# Patient Record
Sex: Female | Born: 1980 | Race: Black or African American | Hispanic: No | Marital: Married | Smoking: Never smoker
Health system: Southern US, Community
[De-identification: ages and names within clinical notes are randomized; demographics above are authoritative.]

## PROBLEM LIST (undated history)

## (undated) DIAGNOSIS — G43009 Migraine without aura, not intractable, without status migrainosus: Secondary | ICD-10-CM

## (undated) DIAGNOSIS — E785 Hyperlipidemia, unspecified: Secondary | ICD-10-CM

## (undated) DIAGNOSIS — K649 Unspecified hemorrhoids: Secondary | ICD-10-CM

## (undated) DIAGNOSIS — I10 Essential (primary) hypertension: Secondary | ICD-10-CM

## (undated) DIAGNOSIS — J309 Allergic rhinitis, unspecified: Secondary | ICD-10-CM

## (undated) DIAGNOSIS — R0602 Shortness of breath: Secondary | ICD-10-CM

## (undated) DIAGNOSIS — I499 Cardiac arrhythmia, unspecified: Secondary | ICD-10-CM

## (undated) DIAGNOSIS — L259 Unspecified contact dermatitis, unspecified cause: Secondary | ICD-10-CM

## (undated) DIAGNOSIS — F329 Major depressive disorder, single episode, unspecified: Secondary | ICD-10-CM

## (undated) DIAGNOSIS — T7840XA Allergy, unspecified, initial encounter: Secondary | ICD-10-CM

## (undated) DIAGNOSIS — D62 Acute posthemorrhagic anemia: Secondary | ICD-10-CM

## (undated) DIAGNOSIS — F411 Generalized anxiety disorder: Secondary | ICD-10-CM

## (undated) DIAGNOSIS — R12 Heartburn: Secondary | ICD-10-CM

## (undated) DIAGNOSIS — O26899 Other specified pregnancy related conditions, unspecified trimester: Secondary | ICD-10-CM

## (undated) DIAGNOSIS — K602 Anal fissure, unspecified: Secondary | ICD-10-CM

## (undated) HISTORY — DX: Allergy, unspecified, initial encounter: T78.40XA

## (undated) HISTORY — DX: Migraine without aura, not intractable, without status migrainosus: G43.009

## (undated) HISTORY — DX: Unspecified contact dermatitis, unspecified cause: L25.9

## (undated) HISTORY — DX: Unspecified hemorrhoids: K64.9

## (undated) HISTORY — DX: Generalized anxiety disorder: F41.1

## (undated) HISTORY — DX: Major depressive disorder, single episode, unspecified: F32.9

## (undated) HISTORY — DX: Hyperlipidemia, unspecified: E78.5

## (undated) HISTORY — DX: Anal fissure, unspecified: K60.2

## (undated) HISTORY — DX: Allergic rhinitis, unspecified: J30.9

## (undated) HISTORY — DX: Essential (primary) hypertension: I10

---

## 1998-01-15 ENCOUNTER — Emergency Department (HOSPITAL_COMMUNITY): Admission: EM | Admit: 1998-01-15 | Discharge: 1998-01-15 | Payer: Self-pay | Admitting: Emergency Medicine

## 1999-01-08 ENCOUNTER — Other Ambulatory Visit: Admission: RE | Admit: 1999-01-08 | Discharge: 1999-01-08 | Payer: Self-pay | Admitting: Internal Medicine

## 2000-01-10 ENCOUNTER — Other Ambulatory Visit: Admission: RE | Admit: 2000-01-10 | Discharge: 2000-01-10 | Payer: Self-pay | Admitting: Internal Medicine

## 2000-09-18 ENCOUNTER — Encounter: Payer: Self-pay | Admitting: Internal Medicine

## 2000-09-18 ENCOUNTER — Ambulatory Visit (HOSPITAL_COMMUNITY): Admission: RE | Admit: 2000-09-18 | Discharge: 2000-09-18 | Payer: Self-pay | Admitting: Internal Medicine

## 2000-12-22 ENCOUNTER — Other Ambulatory Visit: Admission: RE | Admit: 2000-12-22 | Discharge: 2000-12-22 | Payer: Self-pay | Admitting: Internal Medicine

## 2001-03-16 ENCOUNTER — Emergency Department (HOSPITAL_COMMUNITY): Admission: EM | Admit: 2001-03-16 | Discharge: 2001-03-16 | Payer: Self-pay | Admitting: Emergency Medicine

## 2001-05-21 ENCOUNTER — Other Ambulatory Visit: Admission: RE | Admit: 2001-05-21 | Discharge: 2001-05-21 | Payer: Self-pay | Admitting: Internal Medicine

## 2001-11-09 ENCOUNTER — Other Ambulatory Visit: Admission: RE | Admit: 2001-11-09 | Discharge: 2001-11-09 | Payer: Self-pay | Admitting: Internal Medicine

## 2003-01-15 ENCOUNTER — Emergency Department (HOSPITAL_COMMUNITY): Admission: EM | Admit: 2003-01-15 | Discharge: 2003-01-15 | Payer: Self-pay | Admitting: Emergency Medicine

## 2003-04-09 ENCOUNTER — Emergency Department (HOSPITAL_COMMUNITY): Admission: EM | Admit: 2003-04-09 | Discharge: 2003-04-10 | Payer: Self-pay | Admitting: Emergency Medicine

## 2003-10-21 ENCOUNTER — Ambulatory Visit: Payer: Self-pay | Admitting: Internal Medicine

## 2003-12-24 ENCOUNTER — Ambulatory Visit: Payer: Self-pay | Admitting: Internal Medicine

## 2004-08-02 ENCOUNTER — Ambulatory Visit: Payer: Self-pay | Admitting: Internal Medicine

## 2005-04-18 ENCOUNTER — Emergency Department (HOSPITAL_COMMUNITY): Admission: EM | Admit: 2005-04-18 | Discharge: 2005-04-18 | Payer: Self-pay | Admitting: Emergency Medicine

## 2006-08-18 ENCOUNTER — Ambulatory Visit: Payer: Self-pay | Admitting: Endocrinology

## 2007-04-03 ENCOUNTER — Ambulatory Visit: Payer: Self-pay | Admitting: Internal Medicine

## 2007-04-03 DIAGNOSIS — F329 Major depressive disorder, single episode, unspecified: Secondary | ICD-10-CM

## 2007-04-03 DIAGNOSIS — H1030 Unspecified acute conjunctivitis, unspecified eye: Secondary | ICD-10-CM | POA: Insufficient documentation

## 2007-04-03 DIAGNOSIS — J309 Allergic rhinitis, unspecified: Secondary | ICD-10-CM

## 2007-04-03 DIAGNOSIS — F3289 Other specified depressive episodes: Secondary | ICD-10-CM

## 2007-04-03 DIAGNOSIS — F321 Major depressive disorder, single episode, moderate: Secondary | ICD-10-CM | POA: Insufficient documentation

## 2007-04-03 DIAGNOSIS — I10 Essential (primary) hypertension: Secondary | ICD-10-CM | POA: Insufficient documentation

## 2007-04-03 DIAGNOSIS — F411 Generalized anxiety disorder: Secondary | ICD-10-CM

## 2007-04-03 DIAGNOSIS — J069 Acute upper respiratory infection, unspecified: Secondary | ICD-10-CM | POA: Insufficient documentation

## 2007-04-03 DIAGNOSIS — H6120 Impacted cerumen, unspecified ear: Secondary | ICD-10-CM

## 2007-04-03 HISTORY — DX: Essential (primary) hypertension: I10

## 2007-04-03 HISTORY — DX: Allergic rhinitis, unspecified: J30.9

## 2007-04-03 HISTORY — DX: Major depressive disorder, single episode, unspecified: F32.9

## 2007-04-03 HISTORY — DX: Generalized anxiety disorder: F41.1

## 2007-04-03 HISTORY — DX: Other specified depressive episodes: F32.89

## 2007-04-12 ENCOUNTER — Telehealth (INDEPENDENT_AMBULATORY_CARE_PROVIDER_SITE_OTHER): Payer: Self-pay | Admitting: *Deleted

## 2007-04-19 ENCOUNTER — Telehealth: Payer: Self-pay | Admitting: Internal Medicine

## 2007-05-02 ENCOUNTER — Telehealth (INDEPENDENT_AMBULATORY_CARE_PROVIDER_SITE_OTHER): Payer: Self-pay | Admitting: *Deleted

## 2007-05-17 ENCOUNTER — Telehealth: Payer: Self-pay | Admitting: Internal Medicine

## 2007-05-18 ENCOUNTER — Ambulatory Visit: Payer: Self-pay | Admitting: Internal Medicine

## 2007-05-18 DIAGNOSIS — J019 Acute sinusitis, unspecified: Secondary | ICD-10-CM

## 2007-05-31 ENCOUNTER — Telehealth (INDEPENDENT_AMBULATORY_CARE_PROVIDER_SITE_OTHER): Payer: Self-pay | Admitting: *Deleted

## 2007-06-29 ENCOUNTER — Telehealth (INDEPENDENT_AMBULATORY_CARE_PROVIDER_SITE_OTHER): Payer: Self-pay | Admitting: *Deleted

## 2007-09-21 ENCOUNTER — Ambulatory Visit: Payer: Self-pay | Admitting: Internal Medicine

## 2007-09-21 ENCOUNTER — Encounter (INDEPENDENT_AMBULATORY_CARE_PROVIDER_SITE_OTHER): Payer: Self-pay | Admitting: *Deleted

## 2007-09-21 ENCOUNTER — Telehealth (INDEPENDENT_AMBULATORY_CARE_PROVIDER_SITE_OTHER): Payer: Self-pay | Admitting: *Deleted

## 2007-09-25 ENCOUNTER — Telehealth (INDEPENDENT_AMBULATORY_CARE_PROVIDER_SITE_OTHER): Payer: Self-pay | Admitting: *Deleted

## 2007-09-26 ENCOUNTER — Telehealth (INDEPENDENT_AMBULATORY_CARE_PROVIDER_SITE_OTHER): Payer: Self-pay | Admitting: *Deleted

## 2007-09-26 ENCOUNTER — Ambulatory Visit: Payer: Self-pay | Admitting: Internal Medicine

## 2007-10-17 ENCOUNTER — Ambulatory Visit: Payer: Self-pay | Admitting: Internal Medicine

## 2007-10-18 LAB — CONVERTED CEMR LAB
ALT: 18 units/L (ref 0–35)
AST: 27 units/L (ref 0–37)
Albumin: 4.1 g/dL (ref 3.5–5.2)
Alkaline Phosphatase: 50 units/L (ref 39–117)
BUN: 14 mg/dL (ref 6–23)
Basophils Absolute: 0 10*3/uL (ref 0.0–0.1)
Basophils Relative: 0.4 % (ref 0.0–3.0)
Bilirubin Urine: NEGATIVE
Bilirubin, Direct: 0.2 mg/dL (ref 0.0–0.3)
CO2: 29 meq/L (ref 19–32)
Calcium: 9.4 mg/dL (ref 8.4–10.5)
Chloride: 103 meq/L (ref 96–112)
Cholesterol: 201 mg/dL (ref 0–200)
Creatinine, Ser: 0.8 mg/dL (ref 0.4–1.2)
Direct LDL: 109.3 mg/dL
Eosinophils Absolute: 0.2 10*3/uL (ref 0.0–0.7)
Eosinophils Relative: 4.3 % (ref 0.0–5.0)
GFR calc Af Amer: 109 mL/min
GFR calc non Af Amer: 90 mL/min
Glucose, Bld: 99 mg/dL (ref 70–99)
HCT: 36.8 % (ref 36.0–46.0)
HDL: 54.8 mg/dL (ref >39.0)
Hemoglobin: 12.6 g/dL (ref 12.0–15.0)
Ketones, ur: NEGATIVE mg/dL
Leukocytes, UA: NEGATIVE
Lymphocytes Relative: 45.3 % (ref 12.0–46.0)
MCHC: 34.1 g/dL (ref 30.0–36.0)
MCV: 94.9 fL (ref 78.0–100.0)
Monocytes Absolute: 0.4 10*3/uL (ref 0.1–1.0)
Monocytes Relative: 8.2 % (ref 3.0–12.0)
Neutro Abs: 2.1 10*3/uL (ref 1.4–7.7)
Neutrophils Relative %: 41.8 % — ABNORMAL LOW (ref 43.0–77.0)
Nitrite: NEGATIVE
Platelets: 218 10*3/uL (ref 150–400)
Potassium: 3.5 meq/L (ref 3.5–5.1)
RBC: 3.88 M/uL (ref 3.87–5.11)
RDW: 12.6 % (ref 11.5–14.6)
Sodium: 139 meq/L (ref 135–145)
Specific Gravity, Urine: 1.01 (ref 1.000–1.03)
TSH: 0.88 microintl units/mL (ref 0.35–5.50)
Total Bilirubin: 0.8 mg/dL (ref 0.3–1.2)
Total CHOL/HDL Ratio: 3.7
Total Protein, Urine: NEGATIVE mg/dL
Total Protein: 7.1 g/dL (ref 6.0–8.3)
Triglycerides: 94 mg/dL (ref 0–149)
Urine Glucose: NEGATIVE mg/dL
Urobilinogen, UA: 0.2 (ref 0.0–1.0)
VLDL: 19 mg/dL (ref 0–40)
WBC: 5.1 10*3/uL (ref 4.5–10.5)
pH: 7.5 (ref 5.0–8.0)

## 2007-11-02 ENCOUNTER — Encounter: Payer: Self-pay | Admitting: Internal Medicine

## 2007-11-02 ENCOUNTER — Ambulatory Visit: Payer: Self-pay

## 2007-11-16 ENCOUNTER — Ambulatory Visit: Payer: Self-pay | Admitting: Internal Medicine

## 2008-07-04 ENCOUNTER — Inpatient Hospital Stay (HOSPITAL_COMMUNITY): Admission: AD | Admit: 2008-07-04 | Discharge: 2008-07-08 | Payer: Self-pay | Admitting: Obstetrics and Gynecology

## 2008-07-05 ENCOUNTER — Encounter (INDEPENDENT_AMBULATORY_CARE_PROVIDER_SITE_OTHER): Payer: Self-pay | Admitting: Obstetrics and Gynecology

## 2008-09-23 ENCOUNTER — Ambulatory Visit: Payer: Self-pay | Admitting: Internal Medicine

## 2008-09-23 DIAGNOSIS — R209 Unspecified disturbances of skin sensation: Secondary | ICD-10-CM | POA: Insufficient documentation

## 2008-11-19 ENCOUNTER — Ambulatory Visit: Payer: Self-pay | Admitting: Internal Medicine

## 2008-11-19 DIAGNOSIS — K6289 Other specified diseases of anus and rectum: Secondary | ICD-10-CM | POA: Insufficient documentation

## 2008-12-11 ENCOUNTER — Ambulatory Visit: Payer: Self-pay | Admitting: Internal Medicine

## 2008-12-12 LAB — CONVERTED CEMR LAB
ALT: 12 units/L (ref 0–35)
AST: 18 units/L (ref 0–37)
Albumin: 4.1 g/dL (ref 3.5–5.2)
Alkaline Phosphatase: 46 units/L (ref 39–117)
BUN: 12 mg/dL (ref 6–23)
Basophils Absolute: 0 10*3/uL (ref 0.0–0.1)
Basophils Relative: 0.2 % (ref 0.0–3.0)
Bilirubin Urine: NEGATIVE
Bilirubin, Direct: 0.1 mg/dL (ref 0.0–0.3)
CO2: 31 meq/L (ref 19–32)
Calcium: 9.2 mg/dL (ref 8.4–10.5)
Chloride: 101 meq/L (ref 96–112)
Cholesterol: 196 mg/dL (ref 0–200)
Creatinine, Ser: 0.8 mg/dL (ref 0.4–1.2)
Eosinophils Absolute: 0.1 10*3/uL (ref 0.0–0.7)
Eosinophils Relative: 1.5 % (ref 0.0–5.0)
GFR calc non Af Amer: 107.78 mL/min (ref >60)
Glucose, Bld: 87 mg/dL (ref 70–99)
HCT: 36.3 % (ref 36.0–46.0)
HDL: 49.1 mg/dL (ref >39.00)
Hemoglobin, Urine: NEGATIVE
Hemoglobin: 12.4 g/dL (ref 12.0–15.0)
Ketones, ur: NEGATIVE mg/dL
LDL Cholesterol: 135 mg/dL — ABNORMAL HIGH (ref 0–99)
Leukocytes, UA: NEGATIVE
Lymphocytes Relative: 20.5 % (ref 12.0–46.0)
Lymphs Abs: 1.4 10*3/uL (ref 0.7–4.0)
MCHC: 34.2 g/dL (ref 30.0–36.0)
MCV: 94.3 fL (ref 78.0–100.0)
Monocytes Absolute: 0.5 10*3/uL (ref 0.1–1.0)
Monocytes Relative: 7.1 % (ref 3.0–12.0)
Neutro Abs: 4.9 10*3/uL (ref 1.4–7.7)
Neutrophils Relative %: 70.7 % (ref 43.0–77.0)
Nitrite: NEGATIVE
Platelets: 203 10*3/uL (ref 150.0–400.0)
Potassium: 3.2 meq/L — ABNORMAL LOW (ref 3.5–5.1)
RBC: 3.85 M/uL — ABNORMAL LOW (ref 3.87–5.11)
RDW: 12.7 % (ref 11.5–14.6)
Sodium: 139 meq/L (ref 135–145)
Specific Gravity, Urine: 1.025 (ref 1.000–1.030)
TSH: 0.69 microintl units/mL (ref 0.35–5.50)
Total Bilirubin: 1 mg/dL (ref 0.3–1.2)
Total CHOL/HDL Ratio: 4
Total Protein, Urine: NEGATIVE mg/dL
Total Protein: 7.1 g/dL (ref 6.0–8.3)
Triglycerides: 59 mg/dL (ref 0.0–149.0)
Urine Glucose: NEGATIVE mg/dL
Urobilinogen, UA: 0.2 (ref 0.0–1.0)
VLDL: 11.8 mg/dL (ref 0.0–40.0)
WBC: 6.9 10*3/uL (ref 4.5–10.5)
pH: 6 (ref 5.0–8.0)

## 2009-01-06 ENCOUNTER — Telehealth: Payer: Self-pay | Admitting: Internal Medicine

## 2009-03-19 ENCOUNTER — Ambulatory Visit: Payer: Self-pay | Admitting: Internal Medicine

## 2009-03-19 DIAGNOSIS — L259 Unspecified contact dermatitis, unspecified cause: Secondary | ICD-10-CM

## 2009-03-19 DIAGNOSIS — L309 Dermatitis, unspecified: Secondary | ICD-10-CM

## 2009-03-19 HISTORY — DX: Unspecified contact dermatitis, unspecified cause: L25.9

## 2009-03-24 ENCOUNTER — Encounter: Payer: Self-pay | Admitting: Internal Medicine

## 2009-03-24 ENCOUNTER — Telehealth: Payer: Self-pay | Admitting: Internal Medicine

## 2009-09-03 ENCOUNTER — Ambulatory Visit: Payer: Self-pay | Admitting: Internal Medicine

## 2009-09-16 ENCOUNTER — Telehealth: Payer: Self-pay | Admitting: Internal Medicine

## 2009-09-22 ENCOUNTER — Telehealth: Payer: Self-pay | Admitting: Internal Medicine

## 2009-12-09 ENCOUNTER — Ambulatory Visit: Payer: Self-pay | Admitting: Internal Medicine

## 2009-12-09 DIAGNOSIS — E785 Hyperlipidemia, unspecified: Secondary | ICD-10-CM | POA: Insufficient documentation

## 2009-12-09 DIAGNOSIS — M6740 Ganglion, unspecified site: Secondary | ICD-10-CM | POA: Insufficient documentation

## 2009-12-09 DIAGNOSIS — G43009 Migraine without aura, not intractable, without status migrainosus: Secondary | ICD-10-CM | POA: Insufficient documentation

## 2009-12-09 HISTORY — DX: Hyperlipidemia, unspecified: E78.5

## 2009-12-09 HISTORY — DX: Migraine without aura, not intractable, without status migrainosus: G43.009

## 2009-12-17 ENCOUNTER — Ambulatory Visit: Payer: Self-pay | Admitting: Internal Medicine

## 2010-01-01 LAB — CONVERTED CEMR LAB
AST: 19 units/L (ref 0–37)
Albumin: 4.2 g/dL (ref 3.5–5.2)
BUN: 14 mg/dL (ref 6–23)
Basophils Relative: 0.6 % (ref 0.0–3.0)
Bilirubin Urine: NEGATIVE
Cholesterol: 196 mg/dL (ref 0–200)
Creatinine, Ser: 0.6 mg/dL (ref 0.4–1.2)
Eosinophils Absolute: 0.1 10*3/uL (ref 0.0–0.7)
Eosinophils Relative: 2.4 % (ref 0.0–5.0)
GFR calc non Af Amer: 152.1 mL/min (ref >60.00)
Glucose, Bld: 77 mg/dL (ref 70–99)
HCT: 37.2 % (ref 36.0–46.0)
HDL: 49.1 mg/dL (ref >39.00)
Hemoglobin: 12.9 g/dL (ref 12.0–15.0)
Lymphs Abs: 1.4 10*3/uL (ref 0.7–4.0)
MCHC: 34.6 g/dL (ref 30.0–36.0)
MCV: 96 fL (ref 78.0–100.0)
Monocytes Absolute: 0.3 10*3/uL (ref 0.1–1.0)
Neutro Abs: 3.4 10*3/uL (ref 1.4–7.7)
Neutrophils Relative %: 65.1 % (ref 43.0–77.0)
Nitrite: NEGATIVE
Potassium: 3.6 meq/L (ref 3.5–5.1)
RBC: 3.88 M/uL (ref 3.87–5.11)
TSH: 0.96 microintl units/mL (ref 0.35–5.50)
Total Bilirubin: 0.8 mg/dL (ref 0.3–1.2)
Total Protein, Urine: NEGATIVE mg/dL
Urine Glucose: NEGATIVE mg/dL
WBC: 5.2 10*3/uL (ref 4.5–10.5)
pH: 6.5 (ref 5.0–8.0)

## 2010-02-02 NOTE — Progress Notes (Signed)
Summary: Still sick  Phone Note Call from Patient   Caller: Patient [phone] Summary of Call: Pt called stating that she is still feeling sick, pt has throat pain and is begining to lose her voice. Pt is requesting MD's advisement, does she need another ABX? Please advise Initial call taken by: Margaret Pyle, CMA,  September 22, 2009 12:00 PM  Follow-up for Phone Call        if no fever, can hold on further antibx at this time, consider throat lozenges, cont the allergy meds, and consider re-eval if not improved in another wk or so, or if fever, sinus pain, cough or chest congestion or wheezing Follow-up by: Corwin Levins MD,  September 22, 2009 12:36 PM  Additional Follow-up for Phone Call Additional follow up Details #1::        Pt informed and will re-start allergy meds and monitor for fever, cough, congestion, wheezing or sinus pain. Additional Follow-up by: Margaret Pyle, CMA,  September 22, 2009 2:35 PM

## 2010-02-02 NOTE — Medication Information (Signed)
Summary: Approved/[facility]  Approved/[facility]   Imported By: Lester [place] 04/07/2009 09:54:01  _____________________________________________________________________  External Attachment:    Type:   Image     Comment:   External Document

## 2010-02-02 NOTE — Assessment & Plan Note (Signed)
Summary: LARYINGITIS/NWS   Vital Signs:  Patient profile:   30 year old female Height:      62 inches Weight:      125.38 pounds BMI:     23.02 O2 Sat:      98 % on Room air Temp:     99.2 degrees F oral Pulse rate:   83 / minute BP sitting:   118 / 84  (left arm) Cuff size:   regular  Vitals Entered By: Zella Ball Ewing CMA (AAMA) (September 03, 2009 2:12 PM)  O2 Flow:  Room air CC: tonsils swollen and painful, lost voice, cough for 4 days/RE   CC:  tonsils swollen and painful, lost voice, and cough for 4 days/RE.  History of Present Illness: here with acute onset mild to mod ST for 4 days with fever, general weakness, slight cough after exposure to several sick contacts;  has ongong nasal allergy symptoms but well controlled recently and good complaince wit meds;  Pt denies CP, worsening sob, doe, wheezing, orthopnea, pnd, worsening LE edema, palps, dizziness or syncope  Pt denies new neuro symptoms such as headache, facial or extremity weakness  No  wt loss, night sweats, loss of appetite or other constitutional symptoms   Problems Prior to Update: 1)  Pharyngitis-acute  (ICD-462) 2)  Eczema  (ICD-692.9) 3)  Preventive Health Care  (ICD-V70.0) 4)  Sinusitis- Acute-nos  (ICD-461.9) 5)  Rectal Pain  (ICD-569.42) 6)  Paresthesia  (ICD-782.0) 7)  Sinusitis- Acute-nos  (ICD-461.9) 8)  Preventive Health Care  (ICD-V70.0) 9)  Uri  (ICD-465.9) 10)  Sinusitis- Acute-nos  (ICD-461.9) 11)  Allergic Rhinitis  (ICD-477.9) 12)  Depression  (ICD-311) 13)  Anxiety  (ICD-300.00) 14)  Cerumen Impaction, Left  (ICD-380.4) 15)  Uri  (ICD-465.9) 16)  Conjunctivitis, Acute  (ICD-372.00) 17)  Hypertension  (ICD-401.9)  Medications Prior to Update: 1)  Labetalol Hcl 300 Mg Tabs (Labetalol Hcl) .Marland Kitchen.. 1 By Mouth Three Times A Day 2)  Errin 0.35 Mg Tabs (Norethindrone (Contraceptive)) .Marland Kitchen.. 1 By Mouth Once Daily 3)  Fexofenadine Hcl 180 Mg Tabs (Fexofenadine Hcl) .Marland Kitchen.. 1po Once Daily As Needed  Allergies 4)  Daily Vitamins  Tabs (Multiple Vitamin) .Marland Kitchen.. 1 By Mouth Once Daily 5)  Hydrochlorothiazide 25 Mg Tabs (Hydrochlorothiazide) .Marland Kitchen.. 1 By Mouth Once Daily 6)  Azithromycin 250 Mg Tabs (Azithromycin) .... 2po Qd For 1 Day, Then 1po Qd For 4days, Then Stop 7)  Klor-Con 10 10 Meq Cr-Tabs (Potassium Chloride) .Marland Kitchen.. 1 By Mouth Once Daily 8)  Triamcinolone Acetonide 0.1 % Crea (Triamcinolone Acetonide) .... Use Asd Once Daily As Needed  Current Medications (verified): 1)  Labetalol Hcl 300 Mg Tabs (Labetalol Hcl) .Marland Kitchen.. 1 By Mouth Three Times A Day 2)  Errin 0.35 Mg Tabs (Norethindrone (Contraceptive)) .Marland Kitchen.. 1 By Mouth Once Daily 3)  Fexofenadine Hcl 180 Mg Tabs (Fexofenadine Hcl) .Marland Kitchen.. 1po Once Daily As Needed Allergies 4)  Daily Vitamins  Tabs (Multiple Vitamin) .Marland Kitchen.. 1 By Mouth Once Daily 5)  Hydrochlorothiazide 25 Mg Tabs (Hydrochlorothiazide) .Marland Kitchen.. 1 By Mouth Once Daily 6)  Doxycycline Hyclate 100 Mg Caps (Doxycycline Hyclate) .Marland Kitchen.. 1po Two Times A Day 7)  Klor-Con 10 10 Meq Cr-Tabs (Potassium Chloride) .Marland Kitchen.. 1 By Mouth Once Daily 8)  Triamcinolone Acetonide 0.1 % Crea (Triamcinolone Acetonide) .... Use Asd Once Daily As Needed  Allergies (verified): 1)  ! * Peanut Butter 2)  ! * Crab Legs 3)  ! * Blue Cheeze 4)  ! 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  , mild ill  Head:  normocephalic and atraumatic.   Eyes:  vision grossly intact, pupils equal, and pupils round.   Ears:  bilat tm's erythema, sinus nontender Nose:  nasal dischargemucosal  pallor and mucosal edema.   Mouth:  pharyngeal erythema and pharyngeal exudate.   Neck:  supple and cervical lymphadenopathy.   Lungs:  normal respiratory effort and normal breath sounds.   Heart:  normal rate and regular rhythm.   Extremities:  no edema, no erythema    Impression & Recommendations:  Problem # 1:  PHARYNGITIS-ACUTE (ICD-462)  Her updated medication list for this problem includes:    Doxycycline Hyclate 100 Mg Caps (Doxycycline hyclate) .Marland Kitchen... 1po two times a day severe exudative, treat as above, f/u any worsening signs or symptoms , ok for work note for tomorrow  Problem # 2:  HYPERTENSION (ICD-401.9)  Her updated medication list for this problem includes:    Labetalol Hcl 300 Mg Tabs (Labetalol hcl) .Marland Kitchen... 1 by mouth three times a day    Hydrochlorothiazide 25 Mg Tabs (Hydrochlorothiazide) .Marland Kitchen... 1 by mouth once daily  BP today: 118/84 Prior BP: 112/80 (03/19/2009)  Labs Reviewed: K+: 3.2 (12/11/2008) Creat: : 0.8 (12/11/2008)   Chol: 196 (12/11/2008)   HDL: 49.10 (12/11/2008)   LDL: 135 (12/11/2008)   TG: 59.0 (12/11/2008) stable overall by hx and exam, ok to continue meds/tx as is   Problem # 3:  ALLERGIC RHINITIS (ICD-477.9)  Her updated medication list for this problem includes:    Fexofenadine Hcl 180 Mg Tabs (Fexofenadine hcl) .Marland Kitchen... 1po once daily as needed allergies stable overall by hx and exam, ok to continue meds/tx as is   Complete Medication List: 1)  Labetalol Hcl 300 Mg Tabs (Labetalol hcl) .Marland Kitchen.. 1 by mouth three times a day 2)  Errin 0.35 Mg Tabs (Norethindrone (contraceptive)) .Marland Kitchen.. 1 by mouth once daily 3)  Fexofenadine Hcl 180 Mg Tabs (Fexofenadine hcl) .Marland Kitchen.. 1po once daily as needed allergies 4)  Daily Vitamins Tabs (Multiple vitamin) .Marland Kitchen.. 1 by mouth once daily 5)  Hydrochlorothiazide 25 Mg Tabs (Hydrochlorothiazide) .Marland Kitchen.. 1 by mouth once daily 6)  Doxycycline Hyclate 100 Mg Caps (Doxycycline hyclate) .Marland Kitchen.. 1po two times a day 7)  Klor-con 10 10  Meq Cr-tabs (Potassium chloride) .Marland Kitchen.. 1 by mouth once daily 8)  Triamcinolone Acetonide 0.1 % Crea (Triamcinolone acetonide) .... Use asd once daily as needed  Patient Instructions: 1)  Please take all new medications as prescribed 2)  Continue all previous medications as before this visit  3)  You are given the work note today 4)  Please schedule a follow-up appointment as needed. Prescriptions: DOXYCYCLINE HYCLATE 100 MG CAPS (DOXYCYCLINE HYCLATE) 1po two times a day  #20 x 0   Entered and Authorized by:   Corwin Levins MD   Signed by:   Corwin Levins MD on 09/03/2009   Method used:   Print then Give to Patient   RxID:   [id number]

## 2010-02-02 NOTE — Assessment & Plan Note (Signed)
Summary: SWOLLEN TONSILS/SORE THROAT/COUGH/CONGESTION/LB   Vital Signs:  Patient profile:   30 year old female Height:      62 inches Weight:      128.25 pounds BMI:     23.54 O2 Sat:      98 % on Room air Temp:     98.3 degrees F oral Pulse rate:   91 / minute BP sitting:   112 / 80  (left arm) Cuff size:   regular  Vitals Entered ByZella Ball Ewing (March 19, 2009 11:33 AM)  O2 Flow:  Room air CC: congestion, cough, tonsils swollen/RE   CC:  congestion, cough, and tonsils swollen/RE.  History of Present Illness: here with 2 to 3 days acute onset sever ST with slight cough and fever, but also 2 to 3 wks onset mild to mod nasal allergy congestion and drainage;  Pt denies CP, sob, doe, wheezing, orthopnea, pnd, worsening LE edema, palps, dizziness or syncope   Pt denies new neuro symptoms such as headache, facial or extremity weakness  But also with new exczema type rash to hands with itching. for 3 wks as well.  Still teaching, several students ill recently on antibx.  Problems Prior to Update: 1)  Eczema  (ICD-692.9) 2)  Pharyngitis-acute  (ICD-462) 3)  Preventive Health Care  (ICD-V70.0) 4)  Sinusitis- Acute-nos  (ICD-461.9) 5)  Rectal Pain  (ICD-569.42) 6)  Paresthesia  (ICD-782.0) 7)  Sinusitis- Acute-nos  (ICD-461.9) 8)  Preventive Health Care  (ICD-V70.0) 9)  Uri  (ICD-465.9) 10)  Sinusitis- Acute-nos  (ICD-461.9) 11)  Allergic Rhinitis  (ICD-477.9) 12)  Depression  (ICD-311) 13)  Anxiety  (ICD-300.00) 14)  Cerumen Impaction, Left  (ICD-380.4) 15)  Uri  (ICD-465.9) 16)  Conjunctivitis, Acute  (ICD-372.00) 17)  Hypertension  (ICD-401.9)  Medications Prior to Update: 1)  Labetalol Hcl 300 Mg Tabs (Labetalol Hcl) .Marland Kitchen.. 1 By Mouth Three Times A Day 2)  Errin 0.35 Mg Tabs (Norethindrone (Contraceptive)) .Marland Kitchen.. 1 By Mouth Once Daily 3)  Zyrtec Allergy 10 Mg Tabs (Cetirizine Hcl) .Marland Kitchen.. 1 By Mouth As Needed 4)  Daily Vitamins  Tabs (Multiple Vitamin) .Marland Kitchen.. 1 By Mouth Once  Daily 5)  Hydrochlorothiazide 25 Mg Tabs (Hydrochlorothiazide) .Marland Kitchen.. 1 By Mouth Once Daily 6)  Cephalexin 500 Mg Caps (Cephalexin) .Marland Kitchen.. 1 By Mouth Three Times A Day 7)  Klor-Con 10 10 Meq Cr-Tabs (Potassium Chloride) .Marland Kitchen.. 1 By Mouth Once Daily 8)  Anusol-Hc 25 Mg Supp (Hydrocortisone Acetate) .... Use As Directed  Current Medications (verified): 1)  Labetalol Hcl 300 Mg Tabs (Labetalol Hcl) .Marland Kitchen.. 1 By Mouth Three Times A Day 2)  Errin 0.35 Mg Tabs (Norethindrone (Contraceptive)) .Marland Kitchen.. 1 By Mouth Once Daily 3)  Fexofenadine Hcl 180 Mg Tabs (Fexofenadine Hcl) .Marland Kitchen.. 1po Once Daily As Needed Allergies 4)  Daily Vitamins  Tabs (Multiple Vitamin) .Marland Kitchen.. 1 By Mouth Once Daily 5)  Hydrochlorothiazide 25 Mg Tabs (Hydrochlorothiazide) .Marland Kitchen.. 1 By Mouth Once Daily 6)  Azithromycin 250 Mg Tabs (Azithromycin) .... 2po Qd For 1 Day, Then 1po Qd For 4days, Then Stop 7)  Klor-Con 10 10 Meq Cr-Tabs (Potassium Chloride) .Marland Kitchen.. 1 By Mouth Once Daily 8)  Triamcinolone Acetonide 0.1 % Crea (Triamcinolone Acetonide) .... Use Asd Once Daily As Needed  Allergies (verified): 1)  ! * Peanut Butter 2)  ! * Crab Legs 3)  ! * Blue Cheeze 4)  ! 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  , mild ill  Head:  normocephalic and atraumatic.   Eyes:  vision grossly intact, pupils equal, and pupils round.   Ears:  bilat tm's red, sinus nontender Nose:  nasal dischargemucosal pallor and mucosal edema.   Mouth:  pharyngeal erythema, fair dentition, and pharyngeal exudate.   Neck:  supple and  cervical lymphadenopathy.   Lungs:  normal respiratory effort and normal breath sounds.   Heart:  normal rate and regular rhythm.   Extremities:  no edema, no erythema  Skin:  color normal.  but eczema noted to hands Psych:  not depressed appearing and slightly anxious.     Impression & Recommendations:  Problem # 1:  PHARYNGITIS-ACUTE (ICD-462)  Her updated medication list for this problem includes:    Azithromycin 250 Mg Tabs (Azithromycin) .Marland Kitchen... 2po qd for 1 day, then 1po qd for 4days, then stop treat as above, f/u any worsening signs or symptoms   Problem # 2:  ALLERGIC RHINITIS (ICD-477.9)  Her updated medication list for this problem includes:    Fexofenadine Hcl 180 Mg Tabs (Fexofenadine hcl) .Marland Kitchen... 1po once daily as needed allergies treat as above, f/u any worsening signs or symptoms   Problem # 3:  HYPERTENSION (ICD-401.9)  Her updated medication list for this problem includes:    Labetalol Hcl 300 Mg Tabs (Labetalol hcl) .Marland Kitchen... 1 by mouth three times a day    Hydrochlorothiazide 25 Mg Tabs (Hydrochlorothiazide) .Marland Kitchen... 1 by mouth once daily  BP today: 112/80 Prior BP: 142/90 (12/11/2008)  Labs Reviewed: K+: 3.2 (12/11/2008) Creat: : 0.8 (12/11/2008)   Chol: 196 (12/11/2008)   HDL: 49.10 (12/11/2008)   LDL: 135 (12/11/2008)   TG: 59.0 (12/11/2008) stable overall by hx and exam, ok to continue meds/tx as is   Problem # 4:  ECZEMA (ICD-692.9)  Her updated medication list for this problem includes:    Fexofenadine Hcl 180 Mg Tabs (Fexofenadine hcl) .Marland Kitchen... 1po once daily as needed allergies    Triamcinolone Acetonide 0.1 % Crea (Triamcinolone acetonide) ..... Use asd once daily as needed treat as above, f/u any worsening signs or symptoms   Complete Medication List: 1)  Labetalol Hcl 300 Mg Tabs (Labetalol hcl) .Marland Kitchen.. 1 by mouth three times a day 2)  Errin 0.35 Mg Tabs (Norethindrone (contraceptive)) .Marland Kitchen.. 1 by mouth once daily 3)  Fexofenadine Hcl 180 Mg Tabs (Fexofenadine  hcl) .Marland Kitchen.. 1po once daily as needed allergies 4)  Daily Vitamins Tabs (Multiple vitamin) .Marland Kitchen.. 1 by mouth once daily 5)  Hydrochlorothiazide 25 Mg Tabs (Hydrochlorothiazide) .Marland Kitchen.. 1 by mouth once daily 6)  Azithromycin 250 Mg Tabs (Azithromycin) .... 2po qd for 1 day, then 1po qd for 4days, then stop 7)  Klor-con 10 10 Meq Cr-tabs (Potassium chloride) .Marland Kitchen.. 1 by mouth once daily 8)  Triamcinolone Acetonide 0.1 % Crea (Triamcinolone acetonide) .... Use asd once daily as needed  Patient Instructions: 1)  Please take all new medications as prescribed 2)  Continue all previous medications as before this visit  3)  ,You can also use Mucinex OTC or it's generic for congestion  4)  Please schedule a follow-up appointment in Dec 2011 with CPX labs Prescriptions: TRIAMCINOLONE ACETONIDE  0.1 % CREA (TRIAMCINOLONE ACETONIDE) use asd once daily as needed  #1 x 1   Entered and Authorized by:   Corwin Levins MD   Signed by:   Corwin Levins MD on 03/19/2009   Method used:   Electronically to        [facility] (retail)       [address]       [place], [place]  [address]       Ph: [phone]       Fax: [phone]   RxID:   [id number] FEXOFENADINE HCL 180 MG TABS (FEXOFENADINE HCL) 1po once daily as needed allergies  #30 x 11   Entered and Authorized by:   Corwin Levins MD   Signed by:   Corwin Levins MD on 03/19/2009   Method used:   Print then Give to Patient   RxID:   [id number] AZITHROMYCIN 250 MG TABS (AZITHROMYCIN) 2po qd for 1 day, then 1po qd for 4days, then stop  #6 x 1   Entered and Authorized by:   Corwin Levins MD   Signed by:   Corwin Levins MD on 03/19/2009   Method used:   Print then Give to Patient   RxID:   [id number]

## 2010-02-02 NOTE — Progress Notes (Signed)
Summary: hemorrhoids  Phone Note Call from Patient Call back at [phone]   Caller: Patient Reason for Call: Talk to Doctor Summary of Call: Md rx anusol cream for hemorrhoids. Pt states drections was not on prescription. How often can she use, and also is there any recommendations she can do to help relieve sxs. Initial call taken by: Orlan Leavens RMA,  September 16, 2009 4:11 PM  Follow-up for Phone Call        ok to take colace 100 two times a day as needed stool softner, and the cream at two times a day as needed ;  I dont have anything else to offer except for tylenol/advil for discomfort Follow-up by: Corwin Levins MD,  September 16, 2009 4:19 PM  Additional Follow-up for Phone Call Additional follow up Details #1::        Notified pt with md response Additional Follow-up by: Orlan Leavens RMA,  September 16, 2009 4:26 PM

## 2010-02-02 NOTE — Letter (Signed)
Summary: Out of Work  [facility]  [address] [place], [place] [address]   Phone: [phone]  Fax: [phone]    September 03, 2009   Employee:  Kristie Nelson    To Whom It May Concern:   For Medical reasons, please excuse the above named employee from work for the following dates:  Start:   Sept 2, 2011  End:   Sept 2 2011  If you need additional information, please feel free to contact our office.         Sincerely,    Corwin Levins MD

## 2010-02-02 NOTE — Progress Notes (Signed)
Summary: Fexofenadine PA  Phone Note From Pharmacy   Caller: [facility] Summary of Call: PA request--Fexofenadine. prior authorization was available online, completed, and approved until 2012. Initial call taken by: Lucious Groves,  March 24, 2009 3:11 PM  Follow-up for Phone Call        noted  please inform pt Follow-up by: Corwin Levins MD,  March 24, 2009 4:56 PM

## 2010-02-02 NOTE — Assessment & Plan Note (Signed)
Summary: HEADACHES/ NWS #   Vital Signs:  Patient profile:   30 year old female Height:      62 inches Weight:      127 pounds BMI:     23.31 O2 Sat:      97 % on Room air Temp:     98.6 degrees F oral Pulse rate:   96 / minute BP sitting:   118 / 80  (left arm) Cuff size:   regular  Vitals Entered By: Zella Ball Ewing CMA (AAMA) (December 09, 2009 8:16 AM)  O2 Flow:  Room air  Preventive Care Screening  Pap Smear:    Date:  04/03/2009    Results:  normal   Last Tetanus Booster:    Date:  01/04/2004    Results:  Tdap   CC: headaches/RE   CC:  headaches/RE.  History of Present Illness: here for f/u  - c/o headache intermittent daily over the past 10 days, mild, makes her notice and a bit irritated; dull pain, bifrontal , throbbing on occasion , worse just to turn the head, no blurred vision, occasional nausea that usually gets better to eat;  no related to caffeine,  no vomiting, and no fever, earache, ST, cough and Pt denies  worsening sob, doe, wheezing, orthopnea, pnd, worsening LE edema, palps, dizziness or syncope,  but does have occasionaal SSCP, intemitterment, ?pressure like, no radition, no diaphoresis, adn no n/v, lasts few seconds only, tends to recur 2 -3 times per day, some days at a time.  headaches tend to last more like an hour.  Pt denies other new neuro symptoms such as  facial or extremity weakness  .  tyelno helps headache but sometimes has to use the excedrin migraine.  No prior cns imaging.  Switched positions at work - worse move from her perspective, but she needs the job. Denies worsening depressive symptoms, suicidal ideation, or panic.  No ortho symptoms except for left wrist with tender knot that comes adn goes.  Pt states good ability with ADL's, low fall risk, home safety reviewed and adequate, no significant change in hearing or vision, trying to follow lower chol diet, and occasionally active only with regular excercise.   Preventive Screening-Counseling  & Management      Drug Use:  no.    Problems Prior to Update: 1)  Hyperlipidemia  (ICD-272.4) 2)  Ganglion Cyst, Wrist, Left  (ICD-727.41) 3)  Migraine, Common  (ICD-346.10) 4)  Eczema  (ICD-692.9) 5)  Preventive Health Care  (ICD-V70.0) 6)  Sinusitis- Acute-nos  (ICD-461.9) 7)  Rectal Pain  (ICD-569.42) 8)  Paresthesia  (ICD-782.0) 9)  Sinusitis- Acute-nos  (ICD-461.9) 10)  Preventive Health Care  (ICD-V70.0) 11)  Uri  (ICD-465.9) 12)  Sinusitis- Acute-nos  (ICD-461.9) 13)  Allergic Rhinitis  (ICD-477.9) 14)  Depression  (ICD-311) 15)  Anxiety  (ICD-300.00) 16)  Cerumen Impaction, Left  (ICD-380.4) 17)  Uri  (ICD-465.9) 18)  Conjunctivitis, Acute  (ICD-372.00) 19)  Hypertension  (ICD-401.9)  Medications Prior to Update: 1)  Labetalol Hcl 300 Mg Tabs (Labetalol Hcl) .Marland Kitchen.. 1 By Mouth Three Times A Day 2)  Errin 0.35 Mg Tabs (Norethindrone (Contraceptive)) .Marland Kitchen.. 1 By Mouth Once Daily 3)  Fexofenadine Hcl 180 Mg Tabs (Fexofenadine Hcl) .Marland Kitchen.. 1po Once Daily As Needed Allergies 4)  Daily Vitamins  Tabs (Multiple Vitamin) .Marland Kitchen.. 1 By Mouth Once Daily 5)  Hydrochlorothiazide 25 Mg Tabs (Hydrochlorothiazide) .Marland Kitchen.. 1 By Mouth Once Daily 6)  Doxycycline Hyclate 100 Mg Caps (Doxycycline  Hyclate) .Marland Kitchen.. 1po Two Times A Day 7)  Klor-Con 10 10 Meq Cr-Tabs (Potassium Chloride) .Marland Kitchen.. 1 By Mouth Once Daily 8)  Triamcinolone Acetonide 0.1 % Crea (Triamcinolone Acetonide) .... Use Asd Once Daily As Needed  Current Medications (verified): 1)  Labetalol Hcl 300 Mg Tabs (Labetalol Hcl) .Marland Kitchen.. 1 By Mouth Two Times A Day 2)  Errin 0.35 Mg Tabs (Norethindrone (Contraceptive)) .Marland Kitchen.. 1 By Mouth Once Daily 3)  Fexofenadine Hcl 180 Mg Tabs (Fexofenadine Hcl) .Marland Kitchen.. 1po Once Daily As Needed Allergies 4)  Daily Vitamins  Tabs (Multiple Vitamin) .Marland Kitchen.. 1 By Mouth Once Daily 5)  Hydrochlorothiazide 25 Mg Tabs (Hydrochlorothiazide) .Marland Kitchen.. 1 By Mouth Once Daily 6)  Klor-Con 10 10 Meq Cr-Tabs (Potassium Chloride) .Marland Kitchen.. 1 By Mouth  Once Daily 7)  Triamcinolone Acetonide 0.1 % Crea (Triamcinolone Acetonide) .... Use Asd Once Daily As Needed 8)  Sumatriptan Succinate 100 Mg Tabs (Sumatriptan Succinate) .Marland Kitchen.. 1po Every Other Day As Needed 9)  Alprazolam 0.25 Mg Tabs (Alprazolam) .Marland Kitchen.. 1po Once Daily As Needed  Allergies (verified): 1)  ! * Peanut Butter 2)  ! * Crab Legs 3)  ! * Blue Cheeze 4)  ! 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         all otherwise negative per pt -    Physical Exam  General:  alert and well-developed.   Head:  normocephalic and atraumatic.   Eyes:  vision grossly intact, pupils equal, and pupils round.   Ears:  R ear normal and L ear normal.   Nose:  no external deformity and no nasal discharge.   Mouth:  no gingival abnormalities and pharynx pink and moist.   Neck:  supple and no masses.   Lungs:  normal respiratory effort and normal breath sounds.   Heart:  normal rate and regular rhythm.     Abdomen:  soft, non-tender, and normal bowel sounds.   Msk:  no joint tenderness and no joint swelling.  , left wrist with small tender ganglion cyst Extremities:  no edema, no erythema  Neurologic:  cranial nerves II-XII intact and strength normal in all extremities.  gait normal.   Skin:  color normal and no rashes.   Psych:  not depressed appearing and moderately anxious.     Impression & Recommendations:  Problem # 1:  Preventive Health Care (ICD-V70.0) Overall doing well, age appropriate education and counseling updated, referral for preventive services and immunizations addressed, dietary counseling and smoking status adressed , most recent labs reviewed I have personally reviewed and have noted 1.The patient's medical and social history 2.Their use of alcohol, tobacco or illicit drugs 3.Their current medications and supplements 4. Functional ability including ADL's, fall risk, home safety risk, hearing & visual impairment  5.Diet and physical activities 6.Evidence for depression or mood disorders The patients weight, height, BMI  have been recorded in the chart I have made referrals, counseling and provided education to the patient based review of the above   Problem # 2:  HYPERTENSION (ICD-401.9)  Her updated medication list for this problem includes:    Labetalol Hcl 300 Mg Tabs (Labetalol hcl) .Marland Kitchen... 1 by mouth two times a day    Hydrochlorothiazide 25 Mg Tabs (Hydrochlorothiazide) .Marland Kitchen... 1 by mouth once daily  BP today: 118/80 Prior BP: 118/84 (09/03/2009)  Labs Reviewed: K+: 3.2 (12/11/2008) Creat: : 0.8 (12/11/2008)   Chol: 196 (12/11/2008)   HDL: 49.10 (12/11/2008)   LDL: 135 (12/11/2008)   TG: 59.0 (12/11/2008) stable overall by hx and exam, ok to continue meds/tx as is    Problem # 3:  MIGRAINE, COMMON (ICD-346.10)  Her updated medication list for this problem includes:    Labetalol Hcl 300 Mg Tabs (Labetalol hcl) .Marland Kitchen... 1 by mouth two times a day    Sumatriptan  Succinate 100 Mg Tabs (Sumatriptan succinate) .Marland Kitchen... 1po every other day as needed now daily for 10 days ;  treat as above, f/u any worsening signs or symptoms , also for HA wellness as needed   Orders: Headache Clinic Referral (Headache)  Problem # 4:  ANXIETY (ICD-300.00)  Her updated medication list for this problem includes:    Alprazolam 0.25 Mg Tabs (Alprazolam) .Marland Kitchen... 1po once daily as needed treat as above, f/u any worsening signs or symptoms , also refer counseling  Orders: Psychology Referral (Psychology)  Problem # 5:  GANGLION CYST, WRIST, LEFT (ICD-727.41) declines hand surgury at this time  Complete Medication List: 1)  Labetalol Hcl 300 Mg Tabs (Labetalol hcl) .Marland Kitchen.. 1 by mouth two times a day 2)  Errin 0.35 Mg Tabs (Norethindrone (contraceptive)) .Marland Kitchen.. 1 by mouth once daily 3)  Fexofenadine Hcl 180 Mg Tabs (Fexofenadine hcl) .Marland Kitchen.. 1po once daily as needed allergies 4)  Daily Vitamins Tabs (Multiple vitamin) .Marland Kitchen.. 1 by mouth once daily 5)  Hydrochlorothiazide 25 Mg Tabs (Hydrochlorothiazide) .Marland Kitchen.. 1 by mouth once daily 6)  Klor-con 10 10 Meq Cr-tabs (Potassium chloride) .Marland Kitchen.. 1 by mouth once daily 7)  Triamcinolone Acetonide 0.1 % Crea (Triamcinolone acetonide) .... Use asd once daily as needed 8)  Sumatriptan Succinate 100 Mg Tabs (Sumatriptan succinate) .Marland Kitchen.. 1po every other day as needed 9)  Alprazolam 0.25 Mg Tabs (Alprazolam) .Marland Kitchen.. 1po once daily as needed  Patient Instructions: 1)  For now it should be ok to use the Excedrin migraine as needed  2)  Please take all new medications as prescribed - the generic imitrex for more severe headaches 3)  You will be contacted about the referral(s) to: Headache wellness center 4)  Please take all new medications as prescribed - the generic xanax for only the panic attacks 5)  You will be contacted about the referral(s) to: Counseling 6)  please follow lower cholesterol diet 7)  Please call if you would like the referral to the hand  surgeon for the left ganglion cyst 8)  Please schedule a follow-up appointment in 1 year, or sooner if needed 9)  Please return at your convenience for CPX labs in the next 1-2 wks Prescriptions: KLOR-CON 10 10 MEQ CR-TABS (POTASSIUM CHLORIDE) 1 by mouth once daily  #90 x 3   Entered and Authorized by:   Corwin Levins MD   Signed by:   Corwin Levins MD on 12/09/2009  Method used:   Print then Give to Patient   RxID:   [id number] HYDROCHLOROTHIAZIDE 25 MG TABS (HYDROCHLOROTHIAZIDE) 1 by mouth once daily  #90 x 3   Entered and Authorized by:   Corwin Levins MD   Signed by:   Corwin Levins MD on 12/09/2009   Method used:   Print then Give to Patient   RxID:   [id number] FEXOFENADINE HCL 180 MG TABS (FEXOFENADINE HCL) 1po once daily as needed allergies  #90 x 3   Entered and Authorized by:   Corwin Levins MD   Signed by:   Corwin Levins MD on 12/09/2009   Method used:   Print then Give to Patient   RxID:   [id number] LABETALOL HCL 300 MG TABS (LABETALOL HCL) 1 by mouth two times a day  #180 x 3   Entered and Authorized by:   Corwin Levins MD   Signed by:   Corwin Levins MD on 12/09/2009   Method used:   Print then Give to Patient   RxID:   [id number] ALPRAZOLAM 0.25 MG TABS (ALPRAZOLAM) 1po once daily as needed  #30 x 1   Entered and Authorized by:   Corwin Levins MD   Signed by:   Corwin Levins MD on 12/09/2009   Method used:   Print then Give to Patient   RxID:   [id number] SUMATRIPTAN SUCCINATE 100 MG TABS (SUMATRIPTAN SUCCINATE) 1po every other day as needed  #9 x 5   Entered and Authorized by:   Corwin Levins MD   Signed by:   Corwin Levins MD on 12/09/2009   Method used:   Print then Give to Patient   RxID:   [id number]    Orders Added: 1)  Psychology Referral [Psychology] 2)  Headache Clinic Referral [Headache] 3)  Est. Patient 18-39 years [[id number]]

## 2010-02-02 NOTE — Progress Notes (Signed)
Summary: Rx req  Phone Note Call from Patient   Caller: Patient Summary of Call: pt called requesting replacement Rx for Anusol written 11/19/2008. pt says that she misplaced it. she would like Rx sent to [facility] on [address] [place] Knock to fill? Initial call taken by: Margaret Pyle, CMA,  January 06, 2009 9:01 AM  Follow-up for Phone Call        ok to fill - to robin Follow-up by: Corwin Levins MD,  January 06, 2009 11:41 AM    New/Updated Medications: ANUSOL-HC 25 MG SUPP (HYDROCORTISONE ACETATE) use as directed Prescriptions: ANUSOL-HC 25 MG SUPP (HYDROCORTISONE ACETATE) use as directed  #20 x 0   Entered by:   Scharlene Gloss   Authorized by:   Corwin Levins MD   Signed by:   Scharlene Gloss on 01/06/2009   Method used:   Faxed to ...       [facility] ([facility])       [address]       [place], [place]  [address]       Ph: [phone]       Fax: [phone]   RxID:   [id number]

## 2010-02-11 ENCOUNTER — Telehealth: Payer: Self-pay | Admitting: Internal Medicine

## 2010-02-18 NOTE — Progress Notes (Signed)
  Phone Note Refill Request Message from:  Fax from Pharmacy on February 11, 2010 3:36 PM  Refills Requested: Medication #1:  LABETALOL HCL 300 MG TABS 1 by mouth two times a day   Dosage confirmed as above?Dosage Confirmed   Notes: [facility] Initial call taken by: Zella Ball Ewing CMA (AAMA),  February 11, 2010 3:36 PM    Prescriptions: LABETALOL HCL 300 MG TABS (LABETALOL HCL) 1 by mouth two times a day  #60 x 0   Entered by:   Scharlene Gloss CMA (AAMA)   Authorized by:   Corwin Levins MD   Signed by:   Scharlene Gloss CMA (AAMA) on 02/11/2010   Method used:   Faxed to ...       [facility] [id number] (retail)       [address] [place], [place]  [address]       Ph: [phone]       Fax: [phone]   RxID:   [id number]

## 2010-03-09 ENCOUNTER — Telehealth: Payer: Self-pay | Admitting: Internal Medicine

## 2010-03-16 NOTE — Progress Notes (Signed)
Summary: Rx replacement  Phone Note Call from Patient   Caller: Patient [phone] Summary of Call: Pt called stating Rx for migraine medications was lost. Pt is having a migraine HA now and is requesting replacement Rx. Initial call taken by: Margaret Pyle, CMA,  March 09, 2010 10:20 AM  Follow-up for Phone Call        done per emr Follow-up by: Corwin Levins MD,  March 09, 2010 12:02 PM  Additional Follow-up for Phone Call Additional follow up Details #1::        Pt called back and was informed that Rx has been sent to [facility].Also inquired about referral to headache clinic. Informed Pt that she has an active referral order. Additional Follow-up by: Burnard Leigh [facility]),  March 09, 2010 12:08 PM    Prescriptions: SUMATRIPTAN SUCCINATE 100 MG TABS (SUMATRIPTAN SUCCINATE) 1po every other day as needed  #9 x 5   Entered and Authorized by:   Corwin Levins MD   Signed by:   Corwin Levins MD on 03/09/2010   Method used:   Electronically to        [facility] (retail)       [address]       [place], [place]  [address]       Ph: [phone]       Fax: [phone]   RxID:   [id number]

## 2010-04-11 LAB — CBC
HCT: 30.4 % — ABNORMAL LOW (ref 36.0–46.0)
HCT: 31.4 % — ABNORMAL LOW (ref 36.0–46.0)
Hemoglobin: 10.7 g/dL — ABNORMAL LOW (ref 12.0–15.0)
Hemoglobin: 10.9 g/dL — ABNORMAL LOW (ref 12.0–15.0)
Hemoglobin: 9.9 g/dL — ABNORMAL LOW (ref 12.0–15.0)
MCHC: 34.7 g/dL (ref 30.0–36.0)
MCHC: 34.8 g/dL (ref 30.0–36.0)
MCHC: 35.1 g/dL (ref 30.0–36.0)
MCV: 92.3 fL (ref 78.0–100.0)
MCV: 93.8 fL (ref 78.0–100.0)
Platelets: 207 K/uL (ref 150–400)
RBC: 3.31 MIL/uL — ABNORMAL LOW (ref 3.87–5.11)
RBC: 3.4 MIL/uL — ABNORMAL LOW (ref 3.87–5.11)
RDW: 14.3 % (ref 11.5–15.5)
RDW: 14.3 % (ref 11.5–15.5)
RDW: 14.4 % (ref 11.5–15.5)
WBC: 7.9 K/uL (ref 4.0–10.5)

## 2010-04-11 LAB — COMPREHENSIVE METABOLIC PANEL WITH GFR
ALT: 12 U/L (ref 0–35)
AST: 23 U/L (ref 0–37)
Albumin: 3.4 g/dL — ABNORMAL LOW (ref 3.5–5.2)
Alkaline Phosphatase: 113 U/L (ref 39–117)
BUN: 9 mg/dL (ref 6–23)
CO2: 21 meq/L (ref 19–32)
Calcium: 9.5 mg/dL (ref 8.4–10.5)
Chloride: 104 meq/L (ref 96–112)
Creatinine, Ser: 0.61 mg/dL (ref 0.4–1.2)
GFR calc non Af Amer: >60 mL/min
Glucose, Bld: 80 mg/dL (ref 70–99)
Potassium: 3.7 meq/L (ref 3.5–5.1)
Sodium: 134 meq/L — ABNORMAL LOW (ref 135–145)
Total Bilirubin: 0.7 mg/dL (ref 0.3–1.2)
Total Protein: 6.9 g/dL (ref 6.0–8.3)

## 2010-05-18 NOTE — H&P (Signed)
Kristie Nelson, Kristie Nelson                ACCOUNT NO.:  [id number]   MEDICAL RECORD NO.:  [id number]          PATIENT TYPE:  INP   LOCATION:  9107                          FACILITY:  [facility]   PHYSICIAN:  Lenoard Aden, M.D.DATE OF BIRTH:  1980-10-12   DATE OF ADMISSION:  07/04/2008  DATE OF DISCHARGE:                              HISTORY & PHYSICAL   CHIEF COMPLAINT:  Chronic hypertension and IUGR.   She is a 30 year old African American female G1, __P0_at [redacted] weeks  gestation who presents to the office this week with new onset AIUGR(5th  percentile with AC 0 percentile, normal amniotic fluid) and known  chronic hypertension, now for delivery at 38 weeks. Case discussed with  MFM Katrinka Blazing) who concurs with plan for delivery.   Prenatal course is complicated by IUGR and chronic hypertension.   FHx: Hypertension  SHx: Non contributory  Medications:  Labetalol  PNV  ROS: negative   PHYSICAL EXAMINATION:  GENERAL:  She is a well-developed [place]  American female, no apparent distress.  HEENT:  Normal.  LUNGS:  Clear.  HEART:  Regular rhythm.  ABDOMEN:  Soft, gravid, nontender.  Estimated fetal weight is 5-1/2  pounds.  Cervix is 2 to 3 cm, 100% vertex, -1.  EXTREMITIES:  No cords. Nl DTRs  NEUROLOGIC:  Intact. Nonfocal.   IMPRESSION:  1. A 38-week intrauterine pregnancy.  2. Chronic hypertension on Labetalol. No evidence of superimposed      preeclampsia.  3. New onset Asymmetric Intrauterine growth restriction.(EFW 5th      percentile with Lagging AC 0th percentile)   PLAN:  Proceed with Cervidil for cervical ripening followed by Pitocin  with cautious attempts to proceed with anticipated attempts at vaginal  delivery.  Check CBC, CMP and uric acid.  Continue Labetalol and watch BP closely.      Lenoard Aden, M.D.  Electronically Signed     RJT/MEDQ  D:  07/05/2008  T:  07/06/2008  Job:  [id number]

## 2010-05-18 NOTE — Discharge Summary (Signed)
Kristie Nelson, Kristie Nelson                ACCOUNT NO.:  [id number]   MEDICAL RECORD NO.:  [id number]          PATIENT TYPE:  INP   LOCATION:  9107                          [facility]ILITY[facility]   PHYSICIAN:  Lenoard Aden, M.D.DATE OF BIRTH:  September 07, 1980   DATE OF ADMISSION:  07/04/2008  DATE OF DISCHARGE:  07/08/2008                               DISCHARGE SUMMARY   PRIMARY CARE [facility]:  Lenoard Aden, MD   ADMITTING DIAGNOSES:  1. Gravida 2, para 0, at 38-1/7th weeks.  2. Term intrauterine pregnancy.  3. Induction of labor.  4. Chronic hypertension.  5. Asymmetric intrauterine growth retardation.   DISCHARGE DIAGNOSES:  1. Gravida 2, para 1.  2. Primary low transverse cesarean section postoperative day #3,      stable.  3. Chronic hypertension.   Prenatal care received at [facility] since 7 weeks'  gestation.   PRENATAL LABORATORY DATA:  A positive, antibody negative, rubella  immune, RPR nonreactive, HIV nonreactive, hepatitis B surface antigen  nonreactive, group B strep negative.   RISK FACTORS DURING PREGNANCY:  Chronic hypertension, asymmetric  intrauterine growth retardation.  The patient was on labetalol during  gestation for blood pressure which was well controlled.   ALLERGIES:  LATEX, PENICILLIN, SHELLFISH, PORK, PEANUT BUTTER, and BLUE  TEA.   LABOR SUMMARY:  The patient was admitted on July 04, 2008, at 2129 for  induction of labor due to chronic hypertension and intrauterine growth  retardation.  On admission, her vital signs were stable with a blood  pressure of 160/80, reactive fetal heart rate.  Cervidil was placed for  cervical ripening and induction of labor was planned for the a.m. of  July 05, 2008.  The patient was hospitalized overnight with cervical  ripening.  On July 05, 2008, the patient underwent artificial rupture of  membranes, intrauterine pressure catheter, and fetal scalp electrode  were placed.  Pitocin augmentation was  given.  Her blood pressure was  166/70, and at approximately 12:40 a.m. the fetal heart rate was noted  to have a baseline of 130-140 with occasional nonpersistent late fetal  heart rate decelerations noted.  Decision was made to watch the fetal  heart rate closely, and then decision was made with the patient to  proceed for primary low transverse cesarean section due to a  nonreassuring fetal heart rate.  Please see operative note, Dr. Olivia Mackie.   DELIVERY SUMMARY:  The patient underwent a primary low transverse  cesarean section on July 05, 2008, at 12:41 a.m. performed by Dr. Olivia Mackie under epidural anesthesia administered by Dr. Cherre Blanc.  She  delivered a viable full-term female infant at 12:41 with spontaneous  respirations, Apgars of 8 and 9, weighing 5 pounds and 4 ounces, 19  inches in length.  There was a loose nuchal and loose body three-vessel  cord noted.  Placenta was removed manually at 12:44.  Membranes were  ruptured artificially on July 05, 2008, at 9:22 a.m. and the fluid was  noted to be clear throughout labor and delivery.  The patient underwent  an uncomplicated uneventful postoperative stay on the postpartum unit.  On postoperative day #2, her labetalol was increased from 300 mg p.o.  b.i.d. to 300 mg p.o. t.i.d. for blood pressure control.  On  postoperative day #3, the blood pressures were ranging 148-169 systolic  and 76-90 diastolic.  Labs on postoperative day #1 indicated a  hemoglobin of 9.9, hematocrit 28.4, white blood cell count of 12.3, and  a platelet count of 194,000.  The patient was discharged home stable on  postoperative day #3 with instructions to continue labetalol 300 mg p.o.  t.i.d. and to schedule an appointment at [facility] in 1 week for a  blood pressure check.  She will monitor her blood pressure at home and  call the office with any elevation.  She was given prescription for  Percocet 5/325 mg 1-2 tablets p.o. q.4-6 h.  p.r.n. for pain, ibuprofen  600 mg p.o. q.6 h. p.r.n. for cramping.  She was also instructed to take  docusate sodium 100 mg p.o. over the counter once to twice daily if  needed for prevention of constipation and simethicone 80 mg p.o.  chewable with meals and at bedtime for gas prevention.  She will  continue her prenatal vitamins daily.  She will follow up in the office  at 1 week for an RN visit for blood pressure check and at 6 weeks for a  postoperative visit with Dr. Olivia Mackie.  She was given the [facility] postpartum instruction booklet and encouraged to  call the office at any time with any questions or concerns.  The patient  and her spouse verbalized their understanding of the discharge  instruction.  The patient was discharged home on July 08, 2008.  Discharging physician Dr. Olivia Mackie.   DISCHARGE DIAGNOSES:  1. Gravida 2, para 1.  2. Failed induction with Non reassuring Fetal Status  3. Primary low transverse cesarean section on postoperative day #3,      female, bottle feeding, stable.  4. Chronic hypertension, well controlled on labetalol 300 mg p.o.      t.i.d.   Please call me with any questions at [phone].   VITAL SIGNS UPON DISCHARGE:  Temp is 98.3, blood pressure 159/89, pulse  93, and respirations 18.      Merrilee Jansky, CNM      Lenoard Aden, M.D.  Electronically Signed    DL/MEDQ  D:  45/40/9811  T:  07/08/2008  Job:  [id number]

## 2010-05-18 NOTE — Op Note (Signed)
Kristie Nelson, Kristie Nelson                ACCOUNT NO.:  [id number]   MEDICAL RECORD NO.:  [id number]          PATIENT TYPE:  INP   LOCATION:  9107                          FACILITY:  [facility]   PHYSICIAN:  Lenoard Aden, M.D.DATE OF BIRTH:  Jun 18, 1980   DATE OF PROCEDURE:  07/05/2008  DATE OF DISCHARGE:                               OPERATIVE REPORT   PREOPERATIVE DIAGNOSES:  A 38-week intrauterine pregnancy, intrauterine  growth restriction, chronic hypertension, nonreassuring heart rate  tracing __________, nuchal cord x1.   PROCEDURE:  Primary low segment transverse cesarean section.   SURGEON:  Lenoard Aden, MD   ASSISTANT:  Dineen Kid. Rana Snare, MD   ANESTHESIA:  Epidural.   ESTIMATED BLOOD LOSS:  500 mL.   COMPLICATIONS:  None.   DRAINS:  Foley counts correct.  The patient recovery in good condition.   BRIEF OPERATIVE NOTE:  After being apprised the risks of anesthesia,  infection, bleeding, injury to abdominal organs, need for repair,  delayed versus immediate complications to include bowel and bladder  injury.  The patient brought to the operating room, where she was  administered a epidural anesthesia without complications, prepped, and  draped in usual sterile fashion.  Foley catheter was previously placed.  After achieving adequate anesthesia, dilute Marcaine solution placed.  A  Pfannenstiel skin incision was made with scalpel and carried down to  fascia, which was nicked in the midline and opened transversely using  Mayo scissors.  Rectus muscles were dissected sharply in the midline.  Peritoneum was entered sharply.  Bladder blade was placed.  Visceral  peritoneum scored sharply off the lower uterine segment.  Kerr  hysterotomy incision made.  Atraumatic delivery of full-term living female  from an occiput posterior position,  handed occiput transverse position,  handed to the pediatricians, and septal reduction, nuchal cord x1.  Apgars 8 and 9.  Cord pH 7.31.  Uterus is  curetted using a dry lap pack  and placenta delivered manually intact.  Uterus was found to be empty of  any placental fragments.  Normal tubes, normal ovaries noted.  No  extension of the lower uterine incision.  Bladder flap inspected and  found to be hemostatic.  Uterine incision closed in 2 running  imbricating layers of 0 Monocryl suture in subcutaneous.  Irrigation  accomplished.  Parietal peritoneum closed using 2-0 chromic in a running fashion.  Fascia closed using 0 Monocryl suture in a running fashion.  Skin closed  using primary skin suture, subcuticular suture of a 4-0 Monocryl.  Good  hemostasis achieved.  The patient tolerates procedure well, transferred  to recovery in good condition.      Lenoard Aden, M.D.  Electronically Signed     RJT/MEDQ  D:  07/05/2008  T:  07/06/2008  Job:  [id number]

## 2010-08-19 ENCOUNTER — Other Ambulatory Visit: Payer: Self-pay | Admitting: Internal Medicine

## 2010-09-27 ENCOUNTER — Ambulatory Visit (INDEPENDENT_AMBULATORY_CARE_PROVIDER_SITE_OTHER): Payer: BC Managed Care – PPO | Admitting: Internal Medicine

## 2010-09-27 ENCOUNTER — Encounter: Payer: Self-pay | Admitting: Internal Medicine

## 2010-09-27 VITALS — BP 152/100 | HR 104 | Temp 101.8°F | Ht 63.0 in | Wt 129.0 lb

## 2010-09-27 DIAGNOSIS — R030 Elevated blood-pressure reading, without diagnosis of hypertension: Secondary | ICD-10-CM | POA: Insufficient documentation

## 2010-09-27 DIAGNOSIS — J029 Acute pharyngitis, unspecified: Secondary | ICD-10-CM | POA: Insufficient documentation

## 2010-09-27 DIAGNOSIS — L259 Unspecified contact dermatitis, unspecified cause: Secondary | ICD-10-CM

## 2010-09-27 DIAGNOSIS — Z0000 Encounter for general adult medical examination without abnormal findings: Secondary | ICD-10-CM

## 2010-09-27 DIAGNOSIS — Z0001 Encounter for general adult medical examination with abnormal findings: Secondary | ICD-10-CM | POA: Insufficient documentation

## 2010-09-27 MED ORDER — LEVOFLOXACIN 500 MG PO TABS: 500.0000 mg | ORAL_TABLET | Freq: Every day | ORAL | Status: AC

## 2010-09-27 MED ORDER — TRIAMCINOLONE ACETONIDE 0.1 % EX CREA: TOPICAL_CREAM | Freq: Two times a day (BID) | CUTANEOUS | Status: DC

## 2010-09-27 NOTE — Patient Instructions (Addendum)
Take all new medications as prescribed Continue all other medications as before Please  Check your Blood Pressure on a regular basis; your goal is to be less then 140/90 Please return in 3 mo with Lab testing done 3-5 days before

## 2010-10-03 ENCOUNTER — Encounter: Payer: Self-pay | Admitting: Internal Medicine

## 2010-10-03 NOTE — Progress Notes (Signed)
  Subjective:    Patient ID: Kristie Nelson, female    DOB: [birth year], 30 y.o.   MRN: [id number]  HPI   Here with 3 days acute onset fever, severe ST, general weakness and malaise, but little to no cough and Pt denies chest pain, increased sob or doe, wheezing, orthopnea, PND, increased LE swelling, palpitations, dizziness or syncope or HA.  Pt denies new neurological symptoms such as new headache, or facial or extremity weakness or numbness   Pt denies polydipsia, polyuria.  Not pregnant, on BCP.  Overall good compliance with treatment, and good medicine tolerability. Also with 2 wks worsening rash to right base of neck, hard to stop scratching, worse at night. Past Medical History  Diagnosis Date  . ECZEMA 03/19/2009  . HYPERLIPIDEMIA 12/09/2009  . ANXIETY 04/03/2007  . DEPRESSION 04/03/2007  . MIGRAINE, COMMON 12/09/2009  . HYPERTENSION 04/03/2007  . ALLERGIC RHINITIS 04/03/2007   No past surgical history on file.  reports that she has never smoked. She does not have any smokeless tobacco history on file. Her alcohol and drug histories not on file. family history is not on file. Allergies  Allergen Reactions  . Peanut-Containing Drug Products     REACTION: throat swelling  . Penicillins     REACTION: itching   No current outpatient prescriptions on file prior to visit.   Review of Systems Review of Systems  Constitutional: Negative for diaphoresis and unexpected weight change.  HENT: Negative for drooling and tinnitus.   Eyes: Negative for photophobia and visual disturbance.  Respiratory: Negative for choking and stridor.   Gastrointestinal: Negative for vomiting and blood in stool.  Genitourinary: Negative for hematuria and decreased urine volume.    Objective:   Physical Exam BP 152/100  Pulse 104  Temp(Src) 101.8 F (38.8 C) (Oral)  Ht 5\' 3"  (1.6 m)  Wt 129 lb (58.514 kg)  BMI 22.85 kg/m2  SpO2 99% Physical Exam  VS noted, mild ill Constitutional: Pt appears well-developed  and well-nourished.  HENT: Head: Normocephalic.  Right Ear: External ear normal.  Left Ear: External ear normal.  Bilat tm's mild erythema.  Sinus nontender.  Pharynx severe erythema with exudate Eyes: Conjunctivae and EOM are normal. Pupils are equal, round, and reactive to light.  Neck: Normal range of motion. Neck supple.  Cardiovascular: Normal rate and regular rhythm.   Pulmonary/Chest: Effort normal and breath sounds normal.  Neurological: Pt is alert. No cranial nerve deficit.  Skin: Skin is warm. No erythema. except for silvery scaly area with itch to right base of neck Psychiatric: Pt behavior is normal. Thought content normal.     Assessment & Plan:

## 2010-10-03 NOTE — Assessment & Plan Note (Signed)
Mild to mod, for topical steroid course,  to f/u any worsening symptoms or concerns 

## 2010-10-03 NOTE — Assessment & Plan Note (Signed)
Mild, likely situational, Continue all other medications as before,  to f/u any worsening symptoms or concerns

## 2010-10-03 NOTE — Assessment & Plan Note (Signed)
Mild to mod, for antibx course,  to f/u any worsening symptoms or concerns 

## 2010-10-12 ENCOUNTER — Other Ambulatory Visit: Payer: Self-pay

## 2010-10-12 MED ORDER — HYDROCHLOROTHIAZIDE 25 MG PO TABS: 25.0000 mg | ORAL_TABLET | Freq: Every day | ORAL | Status: DC

## 2010-11-02 ENCOUNTER — Ambulatory Visit: Payer: BC Managed Care – PPO | Admitting: Internal Medicine

## 2010-12-14 ENCOUNTER — Ambulatory Visit (INDEPENDENT_AMBULATORY_CARE_PROVIDER_SITE_OTHER): Payer: BC Managed Care – PPO | Admitting: *Deleted

## 2010-12-14 VITALS — BP 132/88

## 2010-12-14 DIAGNOSIS — I10 Essential (primary) hypertension: Secondary | ICD-10-CM

## 2010-12-14 NOTE — Progress Notes (Signed)
Pt schedule for f/u with labs prior

## 2011-01-05 ENCOUNTER — Ambulatory Visit (INDEPENDENT_AMBULATORY_CARE_PROVIDER_SITE_OTHER): Payer: BC Managed Care – PPO | Admitting: Internal Medicine

## 2011-01-05 ENCOUNTER — Encounter: Payer: Self-pay | Admitting: *Deleted

## 2011-01-05 VITALS — BP 140/92 | HR 86 | Temp 98.4°F | Ht 62.0 in | Wt 129.2 lb

## 2011-01-05 DIAGNOSIS — F411 Generalized anxiety disorder: Secondary | ICD-10-CM

## 2011-01-05 DIAGNOSIS — H6500 Acute serous otitis media, unspecified ear: Secondary | ICD-10-CM

## 2011-01-05 DIAGNOSIS — I10 Essential (primary) hypertension: Secondary | ICD-10-CM

## 2011-01-05 DIAGNOSIS — H6503 Acute serous otitis media, bilateral: Secondary | ICD-10-CM

## 2011-01-05 MED ORDER — LABETALOL HCL 200 MG PO TABS: ORAL_TABLET | ORAL | Status: DC

## 2011-01-05 MED ORDER — ESCITALOPRAM OXALATE 10 MG PO TABS: 10.0000 mg | ORAL_TABLET | Freq: Every day | ORAL | Status: DC

## 2011-01-05 MED ORDER — CLONAZEPAM 0.5 MG PO TABS: 0.5000 mg | ORAL_TABLET | Freq: Two times a day (BID) | ORAL | Status: DC | PRN

## 2011-01-05 MED ORDER — HYDROCORTISONE ACETATE 25 MG RE SUPP: 25.0000 mg | Freq: Two times a day (BID) | RECTAL | Status: AC

## 2011-01-05 NOTE — Patient Instructions (Addendum)
Ok to use mucinex as needed for the ears and congestion Take all new medications as prescribed - the clonazepam as needed, and lexapro 10 mg daily Ok to increase the labetolol to 400 mg twice per day Continue all other medications as before - the anusol [facility]

## 2011-01-09 ENCOUNTER — Encounter: Payer: Self-pay | Admitting: Internal Medicine

## 2011-01-09 DIAGNOSIS — H6503 Acute serous otitis media, bilateral: Secondary | ICD-10-CM | POA: Insufficient documentation

## 2011-01-09 NOTE — Assessment & Plan Note (Signed)
Ok for clonazepam and lexapro asd,  to f/u any worsening symptoms or concerns . Declines counseling, will need to hold meds for planned pregancy if decides later this yr

## 2011-01-09 NOTE — Progress Notes (Signed)
  Subjective:    Patient ID: Kristie Nelson, female    DOB: 03-Jan-1981, 31 y.o.   MRN: [id number]  HPI  Here with bilat ear fullness and popping, without hearing loss, pain, dizziness, vertigo, fever, HA, ST or other sinus congestion.  Denies worsening depressive symptoms, suicidal ideation, or panic, though has ongoing anxiety, mild increased recently and states mult stressors.  BP at home several occasions mild elevated.  Considering pregnancy later this yr.  Pt denies chest pain, increased sob or doe, wheezing, orthopnea, PND, increased LE swelling, palpitations, dizziness or syncope.  Pt denies new neurological symptoms such as new headache, or facial or extremity weakness or numbness   Pt denies polydipsia, polyuria.   Pt denies fever, wt loss, night sweats, loss of appetite, or other constitutional symptoms Requests refills anusol for sympt hemoorhoid again, no bleeding but mild discomfort Past Medical History  Diagnosis Date  . ECZEMA 03/19/2009  . HYPERLIPIDEMIA 12/09/2009  . ANXIETY 04/03/2007  . DEPRESSION 04/03/2007  . MIGRAINE, COMMON 12/09/2009  . HYPERTENSION 04/03/2007  . ALLERGIC RHINITIS 04/03/2007   No past surgical history on file.  reports that she has never smoked. She does not have any smokeless tobacco history on file. She reports that she does not drink alcohol or use illicit drugs. family history includes Diabetes in her mother; Hypertension in her other; and Thyroid disease in her other. Allergies  Allergen Reactions  . Peanut-Containing Drug Products     REACTION: throat swelling  . Penicillins     REACTION: itching   Current Outpatient Prescriptions on File Prior to Visit  Medication Sig Dispense Refill  . hydrochlorothiazide (HYDRODIURIL) 25 MG tablet Take 1 tablet (25 mg total) by mouth daily.  30 tablet  5  . Multiple Vitamin (MULTIVITAMIN) capsule Take 1 capsule by mouth daily.        Marland Kitchen triamcinolone (KENALOG) 0.1 % cream Apply topically 2 (two) times daily. Use as  directed  30 g  1   Review of Systems Review of Systems  Constitutional: Negative for diaphoresis and unexpected weight change.  HENT: Negative for drooling and tinnitus.   Eyes: Negative for photophobia and visual disturbance.  Respiratory: Negative for choking and stridor.   Gastrointestinal: Negative for vomiting and blood in stool.  Genitourinary: Negative for hematuria and decreased urine volume.    Objective:   Physical Exam BP 140/92  Pulse 86  Temp(Src) 98.4 F (36.9 C) (Oral)  Ht 5\' 2"  (1.575 m)  Wt 129 lb 4 oz (58.627 kg)  BMI 23.64 kg/m2  SpO2 98% Physical Exam  VS noted, mild ill Constitutional: Pt appears well-developed and well-nourished.  HENT: Head: Normocephalic.  Right Ear: External ear normal.  Left Ear: External ear normal.  Bilat tm's mild erythema.  Sinus nontender.  Pharynx mild erythema Eyes: Conjunctivae and EOM are normal. Pupils are equal, round, and reactive to light.  Neck: Normal range of motion. Neck supple.  Cardiovascular: Normal rate and regular rhythm.   Pulmonary/Chest: Effort normal and breath sounds normal.  Neurological: Pt is alert. No cranial nerve deficit. motor/dtr/gait intact Skin: Skin is warm. No erythema.  Psychiatric: Pt behavior is normal. Thought content normal. mild dysphoric today, 2+ nervous    Assessment & Plan:

## 2011-01-09 NOTE — Assessment & Plan Note (Signed)
Mild, for mucinex otc prn, for ENT if persists or worsens

## 2011-01-09 NOTE — Assessment & Plan Note (Addendum)
Uncontrolled, for increased labetolol to 400 bid,  to f/u any worsening symptoms or concerns,   BP Readings from Last 3 Encounters:  01/05/11 140/92  12/14/10 132/88  09/27/10 152/100

## 2011-01-12 ENCOUNTER — Telehealth: Payer: Self-pay

## 2011-01-12 MED ORDER — FEXOFENADINE HCL 180 MG PO TABS: 180.0000 mg | ORAL_TABLET | Freq: Every day | ORAL | Status: DC

## 2011-01-12 NOTE — Telephone Encounter (Signed)
Ok for [employer] prn, also can use mucinex otc prn

## 2011-01-12 NOTE — Telephone Encounter (Signed)
Pt called requesting Rx to treat nasal sinus symptoms, please advise.

## 2011-01-13 NOTE — Telephone Encounter (Signed)
Patient informed. 

## 2011-02-18 ENCOUNTER — Other Ambulatory Visit (INDEPENDENT_AMBULATORY_CARE_PROVIDER_SITE_OTHER): Payer: BC Managed Care – PPO

## 2011-02-18 ENCOUNTER — Ambulatory Visit (INDEPENDENT_AMBULATORY_CARE_PROVIDER_SITE_OTHER): Payer: BC Managed Care – PPO | Admitting: Internal Medicine

## 2011-02-18 ENCOUNTER — Encounter: Payer: Self-pay | Admitting: *Deleted

## 2011-02-18 ENCOUNTER — Encounter: Payer: Self-pay | Admitting: Internal Medicine

## 2011-02-18 VITALS — BP 114/84 | HR 92 | Temp 98.5°F | Resp 14 | Wt 129.8 lb

## 2011-02-18 DIAGNOSIS — N912 Amenorrhea, unspecified: Secondary | ICD-10-CM

## 2011-02-18 DIAGNOSIS — M25579 Pain in unspecified ankle and joints of unspecified foot: Secondary | ICD-10-CM

## 2011-02-18 DIAGNOSIS — J019 Acute sinusitis, unspecified: Secondary | ICD-10-CM

## 2011-02-18 DIAGNOSIS — I10 Essential (primary) hypertension: Secondary | ICD-10-CM

## 2011-02-18 DIAGNOSIS — M25571 Pain in right ankle and joints of right foot: Secondary | ICD-10-CM

## 2011-02-18 MED ORDER — AZITHROMYCIN 250 MG PO TABS: ORAL_TABLET | ORAL | Status: DC

## 2011-02-18 MED ORDER — NAPROXEN 500 MG PO TABS: 500.0000 mg | ORAL_TABLET | Freq: Two times a day (BID) | ORAL | Status: AC

## 2011-02-18 NOTE — Patient Instructions (Addendum)
Take all new medications as prescribed - the antibiotic and pain medication You will be contacted regarding the referral for: orthopedic Please go to LAB in the Basement for the blood and/or urine tests to be done today Please call the phone number [phone] (the PhoneTree System) for results of testing in 2-3 days;  When calling, simply dial the number, and when prompted enter the MRN number above (the Medical Record Number) and the # key, then the message should start.  Since you are here today, I think OK to cancel the Mar 16 2011 appt  Please return in Dec 2013 with Lab testing done 3-5 days before

## 2011-02-18 NOTE — Assessment & Plan Note (Signed)
?   Tendonitis vs arthritis - for orhto referral, nsaid prn, gave note to excuse from training class related to work next wk

## 2011-02-19 ENCOUNTER — Encounter: Payer: Self-pay | Admitting: Internal Medicine

## 2011-02-19 NOTE — Progress Notes (Signed)
Subjective:    Patient ID: Kristie Nelson, female    DOB: [birth year], 31 y.o.   MRN: [id number]  HPI   Here with 2 wks acute onset fever, facial pain, pressure, general weakness and malaise, and greenish d/c, with slight ST, but little to no cough and Pt denies chest pain, increased sob or doe, wheezing, orthopnea, PND, increased LE swelling, palpitations, dizziness or syncope.  Pt denies new neurological symptoms such as new headache, or facial or extremity weakness or numbness   Pt denies polydipsia, polyuria, Pt states overall good compliance with meds.  Also mentions last menses Dec 25, none since, has always been regular, home preg test neg but asks for lab evaluation.  Also mentions signficiant right ankle pain and instability with several falls in the past few months, no recent trauma, no swelling or fever, no prior hx of pain or falls related;  Has been using brace but does not always help, worse to walk and stand for extended time, better to sit , moderated, sharp pain intermittent, none today.   Past Medical History  Diagnosis Date  . ECZEMA 03/19/2009  . HYPERLIPIDEMIA 12/09/2009  . ANXIETY 04/03/2007  . DEPRESSION 04/03/2007  . MIGRAINE, COMMON 12/09/2009  . HYPERTENSION 04/03/2007  . ALLERGIC RHINITIS 04/03/2007   No past surgical history on file.  reports that she has never smoked. She does not have any smokeless tobacco history on file. She reports that she does not drink alcohol or use illicit drugs. family history includes Diabetes in her mother; Hypertension in her other; and Thyroid disease in her other. Allergies  Allergen Reactions  . Peanut-Containing Drug Products     REACTION: throat swelling  . Penicillins     REACTION: itching   Current Outpatient Prescriptions on File Prior to Visit  Medication Sig Dispense Refill  . escitalopram (LEXAPRO) 10 MG tablet Take 1 tablet (10 mg total) by mouth daily.  30 tablet  11  . fexofenadine (ALLEGRA) 180 MG tablet Take 1 tablet (180  mg total) by mouth daily.  30 tablet  11  . hydrochlorothiazide (HYDRODIURIL) 25 MG tablet Take 1 tablet (25 mg total) by mouth daily.  30 tablet  5  . imipramine (TOFRANIL) 25 MG tablet Take 25 mg by mouth 2 (two) times daily.        Marland Kitchen labetalol (NORMODYNE) 200 MG tablet 2 tabs by mouth twice per day  120 tablet  11  . Multiple Vitamin (MULTIVITAMIN) capsule Take 1 capsule by mouth daily.        Marland Kitchen triamcinolone (KENALOG) 0.1 % cream Apply topically 2 (two) times daily. Use as directed  30 g  1   Review of Systems Review of Systems  Constitutional: Negative for diaphoresis and unexpected weight change.  HENT: Negative for drooling and tinnitus.   Eyes: Negative for photophobia and visual disturbance.  Respiratory: Negative for choking and stridor.   Gastrointestinal: Negative for vomiting and blood in stool.  Genitourinary: Negative for hematuria and decreased urine volume.     Objective:   Physical Exam BP 114/84  Pulse 92  Temp(Src) 98.5 F (36.9 C) (Oral)  Resp 14  Wt 129 lb 12 oz (58.854 kg)  SpO2 99%  LMP 12/28/2010 Physical Exam  VS noted Constitutional: Pt appears well-developed and well-nourished.  HENT: Head: Normocephalic.  Right Ear: External ear normal.  Left Ear: External ear normal.  Bilat tm's mild erythema.  Sinus tender bilat .  Pharynx mild erythema Eyes: Conjunctivae and EOM are  normal. Pupils are equal, round, and reactive to light.  Neck: Normal range of motion. Neck supple.  Cardiovascular: Normal rate and regular rhythm.   Pulmonary/Chest: Effort normal and breath sounds normal.  Neurological: Pt is alert. No cranial nerve deficit.  Skin: Skin is warm. No erythema.  Psychiatric: Pt behavior is normal. Thought content normal.  Right foot and ankle NT, no swelling, and FROM o/w neurovasc intact    Assessment & Plan:

## 2011-02-19 NOTE — Assessment & Plan Note (Signed)
stable overall by hx and exam, most recent data reviewed with pt, and pt to continue medical treatment as before  BP Readings from Last 3 Encounters:  02/18/11 114/84  01/05/11 140/92  12/14/10 132/88

## 2011-02-19 NOTE — Assessment & Plan Note (Signed)
Mild to mod, for antibx course,  to f/u any worsening symptoms or concerns 

## 2011-02-19 NOTE — Assessment & Plan Note (Signed)
Unclear etiology, for bhcg today, but should also see GYN if neg

## 2011-03-16 ENCOUNTER — Ambulatory Visit: Payer: BC Managed Care – PPO | Admitting: Internal Medicine

## 2011-04-12 ENCOUNTER — Other Ambulatory Visit: Payer: Self-pay | Admitting: Internal Medicine

## 2011-04-18 ENCOUNTER — Ambulatory Visit (INDEPENDENT_AMBULATORY_CARE_PROVIDER_SITE_OTHER): Payer: BC Managed Care – PPO | Admitting: Internal Medicine

## 2011-04-18 ENCOUNTER — Encounter: Payer: Self-pay | Admitting: Internal Medicine

## 2011-04-18 VITALS — BP 138/88 | HR 86 | Temp 97.9°F | Resp 14 | Wt 130.5 lb

## 2011-04-18 DIAGNOSIS — J019 Acute sinusitis, unspecified: Secondary | ICD-10-CM

## 2011-04-18 DIAGNOSIS — J309 Allergic rhinitis, unspecified: Secondary | ICD-10-CM

## 2011-04-18 DIAGNOSIS — I10 Essential (primary) hypertension: Secondary | ICD-10-CM

## 2011-04-18 MED ORDER — AZITHROMYCIN 500 MG PO TABS: 500.0000 mg | ORAL_TABLET | Freq: Every day | ORAL | Status: AC

## 2011-04-18 MED ORDER — MOMETASONE FUROATE 50 MCG/ACT NA SUSP: 2.0000 | Freq: Every day | NASAL | Status: DC

## 2011-04-18 NOTE — Patient Instructions (Signed)

## 2011-04-18 NOTE — Assessment & Plan Note (Signed)
Start nasonex in addition to the antihistamines

## 2011-04-18 NOTE — Progress Notes (Signed)
Subjective:    Patient ID: Kristie Nelson, female    DOB: 1980-09-30, 31 y.o.   MRN: [id number]  Sinusitis This is a recurrent problem. The current episode started 1 to 4 weeks ago. The problem has been gradually worsening since onset. There has been no fever. The fever has been present for less than 1 day. Her pain is at a severity of 0/10. She is experiencing no pain. Associated symptoms include congestion, sinus pressure, sneezing and a sore throat. Pertinent negatives include no chills, coughing, diaphoresis, ear pain, headaches, hoarse voice, neck pain, shortness of breath or swollen glands.      Review of Systems  Constitutional: Negative for fever, chills, diaphoresis, activity change, appetite change, fatigue and unexpected weight change.  HENT: Positive for congestion, sore throat, rhinorrhea, sneezing, postnasal drip and sinus pressure. Negative for hearing loss, ear pain, nosebleeds, hoarse voice, facial swelling, drooling, mouth sores, trouble swallowing, neck pain, neck stiffness, dental problem, voice change, tinnitus and ear discharge.   Eyes: Negative.   Respiratory: Negative for cough, chest tightness, shortness of breath, wheezing and stridor.   Cardiovascular: Negative for chest pain, palpitations and leg swelling.  Gastrointestinal: Negative for nausea, vomiting, abdominal pain, diarrhea, constipation, blood in stool and abdominal distention.  Genitourinary: Negative.   Musculoskeletal: Negative for myalgias, back pain, joint swelling, arthralgias and gait problem.  Skin: Negative for color change, pallor, rash and wound.  Neurological: Negative for dizziness, tremors, seizures, syncope, facial asymmetry, speech difficulty, weakness, light-headedness, numbness and headaches.  Hematological: Negative for adenopathy. Does not bruise/bleed easily.  Psychiatric/Behavioral: Negative.        Objective:   Physical Exam  Vitals reviewed. Constitutional: She is oriented to  person, place, and time. She appears well-developed and well-nourished. No distress.  HENT:  Head: Normocephalic and atraumatic. No trismus in the jaw.  Right Ear: Hearing, tympanic membrane, external ear and ear canal normal.  Left Ear: Hearing, tympanic membrane, external ear and ear canal normal.  Nose: Mucosal edema and rhinorrhea present. No nose lacerations, sinus tenderness, nasal deformity, septal deviation or nasal septal hematoma. No epistaxis.  No foreign bodies. Right sinus exhibits maxillary sinus tenderness. Right sinus exhibits no frontal sinus tenderness. Left sinus exhibits maxillary sinus tenderness. Left sinus exhibits no frontal sinus tenderness.  Mouth/Throat: Oropharynx is clear and moist and mucous membranes are normal. Mucous membranes are not pale, not dry and not cyanotic. No uvula swelling. No oropharyngeal exudate, posterior oropharyngeal edema, posterior oropharyngeal erythema or tonsillar abscesses.  Eyes: Conjunctivae are normal. Right eye exhibits no discharge. Left eye exhibits no discharge. No scleral icterus.  Neck: Normal range of motion. Neck supple. No JVD present. No tracheal deviation present. No thyromegaly present.  Cardiovascular: Normal rate, regular rhythm, normal heart sounds and intact distal pulses.  Exam reveals no gallop and no friction rub.   No murmur heard. Pulmonary/Chest: Effort normal and breath sounds normal. No stridor. No respiratory distress. She has no wheezes. She has no rales.  Abdominal: Soft. Bowel sounds are normal. She exhibits no distension and no mass. There is no tenderness. There is no rebound and no guarding.  Musculoskeletal: Normal range of motion. She exhibits no edema and no tenderness.  Lymphadenopathy:    She has no cervical adenopathy.  Neurological: She is oriented to person, place, and time.  Skin: Skin is warm and dry. No rash noted. She is not diaphoretic. No erythema. No pallor.  Psychiatric: She has a normal mood  and affect. Her behavior is  normal. Judgment and thought content normal.          Assessment & Plan:

## 2011-04-18 NOTE — Assessment & Plan Note (Signed)
Start Zpak for the infection 

## 2011-04-18 NOTE — Assessment & Plan Note (Signed)
She has adequate control of her BP 

## 2011-04-19 ENCOUNTER — Telehealth: Payer: Self-pay

## 2011-04-19 ENCOUNTER — Ambulatory Visit: Payer: BC Managed Care – PPO | Admitting: Internal Medicine

## 2011-04-19 MED ORDER — DOXYCYCLINE HYCLATE 100 MG PO TABS: 100.0000 mg | ORAL_TABLET | Freq: Two times a day (BID) | ORAL | Status: AC

## 2011-04-19 NOTE — Telephone Encounter (Signed)
Pt is referring to recent FDA warning of signficant arrythmias that can occur with zpack  Though she has not had this in the past, ok to change med due to pt concern which I think should be ok with Dr Yetta Barre  Pt also with hx of allergy to PCN  [facility] for doxy course - done per emr

## 2011-04-19 NOTE — Telephone Encounter (Signed)
Why?  

## 2011-04-19 NOTE — Telephone Encounter (Signed)
Pt called stating she was Rxd Zpak for sinus/tonsil infection. Pt says that she prefers not to take this particular ABX and is requesting an alternative, please advise.

## 2011-04-19 NOTE — Telephone Encounter (Signed)
Called the patient left message to call back 

## 2011-04-19 NOTE — Telephone Encounter (Signed)
Pt says that the FDA warns against it use in individuals with HTN because of the increased risk of heart attack.

## 2011-04-20 NOTE — Telephone Encounter (Signed)
Patient informed. 

## 2011-04-22 ENCOUNTER — Telehealth (INDEPENDENT_AMBULATORY_CARE_PROVIDER_SITE_OTHER): Payer: Self-pay | Admitting: General Surgery

## 2011-04-25 NOTE — Telephone Encounter (Signed)
Error, wrong patient

## 2011-05-12 ENCOUNTER — Encounter: Payer: Self-pay | Admitting: Internal Medicine

## 2011-05-12 ENCOUNTER — Ambulatory Visit (INDEPENDENT_AMBULATORY_CARE_PROVIDER_SITE_OTHER): Payer: BC Managed Care – PPO | Admitting: Internal Medicine

## 2011-05-12 VITALS — BP 146/92 | HR 91 | Temp 98.1°F | Ht 62.0 in | Wt 130.0 lb

## 2011-05-12 DIAGNOSIS — F411 Generalized anxiety disorder: Secondary | ICD-10-CM

## 2011-05-12 DIAGNOSIS — J309 Allergic rhinitis, unspecified: Secondary | ICD-10-CM

## 2011-05-12 DIAGNOSIS — I10 Essential (primary) hypertension: Secondary | ICD-10-CM

## 2011-05-12 MED ORDER — AMLODIPINE BESYLATE 5 MG PO TABS: 5.0000 mg | ORAL_TABLET | Freq: Every day | ORAL | Status: DC

## 2011-05-12 MED ORDER — ESCITALOPRAM OXALATE 20 MG PO TABS: 20.0000 mg | ORAL_TABLET | Freq: Every day | ORAL | Status: DC

## 2011-05-12 MED ORDER — ESCITALOPRAM OXALATE 10 MG PO TABS: 10.0000 mg | ORAL_TABLET | Freq: Every day | ORAL | Status: DC

## 2011-05-12 NOTE — Patient Instructions (Addendum)
Take all new medications as prescribed - the amlodipine 5 mg for blood pressure Increase the lexapro to 20 mg per day You will be contacted regarding the referral for: Kidney artery ultrasound to see if there is blockage Continue all other medications as before Please return in 1 month, or sooner if needed You are given the work note today  Please show this to the pharmacist at [facility]  - OK to disregard the 10 mg lexapro rx from today (was sent by accident); you should take the 20 mg instead

## 2011-05-14 NOTE — Assessment & Plan Note (Signed)
stable overall by hx and exam,, and pt to continue medical treatment as before - to re-start med for seasonal flare

## 2011-05-14 NOTE — Assessment & Plan Note (Signed)
Ok to increase the lexapro to 20 mg,declines counseling, f/u here in 1 mo

## 2011-05-14 NOTE — Progress Notes (Signed)
Subjective:    Patient ID: Kristie Nelson, female    DOB: 1980/11/11, 31 y.o.   MRN: [id number]  HPI  Here to f/u;  Does have several wks ongoing nasal allergy symptoms with clear congestion, itch and sneeze, without fever, pain, ST, cough or wheezing, but better with prn allegra use, has not used nasonex recent.  Also with increased stress recently with anxiety, tearful and overwhelmed on many days in the past month, Denies worsening depressive symptoms, suicidal ideation, or panic.  Does get dizzy and sob with increased stress.  Does not like her job and would like to quit very much but needs the income.  Sleep somewhat difficult as well, not using tofranil recent.  BP has been intermittently elevated mildly as well, more high than normal having check several times at home. Pt denies chest pain, increased sob or doe, wheezing, orthopnea, PND, increased LE swelling, palpitations,  or syncope. Pt denies new neurological symptoms such as new headache, or facial or extremity weakness or numbness   Pt denies polydipsia, polyuria. Past Medical History  Diagnosis Date  . ECZEMA 03/19/2009  . HYPERLIPIDEMIA 12/09/2009  . ANXIETY 04/03/2007  . DEPRESSION 04/03/2007  . MIGRAINE, COMMON 12/09/2009  . HYPERTENSION 04/03/2007  . ALLERGIC RHINITIS 04/03/2007   No past surgical history on file.  reports that she has never smoked. She does not have any smokeless tobacco history on file. She reports that she does not drink alcohol or use illicit drugs. family history includes Diabetes in her mother; Hypertension in her other; and Thyroid disease in her other. Allergies  Allergen Reactions  . Peanut-Containing Drug Products     REACTION: throat swelling  . Penicillins     REACTION: itching   Current Outpatient Prescriptions on File Prior to Visit  Medication Sig Dispense Refill  . fexofenadine (ALLEGRA) 180 MG tablet Take 1 tablet (180 mg total) by mouth daily.  30 tablet  11  . hydrochlorothiazide  (HYDRODIURIL) 25 MG tablet TAKE ONE TABLET BY MOUTH EVERY DAY  30 tablet  10  . labetalol (NORMODYNE) 200 MG tablet 2 tabs by mouth twice per day  120 tablet  11  . mometasone (NASONEX) 50 MCG/ACT nasal spray Place 2 sprays into the nose daily.  17 g  12  . Multiple Vitamin (MULTIVITAMIN) capsule Take 1 capsule by mouth daily.        . naproxen (NAPROSYN) 500 MG tablet Take 1 tablet (500 mg total) by mouth 2 (two) times daily with a meal.  60 tablet  2  . triamcinolone (KENALOG) 0.1 % cream Apply topically 2 (two) times daily. Use as directed  30 g  1  . amLODipine (NORVASC) 5 MG tablet Take 1 tablet (5 mg total) by mouth daily.  90 tablet  3  . imipramine (TOFRANIL) 25 MG tablet Take 25 mg by mouth 2 (two) times daily.         Review of Systems Review of Systems  Constitutional: Negative for diaphoresis and unexpected weight change.    Eyes: Negative for photophobia and visual disturbance.  Respiratory: Negative for choking and stridor.   Gastrointestinal: Negative for vomiting and blood in stool.  Genitourinary: Negative for hematuria and decreased urine volume.  Musculoskeletal: Negative for gait problem.  Skin: Negative for color change and wound.  Neurological: Negative for tremors and numbness.  Psychiatric/Behavioral: Negative for decreased concentration. The patient is not hyperactive.      Objective:   Physical Exam BP 146/92  Pulse 91  Temp(Src) 98.1 F (36.7 C) (Oral)  Ht 5\' 2"  (1.575 m)  Wt 130 lb (58.968 kg)  BMI 23.78 kg/m2  SpO2 98%  LMP 04/04/2011 Physical Exam  VS noted Constitutional: Pt appears well-developed and well-nourished.  HENT: Head: Normocephalic.  Right Ear: External ear normal.  Left Ear: External ear normal.  Bilat tm's mild erythema.  Sinus nontender.  Pharynx mild erythema Eyes: Conjunctivae and EOM are normal. Pupils are equal, round, and reactive to light.  Neck: Normal range of motion. Neck supple.  Cardiovascular: Normal rate and regular  rhythm.   Pulmonary/Chest: Effort normal and breath sounds normal.  Abd:  Soft, NT, non-distended, + BS, no bruit Neurological: Pt is alert. Not confused;  Motor/gait intact  Skin: Skin is warm. No erythema.  Psychiatric: Pt behavior is normal. Thought content normal. 1+ nervous, not tearful today, somewhat ? dysphoric    Assessment & Plan:

## 2011-05-14 NOTE — Assessment & Plan Note (Signed)
Mild uncontrolled, to add amlodipine 5 qd, cont other meds, check for RAS with renal artery u/s, f/u 1 month

## 2011-05-25 ENCOUNTER — Other Ambulatory Visit: Payer: Self-pay | Admitting: Internal Medicine

## 2011-05-25 DIAGNOSIS — I10 Essential (primary) hypertension: Secondary | ICD-10-CM

## 2011-06-03 ENCOUNTER — Telehealth: Payer: Self-pay | Admitting: Internal Medicine

## 2011-06-03 NOTE — Telephone Encounter (Signed)
Appt. On Saturday clinic.

## 2011-06-03 NOTE — Telephone Encounter (Signed)
Ok for add on today.

## 2011-06-03 NOTE — Telephone Encounter (Signed)
Caller: Tangy/Patient; PCP: Oliver Barre; CB#: [phone]; ; ; Call regarding Sore Throat;  Pt calling regarding cough, congestion and sore throat for a week. Afebrile. Trx with Aleeve x 1. Pt barely has a voice and noticed white patches in throat. Disp: See provider w/in 24 hrs per Sore Throat or Hoarseness protocol. No appt avail for 5/31 pm; Spoke with Dalia who suggested sending a note to Robin, Dr. Raphael Gibney RN to call pt back after 1 pm when Sat Schedule opens and book pt then. Pt call back # is listed above.

## 2011-06-04 ENCOUNTER — Encounter: Payer: Self-pay | Admitting: Family Medicine

## 2011-06-04 ENCOUNTER — Ambulatory Visit (INDEPENDENT_AMBULATORY_CARE_PROVIDER_SITE_OTHER): Payer: BC Managed Care – PPO | Admitting: Family Medicine

## 2011-06-04 VITALS — BP 110/60 | HR 85 | Temp 98.1°F | Ht 62.0 in | Wt 130.0 lb

## 2011-06-04 DIAGNOSIS — J019 Acute sinusitis, unspecified: Secondary | ICD-10-CM

## 2011-06-04 DIAGNOSIS — J209 Acute bronchitis, unspecified: Secondary | ICD-10-CM

## 2011-06-04 MED ORDER — GUAIFENESIN-CODEINE 100-10 MG/5ML PO SOLN: 5.0000 mL | Freq: Every evening | ORAL | Status: AC | PRN

## 2011-06-04 MED ORDER — AZITHROMYCIN 250 MG PO TABS: ORAL_TABLET | ORAL | Status: AC

## 2011-06-04 NOTE — Progress Notes (Signed)
  Subjective:    Patient ID: Kristie Nelson, female    DOB: 05-05-1980, 31 y.o.   MRN: [id number]  Cough This is a new problem. The current episode started in the past 7 days. The problem has been gradually worsening. The cough is productive of sputum. Associated symptoms include rhinorrhea and a sore throat. Pertinent negatives include no fever. Associated symptoms comments: Sneeze,Hoarse voice  trouble sleeping at night with cough. She has tried OTC cough suppressant (mucinex) for the symptoms. The treatment provided no relief. There is no history of asthma, COPD, emphysema or environmental allergies. nonsmoker      Review of Systems  Constitutional: Negative for fever.  HENT: Positive for sore throat and rhinorrhea.   Hematological: Negative for environmental allergies.       Objective:   Physical Exam  Constitutional: Vital signs are normal. She appears well-developed and well-nourished. She is cooperative.  Non-toxic appearance. She does not appear ill. No distress.  HENT:  Head: Normocephalic.  Right Ear: Hearing, tympanic membrane, external ear and ear canal normal. Tympanic membrane is not erythematous, not retracted and not bulging.  Left Ear: Hearing, tympanic membrane, external ear and ear canal normal. Tympanic membrane is not erythematous, not retracted and not bulging.  Nose: Mucosal edema and rhinorrhea present. Right sinus exhibits no maxillary sinus tenderness and no frontal sinus tenderness. Left sinus exhibits no maxillary sinus tenderness and no frontal sinus tenderness.  Mouth/Throat: Uvula is midline, oropharynx is clear and moist and mucous membranes are normal.  Eyes: Conjunctivae, EOM and lids are normal. Pupils are equal, round, and reactive to light. No foreign bodies found.  Neck: Trachea normal and normal range of motion. Neck supple. Carotid bruit is not present. No mass and no thyromegaly present.  Cardiovascular: Normal rate, regular rhythm, S1 normal, S2  normal, normal heart sounds, intact distal pulses and normal pulses.  Exam reveals no gallop and no friction rub.   No murmur heard. Pulmonary/Chest: Effort normal and breath sounds normal. Not tachypneic. No respiratory distress. She has no decreased breath sounds. She has no wheezes. She has no rhonchi. She has no rales.  Neurological: She is alert.  Skin: Skin is warm, dry and intact. No rash noted.  Psychiatric: Her speech is normal and behavior is normal. Judgment normal. Her mood appears not anxious. Cognition and memory are normal. She does not exhibit a depressed mood.          Assessment & Plan:

## 2011-06-04 NOTE — Patient Instructions (Signed)
Complete antibiotics course. Call if not improving as expected.

## 2011-06-04 NOTE — Assessment & Plan Note (Signed)
Given >7 days symptoms will treat for bacterial bronchitis with Z-pak. Codiene guaifenesin for cough suppressant.

## 2011-06-16 ENCOUNTER — Ambulatory Visit: Payer: BC Managed Care – PPO | Admitting: Internal Medicine

## 2011-06-16 DIAGNOSIS — Z0289 Encounter for other administrative examinations: Secondary | ICD-10-CM

## 2011-07-21 ENCOUNTER — Other Ambulatory Visit: Payer: BC Managed Care – PPO

## 2011-11-14 ENCOUNTER — Ambulatory Visit (INDEPENDENT_AMBULATORY_CARE_PROVIDER_SITE_OTHER): Payer: BC Managed Care – PPO | Admitting: Internal Medicine

## 2011-11-14 ENCOUNTER — Encounter: Payer: Self-pay | Admitting: Internal Medicine

## 2011-11-14 VITALS — BP 112/80 | HR 83 | Temp 97.4°F | Ht 62.0 in | Wt 134.0 lb

## 2011-11-14 DIAGNOSIS — Z0000 Encounter for general adult medical examination without abnormal findings: Secondary | ICD-10-CM

## 2011-11-14 DIAGNOSIS — F329 Major depressive disorder, single episode, unspecified: Secondary | ICD-10-CM

## 2011-11-14 DIAGNOSIS — J209 Acute bronchitis, unspecified: Secondary | ICD-10-CM | POA: Insufficient documentation

## 2011-11-14 DIAGNOSIS — I10 Essential (primary) hypertension: Secondary | ICD-10-CM

## 2011-11-14 MED ORDER — ESCITALOPRAM OXALATE 20 MG PO TABS: 20.0000 mg | ORAL_TABLET | Freq: Every day | ORAL | Status: DC

## 2011-11-14 MED ORDER — HYDROCODONE-HOMATROPINE 5-1.5 MG/5ML PO SYRP: 5.0000 mL | ORAL_SOLUTION | Freq: Four times a day (QID) | ORAL | Status: DC | PRN

## 2011-11-14 MED ORDER — AZITHROMYCIN 250 MG PO TABS: ORAL_TABLET | ORAL | Status: DC

## 2011-11-14 NOTE — Assessment & Plan Note (Signed)
stable overall by hx and exam, most recent data reviewed with pt, and pt to continue medical treatment as before BP Readings from Last 3 Encounters:  11/14/11 112/80  06/04/11 110/60  05/12/11 146/92

## 2011-11-14 NOTE — Patient Instructions (Addendum)
Take all new medications as prescribed Continue all other medications as before You are given the work note Please return in 6 mo with Lab testing done 3-5 days before

## 2011-11-14 NOTE — Assessment & Plan Note (Signed)
Mild to mod, for antibx course,  to f/u any worsening symptoms or concerns 

## 2011-11-14 NOTE — Assessment & Plan Note (Signed)
stable overall by hx and exam, most recent data reviewed with pt, and pt to continue medical treatment as before Lab Results  Component Value Date   WBC 5.2 01/01/2010   HGB 12.9 01/01/2010   HCT 37.2 01/01/2010   PLT 221.0 01/01/2010   GLUCOSE 77 01/01/2010   CHOL 196 01/01/2010   TRIG 46.0 01/01/2010   HDL 49.10 01/01/2010   LDLDIRECT 109.3 10/17/2007   LDLCALC 138* 01/01/2010   ALT 11 01/01/2010   AST 19 01/01/2010   NA 140 01/01/2010   K 3.6 01/01/2010   CL 103 01/01/2010   CREATININE 0.6 01/01/2010   BUN 14 01/01/2010   CO2 29 01/01/2010   TSH 0.96 01/01/2010

## 2011-11-14 NOTE — Progress Notes (Signed)
Subjective:    Patient ID: Kristie Nelson, female    DOB: 02-Oct-1980, 31 y.o.   MRN: [id number]  HPI  Here with acute onset mild to mod 2-3 days ST, HA, general weakness and malaise, with prod cough greenish sputum, but Pt denies chest pain, increased sob or doe, wheezing, orthopnea, PND, increased LE swelling, palpitations, dizziness or syncope.  Denies worsening depressive symptoms, suicidal ideation, or panic, though has ongoing anxiety, stable on the lexapro 20 mg. Has gained several lbs.  Pt denies new neurological symptoms such as new headache, or facial or extremity weakness or numbness   Pt denies polydipsia, polyuria Past Medical History  Diagnosis Date  . ECZEMA 03/19/2009  . HYPERLIPIDEMIA 12/09/2009  . ANXIETY 04/03/2007  . DEPRESSION 04/03/2007  . MIGRAINE, COMMON 12/09/2009  . HYPERTENSION 04/03/2007  . ALLERGIC RHINITIS 04/03/2007   No past surgical history on file.  reports that she has never smoked. She has never used smokeless tobacco. She reports that she does not drink alcohol or use illicit drugs. family history includes Diabetes in her mother; Hypertension in her other; and Thyroid disease in her other. Allergies  Allergen Reactions  . Peanut-Containing Drug Products     REACTION: throat swelling  . Penicillins     REACTION: itching   Current Outpatient Prescriptions on File Prior to Visit  Medication Sig Dispense Refill  . amLODipine (NORVASC) 5 MG tablet Take 1 tablet (5 mg total) by mouth daily.  90 tablet  3  . fexofenadine (ALLEGRA) 180 MG tablet Take 1 tablet (180 mg total) by mouth daily.  30 tablet  11  . hydrochlorothiazide (HYDRODIURIL) 25 MG tablet TAKE ONE TABLET BY MOUTH EVERY DAY  30 tablet  10  . imipramine (TOFRANIL) 25 MG tablet Take 25 mg by mouth 2 (two) times daily.        Marland Kitchen labetalol (NORMODYNE) 200 MG tablet 2 tabs by mouth twice per day  120 tablet  11  . mometasone (NASONEX) 50 MCG/ACT nasal spray Place 2 sprays into the nose daily.  17 g  12  .  Multiple Vitamin (MULTIVITAMIN) capsule Take 1 capsule by mouth daily.        . naproxen (NAPROSYN) 500 MG tablet Take 1 tablet (500 mg total) by mouth 2 (two) times daily with a meal.  60 tablet  2  . triamcinolone (KENALOG) 0.1 % cream Apply topically 2 (two) times daily. Use as directed  30 g  1   Review of Systems  Constitutional: Negative for diaphoresis and unexpected weight change.  HENT: Negative for tinnitus.   Eyes: Negative for photophobia and visual disturbance.  Respiratory: Negative for choking and stridor.   Gastrointestinal: Negative for vomiting and blood in stool.  Genitourinary: Negative for hematuria and decreased urine volume.  Musculoskeletal: Negative for gait problem.  Skin: Negative for color change and wound.  Neurological: Negative for tremors and numbness.  Psychiatric/Behavioral: Negative for decreased concentration. The patient is not hyperactive.       Objective:   Physical Exam BP 112/80  Pulse 83  Temp 97.4 F (36.3 C) (Oral)  Ht 5\' 2"  (1.575 m)  Wt 134 lb (60.782 kg)  BMI 24.51 kg/m2  SpO2 97% Physical Exam  VS noted, mld ill Constitutional: Pt appears well-developed and well-nourished.  HENT: Head: Normocephalic.  Right Ear: External ear normal.  Left Ear: External ear normal.  Bilat tm's mild erythema.  Sinus nontender.  Pharynx mild erythema Eyes: Conjunctivae and EOM are normal. Pupils  are equal, round, and reactive to light.  Neck: Normal range of motion. Neck supple.  Cardiovascular: Normal rate and regular rhythm.   Pulmonary/Chest: Effort normal and breath sounds normal.  Neurological: Pt is alert. Not confused  Skin: Skin is warm. No erythema.  Psychiatric: Pt behavior is normal. Thought content normal. 1+ nervous    Assessment & Plan:

## 2012-01-13 ENCOUNTER — Telehealth: Payer: Self-pay | Admitting: *Deleted

## 2012-01-13 NOTE — Telephone Encounter (Signed)
Called left message to call back 

## 2012-01-13 NOTE — Telephone Encounter (Signed)
Ok for shot

## 2012-01-13 NOTE — Telephone Encounter (Signed)
Patient informed. 

## 2012-01-13 NOTE — Telephone Encounter (Signed)
Pt wants to know if we have preservative free flu shot since she is pregnant- (we have preservative free immunization)okay for pt to placed on Nurse Visit Schedule for injection?

## 2012-02-21 LAB — OB RESULTS CONSOLE HIV ANTIBODY (ROUTINE TESTING): HIV: NONREACTIVE

## 2012-02-21 LAB — OB RESULTS CONSOLE RPR: RPR: NONREACTIVE

## 2012-02-21 LAB — OB RESULTS CONSOLE ABO/RH: RH Type: POSITIVE

## 2012-05-14 ENCOUNTER — Ambulatory Visit: Payer: BC Managed Care – PPO | Admitting: Internal Medicine

## 2012-05-14 DIAGNOSIS — Z0289 Encounter for other administrative examinations: Secondary | ICD-10-CM

## 2012-05-29 ENCOUNTER — Telehealth: Payer: Self-pay | Admitting: Internal Medicine

## 2012-05-29 NOTE — Telephone Encounter (Signed)
The patient is hoping to get her no-show charge removed.  She stated she did not know she had an appointment.  Her callback number is [phone]  Thanks!

## 2012-08-14 ENCOUNTER — Other Ambulatory Visit: Payer: Self-pay | Admitting: Obstetrics and Gynecology

## 2012-08-24 ENCOUNTER — Encounter (HOSPITAL_COMMUNITY): Payer: Self-pay

## 2012-08-27 ENCOUNTER — Encounter (HOSPITAL_COMMUNITY): Payer: Self-pay

## 2012-08-27 ENCOUNTER — Encounter (HOSPITAL_COMMUNITY)
Admission: RE | Admit: 2012-08-27 | Discharge: 2012-08-27 | Disposition: A | Discharge status: Home / Self Care | Payer: BC Managed Care – PPO | Source: Ambulatory Visit | Attending: Obstetrics and Gynecology | Admitting: Obstetrics and Gynecology

## 2012-08-27 HISTORY — DX: Other specified pregnancy related conditions, unspecified trimester: O26.899

## 2012-08-27 HISTORY — DX: Heartburn: R12

## 2012-08-27 HISTORY — DX: Cardiac arrhythmia, unspecified: I49.9

## 2012-08-27 HISTORY — DX: Shortness of breath: R06.02

## 2012-08-27 LAB — COMPREHENSIVE METABOLIC PANEL
AST: 16 U/L (ref 0–37)
Albumin: 3.1 g/dL — ABNORMAL LOW (ref 3.5–5.2)
Alkaline Phosphatase: 100 U/L (ref 39–117)
BUN: 5 mg/dL — ABNORMAL LOW (ref 6–23)
Chloride: 100 mEq/L (ref 96–112)
Potassium: 3.3 mEq/L — ABNORMAL LOW (ref 3.5–5.1)
Total Bilirubin: 0.3 mg/dL (ref 0.3–1.2)

## 2012-08-27 LAB — CBC
HCT: 30.5 % — ABNORMAL LOW (ref 36.0–46.0)
RBC: 3.47 MIL/uL — ABNORMAL LOW (ref 3.87–5.11)
RDW: 13.2 % (ref 11.5–15.5)
WBC: 8.2 10*3/uL (ref 4.0–10.5)

## 2012-08-27 LAB — ABO/RH: ABO/RH(D): A POS

## 2012-08-27 NOTE — Patient Instructions (Addendum)
Your procedure is scheduled on:08/29/12  Enter through the Main Entrance at :1pm Pick up desk phone and dial [phone] and inform us of your arrival.  Please call [phone] if you have any problems the morning of surgery.  Remember: Do not eat food  after midnight: TUESDAY Clear liquids are ok until:1030 am WED   You may brush your teeth the morning of surgery.  Take these meds the morning of surgery with a sip of water:Labetalol  DO NOT wear jewelry, eye make-up, lipstick,body lotion, or dark fingernail polish.  (Polished toes are ok) You may wear deodorant.  If you are to be admitted after surgery, leave suitcase in car until your room has been assigned. Patients discharged on the day of surgery will not be allowed to drive home. Wear loose fitting, comfortable clothes for your ride home.

## 2012-08-28 LAB — RPR: RPR Ser Ql: NONREACTIVE

## 2012-08-28 MED ORDER — GENTAMICIN SULFATE 40 MG/ML IJ SOLN
INTRAVENOUS | Status: AC
  Administered 2012-08-29: 100 mL via INTRAVENOUS
  Filled 2012-08-28: qty 7.68

## 2012-08-28 NOTE — H&P (Signed)
Kristie Nelson, Kristie Nelson                ACCOUNT NO.:  [id number]  MEDICAL RECORD NO.:  [id number]  LOCATION:  [facility]                         FACILITY:  [facility]  PHYSICIAN:  Lenoard Aden, M.D.DATE OF BIRTH:  10-05-1980  DATE OF ADMISSION:  07/10/2012 DATE OF DISCHARGE:                             HISTORY & PHYSICAL   CHIEF COMPLAINT: 1. Previous C-section for repeat. 2. History of chronic hypertension with IUGR.  HISTORY OF PRESENT ILLNESS:  The patient is a 32 year old African American female, G3, P1, at 55 and 2/7th weeks' gestation who presents for repeat low-segment transverse cesarean section.  The patient's pregnancy complicated by hypertension requiring increased labetalol doses and IUGR with lagging abdominal wall circumference left on the 10% percentile on serial sonography.  MEDICATIONS:  Prenatal vitamins and labetalol.  ALLERGIES:  She has allergies to PEANUT BUTTER, PENICILLIN, SHELLFISH, LATEX, BLUE CHEESE and PORK.  She has a past obstetric history remarkable for a previous C-section.  MEDICATIONS:  Lisinopril and Zantac p.r.n.  FAMILY HISTORY:  She has a family history of hypertension, diabetes, liver cancer, and myocardial infarction.  Prenatal course complicated by difficult to control hypertension, IUGR.  PHYSICAL EXAMINATION:  GENERAL:  She is a well-developed, well- nourished, African American female, in no acute distress. HEENT:  Normal. NECK:  Supple.  Full range of motion. LUNGS:  Clear. HEART:  Regular rate and rhythm. ABDOMEN:  Soft, gravid, nontender.  Estimated fetal weight 5-1/2 to 6 pounds.  Cervix is closed, 50%, vertex, -2. EXTREMITIES:  There are no cords. NEUROLOGIC:  Nonfocal. SKIN:  Intact.  LABORATORY DATA:  CBC and CMP pending.  IMPRESSION: 1. A 37-week intrauterine pregnancy. 2. Complicated chronic hypertension requiring escalating labetalol     dose. 3. Asymmetric intrauterine growth restriction.  PLAN:  Proceed with repeat  low-segment transverse cesarean section. Risks of anesthesia, infection, bleeding, injury to surrounding organs, delayed versus immediate complications including bowel and bladder injury noted.  The patient acknowledges and wishes to proceed.     Lenoard Aden, M.D.     RJT/MEDQ  D:  08/28/2012  T:  08/28/2012  Job:  [id number]

## 2012-08-29 ENCOUNTER — Encounter (HOSPITAL_COMMUNITY): Payer: Self-pay | Admitting: Anesthesiology

## 2012-08-29 ENCOUNTER — Encounter (HOSPITAL_COMMUNITY): Payer: Self-pay | Admitting: *Deleted

## 2012-08-29 ENCOUNTER — Encounter (HOSPITAL_COMMUNITY)
Admission: AD | Disposition: A | Discharge status: Home / Self Care | Payer: Self-pay | Source: Ambulatory Visit | Attending: Obstetrics and Gynecology

## 2012-08-29 ENCOUNTER — Inpatient Hospital Stay (HOSPITAL_COMMUNITY)
Admission: AD | Admit: 2012-08-29 | Discharge: 2012-09-01 | DRG: 370 | Disposition: A | Discharge status: Home / Self Care | Payer: BC Managed Care – PPO | Source: Ambulatory Visit | Attending: Obstetrics and Gynecology | Admitting: Obstetrics and Gynecology

## 2012-08-29 ENCOUNTER — Inpatient Hospital Stay (HOSPITAL_COMMUNITY): Payer: BC Managed Care – PPO | Admitting: Anesthesiology

## 2012-08-29 DIAGNOSIS — O365990 Maternal care for other known or suspected poor fetal growth, unspecified trimester, not applicable or unspecified: Secondary | ICD-10-CM | POA: Diagnosis present

## 2012-08-29 DIAGNOSIS — D62 Acute posthemorrhagic anemia: Secondary | ICD-10-CM | POA: Diagnosis not present

## 2012-08-29 DIAGNOSIS — O9903 Anemia complicating the puerperium: Secondary | ICD-10-CM | POA: Diagnosis not present

## 2012-08-29 DIAGNOSIS — O1002 Pre-existing essential hypertension complicating childbirth: Secondary | ICD-10-CM | POA: Diagnosis present

## 2012-08-29 DIAGNOSIS — O34219 Maternal care for unspecified type scar from previous cesarean delivery: Principal | ICD-10-CM | POA: Diagnosis present

## 2012-08-29 HISTORY — DX: Acute posthemorrhagic anemia: D62

## 2012-08-29 SURGERY — Surgical Case
Anesthesia: Spinal | Site: Abdomen | Wound class: Clean Contaminated

## 2012-08-29 MED ORDER — LABETALOL HCL 5 MG/ML IV SOLN
INTRAVENOUS | Status: AC
  Administered 2012-08-29: 10 mg via INTRAVENOUS
  Filled 2012-08-29: qty 4

## 2012-08-29 MED ORDER — HYDROMORPHONE HCL PF 1 MG/ML IJ SOLN
INTRAMUSCULAR | Status: AC
  Administered 2012-08-29: 0.5 mg via INTRAVENOUS
  Filled 2012-08-29: qty 1

## 2012-08-29 MED ORDER — NALBUPHINE HCL 10 MG/ML IJ SOLN: 5.0000 mg | INTRAMUSCULAR | Status: DC | PRN

## 2012-08-29 MED ORDER — BUPIVACAINE HCL (PF) 0.25 % IJ SOLN
INTRAMUSCULAR | Status: AC
  Filled 2012-08-29: qty 30

## 2012-08-29 MED ORDER — MORPHINE SULFATE (PF) 0.5 MG/ML IJ SOLN
INTRAMUSCULAR | Status: DC | PRN
  Administered 2012-08-29: .2 mg via INTRATHECAL

## 2012-08-29 MED ORDER — METOCLOPRAMIDE HCL 5 MG/ML IJ SOLN: 10.0000 mg | Freq: Three times a day (TID) | INTRAMUSCULAR | Status: DC | PRN

## 2012-08-29 MED ORDER — MENTHOL 3 MG MT LOZG: 1.0000 | LOZENGE | OROMUCOSAL | Status: DC | PRN

## 2012-08-29 MED ORDER — WITCH HAZEL-GLYCERIN EX PADS: 1.0000 "application " | MEDICATED_PAD | CUTANEOUS | Status: DC | PRN

## 2012-08-29 MED ORDER — LANOLIN HYDROUS EX OINT: 1.0000 "application " | TOPICAL_OINTMENT | CUTANEOUS | Status: DC | PRN

## 2012-08-29 MED ORDER — SCOPOLAMINE 1 MG/3DAYS TD PT72
MEDICATED_PATCH | TRANSDERMAL | Status: AC
  Administered 2012-08-29: 1.5 mg via TRANSDERMAL
  Filled 2012-08-29: qty 1

## 2012-08-29 MED ORDER — LACTATED RINGERS IV SOLN
INTRAVENOUS | Status: DC
  Administered 2012-08-29 (×3): via INTRAVENOUS

## 2012-08-29 MED ORDER — MEPERIDINE HCL 25 MG/ML IJ SOLN: 6.2500 mg | INTRAMUSCULAR | Status: DC | PRN

## 2012-08-29 MED ORDER — KETOROLAC TROMETHAMINE 30 MG/ML IJ SOLN: 30.0000 mg | Freq: Four times a day (QID) | INTRAMUSCULAR | Status: AC | PRN

## 2012-08-29 MED ORDER — ONDANSETRON HCL 4 MG/2ML IJ SOLN
INTRAMUSCULAR | Status: DC | PRN
  Administered 2012-08-29: 4 mg via INTRAVENOUS

## 2012-08-29 MED ORDER — IBUPROFEN 600 MG PO TABS
600.0000 mg | ORAL_TABLET | Freq: Four times a day (QID) | ORAL | Status: DC
  Administered 2012-08-29 – 2012-09-01 (×10): 600 mg via ORAL
  Filled 2012-08-29 (×10): qty 1

## 2012-08-29 MED ORDER — LABETALOL HCL 5 MG/ML IV SOLN
INTRAVENOUS | Status: AC
  Administered 2012-08-29: 20 mg via INTRAVENOUS
  Filled 2012-08-29: qty 4

## 2012-08-29 MED ORDER — MIDAZOLAM HCL 2 MG/2ML IJ SOLN: 0.5000 mg | Freq: Once | INTRAMUSCULAR | Status: DC | PRN

## 2012-08-29 MED ORDER — FENTANYL CITRATE 0.05 MG/ML IJ SOLN
INTRAMUSCULAR | Status: DC | PRN
  Administered 2012-08-29: 12.5 ug via INTRATHECAL

## 2012-08-29 MED ORDER — BUPIVACAINE IN DEXTROSE 0.75-8.25 % IT SOLN
INTRATHECAL | Status: DC | PRN
  Administered 2012-08-29: 1.4 mL via INTRATHECAL

## 2012-08-29 MED ORDER — HYDROMORPHONE HCL PF 1 MG/ML IJ SOLN
0.2500 mg | INTRAMUSCULAR | Status: DC | PRN
  Administered 2012-08-29 (×3): 0.5 mg via INTRAVENOUS

## 2012-08-29 MED ORDER — 0.9 % SODIUM CHLORIDE (POUR BTL) OPTIME
TOPICAL | Status: DC | PRN
  Administered 2012-08-29: 1000 mL

## 2012-08-29 MED ORDER — LABETALOL HCL 300 MG PO TABS
600.0000 mg | ORAL_TABLET | Freq: Once | ORAL | Status: AC
  Administered 2012-08-29: 600 mg via ORAL
  Filled 2012-08-29: qty 2

## 2012-08-29 MED ORDER — OXYCODONE-ACETAMINOPHEN 5-325 MG PO TABS
1.0000 | ORAL_TABLET | ORAL | Status: DC | PRN
  Administered 2012-08-30 (×3): 1 via ORAL
  Administered 2012-08-31: 2 via ORAL
  Administered 2012-08-31 – 2012-09-01 (×3): 1 via ORAL
  Filled 2012-08-29 (×5): qty 1
  Filled 2012-08-29: qty 2
  Filled 2012-08-29 (×2): qty 1

## 2012-08-29 MED ORDER — DIPHENHYDRAMINE HCL 25 MG PO CAPS
25.0000 mg | ORAL_CAPSULE | ORAL | Status: DC | PRN
  Administered 2012-08-29: 25 mg via ORAL
  Filled 2012-08-29: qty 1

## 2012-08-29 MED ORDER — DIBUCAINE 1 % RE OINT: 1.0000 "application " | TOPICAL_OINTMENT | RECTAL | Status: DC | PRN

## 2012-08-29 MED ORDER — LABETALOL HCL 5 MG/ML IV SOLN
20.0000 mg | Freq: Once | INTRAVENOUS | Status: AC
  Administered 2012-08-29: 20 mg via INTRAVENOUS

## 2012-08-29 MED ORDER — OXYTOCIN 10 UNIT/ML IJ SOLN
40.0000 [IU] | INTRAVENOUS | Status: DC | PRN
  Administered 2012-08-29: 40 [IU] via INTRAVENOUS

## 2012-08-29 MED ORDER — FENTANYL CITRATE 0.05 MG/ML IJ SOLN
INTRAMUSCULAR | Status: AC
  Filled 2012-08-29: qty 2

## 2012-08-29 MED ORDER — KETOROLAC TROMETHAMINE 30 MG/ML IJ SOLN: 15.0000 mg | Freq: Once | INTRAMUSCULAR | Status: DC | PRN

## 2012-08-29 MED ORDER — BUPIVACAINE HCL (PF) 0.25 % IJ SOLN
INTRAMUSCULAR | Status: DC | PRN
  Administered 2012-08-29: 30 mL

## 2012-08-29 MED ORDER — ONDANSETRON HCL 4 MG/2ML IJ SOLN: 4.0000 mg | INTRAMUSCULAR | Status: DC | PRN

## 2012-08-29 MED ORDER — DIPHENHYDRAMINE HCL 50 MG/ML IJ SOLN: 25.0000 mg | INTRAMUSCULAR | Status: DC | PRN

## 2012-08-29 MED ORDER — KETOROLAC TROMETHAMINE 60 MG/2ML IM SOLN
INTRAMUSCULAR | Status: AC
Start: 2012-08-29 — End: 2012-08-29
  Administered 2012-08-29: 60 mg via INTRAMUSCULAR
  Filled 2012-08-29: qty 2

## 2012-08-29 MED ORDER — ZOLPIDEM TARTRATE 5 MG PO TABS: 5.0000 mg | ORAL_TABLET | Freq: Every evening | ORAL | Status: DC | PRN

## 2012-08-29 MED ORDER — LACTATED RINGERS IV SOLN
Freq: Once | INTRAVENOUS | Status: AC
  Administered 2012-08-29: 14:00:00 via INTRAVENOUS

## 2012-08-29 MED ORDER — LABETALOL HCL 300 MG PO TABS
600.0000 mg | ORAL_TABLET | Freq: Three times a day (TID) | ORAL | Status: DC
  Administered 2012-08-29 – 2012-09-01 (×8): 600 mg via ORAL
  Filled 2012-08-29 (×7): qty 2

## 2012-08-29 MED ORDER — SENNOSIDES-DOCUSATE SODIUM 8.6-50 MG PO TABS
2.0000 | ORAL_TABLET | Freq: Every day | ORAL | Status: DC
  Administered 2012-08-29 – 2012-08-31 (×3): 2 via ORAL

## 2012-08-29 MED ORDER — LABETALOL HCL 5 MG/ML IV SOLN
10.0000 mg | Freq: Once | INTRAVENOUS | Status: AC
  Administered 2012-08-29: 10 mg via INTRAVENOUS

## 2012-08-29 MED ORDER — OXYTOCIN 40 UNITS IN LACTATED RINGERS INFUSION - SIMPLE MED: 62.5000 mL/h | INTRAVENOUS | Status: AC

## 2012-08-29 MED ORDER — MORPHINE SULFATE 0.5 MG/ML IJ SOLN
INTRAMUSCULAR | Status: AC
  Filled 2012-08-29: qty 10

## 2012-08-29 MED ORDER — SIMETHICONE 80 MG PO CHEW: 80.0000 mg | CHEWABLE_TABLET | ORAL | Status: DC | PRN

## 2012-08-29 MED ORDER — ONDANSETRON HCL 4 MG/2ML IJ SOLN: 4.0000 mg | Freq: Three times a day (TID) | INTRAMUSCULAR | Status: DC | PRN

## 2012-08-29 MED ORDER — SCOPOLAMINE 1 MG/3DAYS TD PT72
1.0000 | MEDICATED_PATCH | Freq: Once | TRANSDERMAL | Status: DC
  Administered 2012-08-29: 1.5 mg via TRANSDERMAL

## 2012-08-29 MED ORDER — LACTATED RINGERS IV SOLN
INTRAVENOUS | Status: DC
  Administered 2012-08-30: 01:00:00 via INTRAVENOUS

## 2012-08-29 MED ORDER — DIPHENHYDRAMINE HCL 25 MG PO CAPS: 25.0000 mg | ORAL_CAPSULE | Freq: Four times a day (QID) | ORAL | Status: DC | PRN

## 2012-08-29 MED ORDER — SODIUM CHLORIDE 0.9 % IJ SOLN: 3.0000 mL | INTRAMUSCULAR | Status: DC | PRN

## 2012-08-29 MED ORDER — DIPHENHYDRAMINE HCL 50 MG/ML IJ SOLN: 12.5000 mg | INTRAMUSCULAR | Status: DC | PRN

## 2012-08-29 MED ORDER — ONDANSETRON HCL 4 MG PO TABS: 4.0000 mg | ORAL_TABLET | ORAL | Status: DC | PRN

## 2012-08-29 MED ORDER — NALOXONE HCL 1 MG/ML IJ SOLN: 1.0000 ug/kg/h | INTRAVENOUS | Status: DC | PRN

## 2012-08-29 MED ORDER — PRENATAL MULTIVITAMIN CH
1.0000 | ORAL_TABLET | Freq: Every day | ORAL | Status: DC
  Administered 2012-08-30 – 2012-08-31 (×2): 1 via ORAL
  Filled 2012-08-29 (×2): qty 1

## 2012-08-29 MED ORDER — NALOXONE HCL 0.4 MG/ML IJ SOLN: 0.4000 mg | INTRAMUSCULAR | Status: DC | PRN

## 2012-08-29 MED ORDER — ONDANSETRON HCL 4 MG/2ML IJ SOLN
INTRAMUSCULAR | Status: AC
  Filled 2012-08-29: qty 2

## 2012-08-29 MED ORDER — PROMETHAZINE HCL 25 MG/ML IJ SOLN: 6.2500 mg | INTRAMUSCULAR | Status: DC | PRN

## 2012-08-29 MED ORDER — KETOROLAC TROMETHAMINE 60 MG/2ML IM SOLN
60.0000 mg | Freq: Once | INTRAMUSCULAR | Status: AC | PRN
  Administered 2012-08-29: 60 mg via INTRAMUSCULAR

## 2012-08-29 MED ORDER — MEPERIDINE HCL 25 MG/ML IJ SOLN
6.2500 mg | INTRAMUSCULAR | Status: DC | PRN
  Administered 2012-08-29: 12.5 mg via INTRAVENOUS

## 2012-08-29 MED ORDER — OXYTOCIN 10 UNIT/ML IJ SOLN
INTRAMUSCULAR | Status: AC
  Filled 2012-08-29: qty 4

## 2012-08-29 MED ORDER — LABETALOL HCL 200 MG PO TABS
400.0000 mg | ORAL_TABLET | Freq: Three times a day (TID) | ORAL | Status: DC
  Filled 2012-08-29 (×2): qty 2

## 2012-08-29 MED ORDER — TETANUS-DIPHTH-ACELL PERTUSSIS 5-2.5-18.5 LF-MCG/0.5 IM SUSP
0.5000 mL | Freq: Once | INTRAMUSCULAR | Status: AC
  Administered 2012-08-30: 0.5 mL via INTRAMUSCULAR
  Filled 2012-08-29: qty 0.5

## 2012-08-29 MED ORDER — SIMETHICONE 80 MG PO CHEW
80.0000 mg | CHEWABLE_TABLET | Freq: Three times a day (TID) | ORAL | Status: DC
  Administered 2012-08-29 – 2012-09-01 (×9): 80 mg via ORAL

## 2012-08-29 MED ORDER — MEPERIDINE HCL 25 MG/ML IJ SOLN
INTRAMUSCULAR | Status: AC
  Administered 2012-08-29: 12.5 mg via INTRAVENOUS
  Filled 2012-08-29: qty 1

## 2012-08-29 SURGICAL SUPPLY — 31 items
ADH SKN CLS LQ APL DERMABOND (GAUZE/BANDAGES/DRESSINGS) ×1
CLAMP CORD UMBIL (MISCELLANEOUS) IMPLANT
CLOTH BEACON ORANGE TIMEOUT ST (SAFETY) ×2 IMPLANT
CONTAINER PREFILL 10% NBF 15ML (MISCELLANEOUS) IMPLANT
DERMABOND ADHESIVE PROPEN (GAUZE/BANDAGES/DRESSINGS) ×1
DERMABOND ADVANCED .7 DNX6 (GAUZE/BANDAGES/DRESSINGS) ×1 IMPLANT
DRAPE LG THREE QUARTER DISP (DRAPES) ×2 IMPLANT
DRSG OPSITE POSTOP 4X10 (GAUZE/BANDAGES/DRESSINGS) ×2 IMPLANT
DURAPREP 26ML APPLICATOR (WOUND CARE) ×2 IMPLANT
ELECT REM PT RETURN 9FT ADLT (ELECTROSURGICAL) ×2
ELECTRODE REM PT RTRN 9FT ADLT (ELECTROSURGICAL) ×1 IMPLANT
EXTRACTOR VACUUM M CUP 4 TUBE (SUCTIONS) IMPLANT
GLOVE BIO SURGEON STRL SZ7.5 (GLOVE) ×2 IMPLANT
GOWN PREVENTION PLUS XLARGE (GOWN DISPOSABLE) ×2 IMPLANT
GOWN STRL REIN XL XLG (GOWN DISPOSABLE) ×4 IMPLANT
KIT ABG SYR 3ML LUER SLIP (SYRINGE) IMPLANT
NEEDLE HYPO 25X1 1.5 SAFETY (NEEDLE) ×2 IMPLANT
NEEDLE HYPO 25X5/8 SAFETYGLIDE (NEEDLE) IMPLANT
NS IRRIG 1000ML POUR BTL (IV SOLUTION) ×2 IMPLANT
PACK C SECTION WH (CUSTOM PROCEDURE TRAY) ×2 IMPLANT
STAPLER VISISTAT 35W (STAPLE) ×2 IMPLANT
SUT MNCRL 0 VIOLET CTX 36 (SUTURE) ×2 IMPLANT
SUT MON AB 2-0 CT1 27 (SUTURE) ×2 IMPLANT
SUT MON AB-0 CT1 36 (SUTURE) ×4 IMPLANT
SUT MONOCRYL 0 CTX 36 (SUTURE) ×2
SUT PLAIN 0 NONE (SUTURE) IMPLANT
SUT PLAIN 2 0 XLH (SUTURE) IMPLANT
SYR CONTROL 10ML LL (SYRINGE) ×2 IMPLANT
TOWEL OR 17X24 6PK STRL BLUE (TOWEL DISPOSABLE) ×2 IMPLANT
TRAY FOLEY CATH 14FR (SET/KITS/TRAYS/PACK) ×2 IMPLANT
WATER STERILE IRR 1000ML POUR (IV SOLUTION) ×2 IMPLANT

## 2012-08-29 NOTE — Anesthesia Preprocedure Evaluation (Signed)
Anesthesia Evaluation  Patient identified by MRN, date of birth, ID band Patient awake    Reviewed: Allergy & Precautions, H&P , NPO status , Patient's Chart, lab work & pertinent test results, reviewed documented beta blocker date and time   Airway Mallampati: II TM Distance: >3 FB Neck ROM: full    Dental no notable dental hx.    Pulmonary neg pulmonary ROS,    Pulmonary exam normal       Cardiovascular hypertension, Pt. on home beta blockers and Pt. on medications     Neuro/Psych    GI/Hepatic negative GI ROS, Neg liver ROS,   Endo/Other  negative endocrine ROS  Renal/GU negative Renal ROS     Musculoskeletal   Abdominal Normal abdominal exam  (+)   Peds  Hematology negative hematology ROS (+)   Anesthesia Other Findings   Reproductive/Obstetrics (+) Pregnancy                           Anesthesia Physical Anesthesia Plan  ASA: III  Anesthesia Plan: Spinal   Post-op Pain Management:    Induction:   Airway Management Planned:   Additional Equipment:   Intra-op Plan:   Post-operative Plan:   Informed Consent: I have reviewed the patients History and Physical, chart, labs and discussed the procedure including the risks, benefits and alternatives for the proposed anesthesia with the patient or authorized representative who has indicated his/her understanding and acceptance.     Plan Discussed with: CRNA and Surgeon  Anesthesia Plan Comments:         Anesthesia Quick Evaluation

## 2012-08-29 NOTE — Anesthesia Postprocedure Evaluation (Signed)
Anesthesia Post Note  Patient: Kristie Nelson  Procedure(s) Performed: Procedure(s) (LRB): Repeat CESAREAN SECTION (N/A)  Anesthesia type: Spinal  Patient location: PACU  Post pain: Pain level controlled  Post assessment: Post-op Vital signs reviewed  Last Vitals:  Filed Vitals:   08/29/12 1324  BP: 179/102  Pulse: 95  Temp: 37.1 C  Resp: 16    Post vital signs: Reviewed  Level of consciousness: awake  Complications: No apparent anesthesia complications

## 2012-08-29 NOTE — Op Note (Signed)
Cesarean Section Procedure Note  Indications: previous uterine incision Kerr x one Labile CHTN AIUGR  Pre-operative Diagnosis: 37 week 2 day pregnancy.  Post-operative Diagnosis: same  Surgeon: Lenoard Aden   Assistants: Fredric Mare, CNM  Anesthesia: Local anesthesia 0.25.% bupivacaine and Spinal anesthesia  ASA Class: 2  Procedure Details  The patient was seen in the Holding Room. The risks, benefits, complications, treatment options, and expected outcomes were discussed with the patient.  The patient concurred with the proposed plan, giving informed consent. The risks of anesthesia, infection, bleeding and possible injury to other organs discussed. Injury to bowel, bladder, or ureter with possible need for repair discussed. Possible need for transfusion with secondary risks of hepatitis or HIV acquisition discussed. Post operative complications to include but not limited to DVT, PE and Pneumonia noted. The site of surgery properly noted/marked. The patient was taken to Operating Room # 9, identified as Kristie Nelson and the procedure verified as C-Section Delivery. A Time Out was held and the above information confirmed.  After induction of anesthesia, the patient was draped and prepped in the usual sterile manner. A Pfannenstiel incision was made and carried down through the subcutaneous tissue to the fascia. Fascial incision was made and extended transversely using Mayo scissors. The fascia was separated from the underlying rectus tissue superiorly and inferiorly. The peritoneum was identified and entered. Peritoneal incision was extended longitudinally. The utero-vesical peritoneal reflection was incised transversely and the bladder flap was bluntly freed from the lower uterine segment. A low transverse uterine incision(Kerr hysterotomy) was made. Delivered from vertex  presentation was a  female with Apgar scores of 5 at one minute and 8 at five minutes. Bulb suctioning gently performed.  Neonatal team in attendance.After the umbilical cord was clamped and cut cord blood was obtained for evaluation. The placenta was removed intact and appeared normal. The uterus was curetted with a dry lap pack. Good hemostasis was noted.The uterine outline, tubes and ovaries appeared normal. The uterine incision was closed with running locked sutures of 0 Monocryl x 2 layers. Hemostasis was observed.  The fascia was then reapproximated with running sutures of 0 Monocryl. The skin was reapproximated with 4-0 Monocryl.  Instrument, sponge, and needle counts were correct prior the abdominal closure and at the conclusion of the case.   Findings: Nl uterus, nl tubes, nl ovaries  Estimated Blood Loss:  300 mL         Drains: foley                 Specimens: placenta                  Complications:  None; patient tolerated the procedure well.         Disposition: PACU - hemodynamically stable.         Condition: stable  Attending Attestation: I performed the procedure.

## 2012-08-29 NOTE — Anesthesia Procedure Notes (Signed)
Spinal  Patient location during procedure: OR Start time: 08/29/2012 2:43 PM End time: 08/29/2012 2:46 PM Staffing Anesthesiologist: Sandrea Hughs Performed by: anesthesiologist  Preanesthetic Checklist Completed: patient identified, surgical consent, pre-op evaluation, timeout performed, IV checked, risks and benefits discussed and monitors and equipment checked Spinal Block Patient position: sitting Prep: DuraPrep Patient monitoring: heart rate, cardiac monitor, continuous pulse ox and blood pressure Approach: midline Location: L3-4 Injection technique: single-shot Needle Needle type: Sprotte  Needle gauge: 24 G Needle length: 9 cm Needle insertion depth: 7 cm Assessment Sensory level: T4

## 2012-08-29 NOTE — Transfer of Care (Signed)
Immediate Anesthesia Transfer of Care Note  Patient: Kristie Nelson  Procedure(s) Performed: Procedure(s) with comments: Repeat CESAREAN SECTION (N/A) - EDD: 09/17/12;REQUEST DEE;[name]  Patient Location: PACU  Anesthesia Type:Spinal  Level of Consciousness: awake, alert  and oriented  Airway & Oxygen Therapy: Patient Spontanous Breathing  Post-op Assessment: Report given to PACU RN and Post -op Vital signs reviewed and stable  Post vital signs: stable  Complications: No apparent anesthesia complications

## 2012-08-29 NOTE — Progress Notes (Signed)
Patient ID: Kristie Nelson, female   DOB: Jun 03, 1980, 32 y.o.   MRN: [id number] Patient seen and examined. Ht 5\' 3"  (1.6 m)  Wt 74.844 kg (165 lb)  BMI 29.24 kg/m2  Consent witnessed and signed. No changes noted. Update completed. CBC    Component Value Date/Time   WBC 8.2 08/27/2012 1455   RBC 3.47* 08/27/2012 1455   HGB 10.4* 08/27/2012 1455   HCT 30.5* 08/27/2012 1455   PLT 222 08/27/2012 1455   MCV 87.9 08/27/2012 1455   MCH 30.0 08/27/2012 1455   MCHC 34.1 08/27/2012 1455   RDW 13.2 08/27/2012 1455   LYMPHSABS 1.4 01/01/2010 1004   MONOABS 0.3 01/01/2010 1004   EOSABS 0.1 01/01/2010 1004   BASOSABS 0.0 01/01/2010 1004

## 2012-08-30 ENCOUNTER — Encounter (HOSPITAL_COMMUNITY): Payer: Self-pay | Admitting: Obstetrics and Gynecology

## 2012-08-30 DIAGNOSIS — D62 Acute posthemorrhagic anemia: Secondary | ICD-10-CM

## 2012-08-30 HISTORY — DX: Acute posthemorrhagic anemia: D62

## 2012-08-30 LAB — COMPREHENSIVE METABOLIC PANEL
AST: 22 U/L (ref 0–37)
Albumin: 2.6 g/dL — ABNORMAL LOW (ref 3.5–5.2)
Alkaline Phosphatase: 86 U/L (ref 39–117)
BUN: 6 mg/dL (ref 6–23)
Potassium: 3.9 mEq/L (ref 3.5–5.1)
Sodium: 133 mEq/L — ABNORMAL LOW (ref 135–145)
Total Protein: 5.7 g/dL — ABNORMAL LOW (ref 6.0–8.3)

## 2012-08-30 LAB — CBC
MCV: 88.3 fL (ref 78.0–100.0)
Platelets: 207 10*3/uL (ref 150–400)
RDW: 13.1 % (ref 11.5–15.5)
WBC: 11.3 10*3/uL — ABNORMAL HIGH (ref 4.0–10.5)

## 2012-08-30 NOTE — Progress Notes (Signed)
UR chart review completed.  

## 2012-08-30 NOTE — Progress Notes (Signed)
Patient ID: Kristie Nelson, female   DOB: 10-11-1980, 32 y.o.   MRN: [id number] Subjective: POD# 1 Information for the patient's newborn:  Kristie Nelson, Kristie Nelson Kristie Nelson [[id number]]  female  Reports feeling sore, but doing well Feeding: breast Patient reports tolerating PO.  Breast symptoms: none Pain controlled with ibuprofen (OTC) and narcotic analgesics including Percocet Denies HA/SOB/C/P/N/V/dizziness. No flatus.  She reports belching. She reports vaginal bleeding as normal, without clots.  She is ambulating, urinating without difficult.     Objective:   VS:  Filed Vitals:   08/30/12 0600 08/30/12 0700 08/30/12 0740 08/30/12 0930  BP: 134/83 151/90  157/88  Pulse:    93  Temp:   98.2 F (36.8 C)   TempSrc:   Oral   Resp:   16 16  Height:      Weight:      SpO2:    100%     Intake/Output Summary (Last 24 hours) at 08/30/12 1124 Last data filed at 08/30/12 0956  Gross per 24 hour  Intake 4191.88 ml  Output   3100 ml  Net 1091.88 ml        Recent Labs  08/27/12 1455 08/30/12 0452  WBC 8.2 11.3*  HGB 10.4* 8.4*  HCT 30.5* 24.8*  PLT 222 207     Blood type: --/--/A POS, A POS (08/25 1455)  Rubella: Immune (02/18 0816)     Physical Exam:  General: alert, cooperative and no distress CV: Regular rate and rhythm, S1S2 present or without murmur or extra heart sounds Resp: clear Abdomen: soft, nontender, normal bowel sounds Incision: Tegaderm and Honeycomb dressing intact Uterine Fundus: firm, below umbilicus, nontender Lochia: minimal Ext: extremities normal, atraumatic, no cyanosis or edema, Homans sign is negative, no sign of DVT and no edema, redness or tenderness in the calves or thighs      Assessment/Plan: 32 y.o.   POD# 1.  s/p Cesarean Delivery.  Indications: CHTN and AIUGR                Active Problems:   Postpartum care following cesarean delivery (8/27)   Postoperative anemia due to acute blood loss  Doing well, stable.               Regular diet as  tolerated Ambulate Transfer care from AICU to MB Routine post-op care  Kenard Gower, MSN, CNM 08/30/2012, 11:24 AM

## 2012-08-30 NOTE — Progress Notes (Signed)
Pt transferred to [facility] Rm # [id number] via w/c.

## 2012-08-31 MED ORDER — HYDROCHLOROTHIAZIDE 25 MG PO TABS
25.0000 mg | ORAL_TABLET | Freq: Every day | ORAL | Status: DC
  Administered 2012-09-01: 25 mg via ORAL
  Filled 2012-08-31: qty 1

## 2012-08-31 MED ORDER — HYDROCHLOROTHIAZIDE 12.5 MG PO CAPS
12.5000 mg | ORAL_CAPSULE | Freq: Every day | ORAL | Status: DC
  Administered 2012-08-31: 12.5 mg via ORAL
  Filled 2012-08-31 (×2): qty 1

## 2012-08-31 MED ORDER — POLYSACCHARIDE IRON COMPLEX 150 MG PO CAPS
150.0000 mg | ORAL_CAPSULE | Freq: Two times a day (BID) | ORAL | Status: DC
  Administered 2012-08-31 – 2012-09-01 (×3): 150 mg via ORAL
  Filled 2012-08-31 (×3): qty 1

## 2012-08-31 MED ORDER — HYDROCHLOROTHIAZIDE 12.5 MG PO CAPS
12.5000 mg | ORAL_CAPSULE | Freq: Once | ORAL | Status: AC
  Administered 2012-08-31: 12.5 mg via ORAL
  Filled 2012-08-31: qty 1

## 2012-08-31 NOTE — Progress Notes (Signed)
POD # 2  Subjective: Pt reports feeling good, a little sore/ Pain controlled with Motrin and Percocet Tolerating po/Voiding without problems/ No n/v/Flatus present No HA, visual changes, or epigastric pain No dizziness, SOB, or chest pain Activity: ad lib Bleeding is light Newborn info:  Information for the patient's newborn:  Jesselle, Laflamme [[id number]]  female Feeding: bottle   Objective: VS: Temp:  [97.9 F (36.6 C)-98.4 F (36.9 C)] 98.4 F (36.9 C) (08/29 0612) Pulse Rate:  [87-98] 88 (08/29 0909) Resp:  [16-19] 19 (08/29 0612) BP: (120-168)/(77-89) 154/77 mmHg (08/29 0909) SpO2:  [98 %-100 %] 99 % (08/29 0612)  I&O: Intake/Output     08/28 0701 - 08/29 0700 08/29 0701 - 08/30 0700   P.O. 380    I.V. (mL/kg)     Total Intake(mL/kg) 380 (5.4)    Urine (mL/kg/hr) 650 (0.4)    Blood     Total Output 650     Net -270            LABS:  Recent Labs  08/30/12 0452  WBC 11.3*  HGB 8.4*  HCT 24.8*  PLT 207    Blood type: --/--/A POS, A POS (08/25 1455) Rubella: Immune (02/18 0816)     Physical Exam:  General: alert and cooperative CV: Regular rate and rhythm Resp: clear Abdomen: soft, nontender, normal bowel sounds Uterine Fundus: firm, below umbilicus, nontender Incision: Covered with Tegaderm and honeycomb dressing; well approximated, clean and dry. Lochia: minimal Ext: extremities normal, atraumatic, no cyanosis or edema and Homans sign is negative, no sign of DVT    Assessment/: POD # 2/ G3P1012/ S/P C/Section d/t repeat, IUGR Chronic Hypertension-labile BP's ABL anemia Doing well  Plan: Continue routine post op orders Continue Labetalol, add HCTZ Start Niferex Anticipate discharge home in the am   Signed: Lawernce Pitts, MSN, CNM 08/31/2012, 9:14 AM

## 2012-09-01 MED ORDER — OXYCODONE-ACETAMINOPHEN 5-325 MG PO TABS: 1.0000 | ORAL_TABLET | ORAL | Status: DC | PRN

## 2012-09-01 MED ORDER — POLYSACCHARIDE IRON COMPLEX 150 MG PO CAPS: 150.0000 mg | ORAL_CAPSULE | Freq: Two times a day (BID) | ORAL | Status: DC

## 2012-09-01 MED ORDER — IBUPROFEN 600 MG PO TABS: 600.0000 mg | ORAL_TABLET | Freq: Four times a day (QID) | ORAL | Status: DC

## 2012-09-01 MED ORDER — LABETALOL HCL 200 MG PO TABS: 600.0000 mg | ORAL_TABLET | Freq: Three times a day (TID) | ORAL | Status: DC

## 2012-09-01 MED ORDER — HYDROCHLOROTHIAZIDE 12.5 MG PO CAPS: 12.5000 mg | ORAL_CAPSULE | Freq: Every day | ORAL | Status: DC

## 2012-09-01 NOTE — Progress Notes (Signed)
POD # 3  Subjective: Pt reports feeling good/ Pain controlled with Motrin and Percocet Tolerating po/Voiding without problems/ No n/v/Flatus present No HA, visual changes, or epigastric pain No dizziness, SOB, or chest pain Activity: ad lib Bleeding is light Newborn info:  Information for the patient's newborn:  Doneisha, Ivey [[id number]]  female  Feeding: bottle   Objective: VS: BP 148/72  Pulse 97  Temp(Src) 97.3 F (36.3 C) (Oral)  Resp 18  Ht 5\' 3"  (1.6 m)  Wt 70.761 kg (156 lb)  BMI 27.64 kg/m2  SpO2 100%  I&O: Intake/Output     08/29 0701 - 08/30 0700 08/30 0701 - 08/31 0700   P.O. 600    Total Intake(mL/kg) 600 (8.5)    Urine (mL/kg/hr) 750 (0.4)    Total Output 750     Net -150          Stool Occurrence 1 x      LABS:  Recent Labs  08/30/12 0452  WBC 11.3*  HGB 8.4*  PLT 207                           Physical Exam:  General: alert and cooperative CV: Regular rate and rhythm Resp: clear Abdomen: soft, nontender, normal bowel sounds Incision: healing well, no drainage, no erythema, no hernia, no seroma, no swelling, well approximated Uterine Fundus: firm, below umbilicus, nontender Lochia: minimal Ext: extremities normal, atraumatic, no cyanosis or edema and Homans sign is negative, no sign of DVT    Assessment: POD # 3/ G3P1012/ S/P C/Section d/t repeat, labile CHTN, IUGR IDA with compounding ABL anemia CHTN Doing well and stable for discharge home  Plan: Discharge home RX's: Ibuprofen 600mg  po Q 6 hrs prn pain #30 Refill x 1 Percocet 5/325 1 - 2 tabs po every 6 hrs prn pain  #30 No refill Niferex 150mg  po BID #60 Refill x 1 HCTZ 12.5 mg po daily x 1 week   Signed: Donette Larry, N, MSN, CNM 09/01/2012, 7:21 AM

## 2012-09-01 NOTE — Discharge Summary (Signed)
POST OPERATIVE DISCHARGE SUMMARY:  Patient ID: Kristie Nelson MRN: [id number] DOB/AGE: 34/34/1982 34 y.o.  Admit date: 08/29/2012 Admission Diagnoses: 37.[redacted] week gestation, Odessa Fleming   Discharge date: 09/01/12    Discharge Diagnoses: S/p cesarean section, CHTN, ABL anemia  Prenatal history: [id number]   EDC : 09/17/2012, by Other Basis  Prenatal care at [facility]  Primary provider : Dr. Billy Coast Prenatal course complicated by Odessa Fleming  Prenatal Labs: ABO, Rh: A (02/18 0816)  Antibody: NEG (08/25 1455) Rubella: Immune (02/18 0816)  RPR: NON REACTIVE (08/25 1455)  HBsAg: Negative (02/18 0816)  HIV: Non-reactive (02/18 0816)  GBS:   Neg GTT: WNL  Medical / Surgical History :  Past medical history:  Past Medical History  Diagnosis Date  . ECZEMA 03/19/2009  . HYPERLIPIDEMIA 12/09/2009  . ANXIETY 04/03/2007  . DEPRESSION 04/03/2007  . MIGRAINE, COMMON 12/09/2009  . HYPERTENSION 04/03/2007  . ALLERGIC RHINITIS 04/03/2007  . Dysrhythmia     palpitations due to stress  . Shortness of breath     with anxiety  . Heartburn in pregnancy   . Postoperative anemia due to acute blood loss 08/30/2012    Past surgical history:  Past Surgical History  Procedure Laterality Date  . Cesarean section  2010  . Cesarean section N/A 08/29/2012    Procedure: Repeat CESAREAN SECTION;  Surgeon: Lenoard Aden, MD;  Location: [facility];  Service: Obstetrics;  Laterality: N/A;  EDD: 09/17/12;REQUEST DEE;[name]    Family History:  Family History  Problem Relation Age of Onset  . Diabetes Mother   . Hypertension Other   . Thyroid disease Other     hypothyroidism    Social History:  reports that she has never smoked. She has never used smokeless tobacco. She reports that she does not drink alcohol or use illicit drugs.   Allergies: Latex; Peanut-containing drug products; and Penicillins    Current Medications at time of admission:  Prior to Admission medications    Medication Sig Start Date End Date Taking? Authorizing Provider  Prenatal Vit-Fe Fumarate-FA (PRENATAL MULTIVITAMIN) TABS tablet Take 1 tablet by mouth daily at 12 noon.   Yes Historical Provider, MD  fexofenadine (ALLEGRA) 180 MG tablet Take 1 tablet (180 mg total) by mouth daily. 01/12/11 01/12/12  Corwin Levins, MD  hydrochlorothiazide (MICROZIDE) 12.5 MG capsule Take 1 capsule (12.5 mg total) by mouth daily. 09/01/12   Lawernce Pitts, CNM  ibuprofen (ADVIL,MOTRIN) 600 MG tablet Take 1 tablet (600 mg total) by mouth every 6 (six) hours. 09/01/12   Lawernce Pitts, CNM  iron polysaccharides (NIFEREX) 150 MG capsule Take 1 capsule (150 mg total) by mouth 2 (two) times daily. 09/01/12   Lawernce Pitts, CNM  labetalol (NORMODYNE) 200 MG tablet Take 3 tablets (600 mg total) by mouth 3 (three) times daily. 09/01/12   Lawernce Pitts, CNM  mometasone (NASONEX) 50 MCG/ACT nasal spray Place 2 sprays into the nose daily. 04/18/11 04/17/12  Etta Grandchild, MD  oxyCODONE-acetaminophen (PERCOCET/ROXICET) 5-325 MG per tablet Take 1-2 tablets by mouth every 4 (four) hours as needed. 09/01/12   Lawernce Pitts, CNM     Intrapartum Course: scheduled repeat cesarean section   Procedures: Cesarean section delivery of female newborn by Dr Billy Coast on 08/29/12 See operative report for further details APGAR (1 MIN): 5   APGAR (5 MINS): 7     Postoperative / postpartum course: complicated by ABL anemia, labile hypertension  Physical Exam:  VSS: Temp:  [97.3 F (36.3 C)-98.9 F (37.2 C)] 97.3 F (36.3 C) (08/30 0627) Pulse Rate:  [88-105] 97 (08/30 0627) Resp:  [18-20] 18 (08/30 0627) BP: (138-185)/(71-101) 148/72 mmHg (08/30 0627) SpO2:  [99 %-100 %] 100 % (08/30 0627)  LABS:  Recent Labs  08/30/12 0452  WBC 11.3*  HGB 8.4*  PLT 207    General: alert and oriented x3 Heart: RRR Lungs: clear  Abdomen: soft and non-tender / non-distended / active BS  Extremities: no edema / negative  Homans  Dressing: clean, dry, and intact honeycomb dressing Incision:  approximated with subcuticular / no erythema / no ecchymosis / no drainage  Discharge Instructions:  Discharged Condition: good Activity: pelvic rest and postoperative restrictions x 2 weeks Diet: routine Medications: see below   Medication List    STOP taking these medications       amLODipine 5 MG tablet  Commonly known as:  NORVASC     ZANTAC 150 MG tablet  Generic drug:  ranitidine      TAKE these medications       fexofenadine 180 MG tablet  Commonly known as:  ALLEGRA  Take 1 tablet (180 mg total) by mouth daily.     hydrochlorothiazide 12.5 MG capsule  Commonly known as:  MICROZIDE  Take 1 capsule (12.5 mg total) by mouth daily.     ibuprofen 600 MG tablet  Commonly known as:  ADVIL,MOTRIN  Take 1 tablet (600 mg total) by mouth every 6 (six) hours.     iron polysaccharides 150 MG capsule  Commonly known as:  NIFEREX  Take 1 capsule (150 mg total) by mouth 2 (two) times daily.     labetalol 200 MG tablet  Commonly known as:  NORMODYNE  Take 3 tablets (600 mg total) by mouth 3 (three) times daily.     mometasone 50 MCG/ACT nasal spray  Commonly known as:  NASONEX  Place 2 sprays into the nose daily.     oxyCODONE-acetaminophen 5-325 MG per tablet  Commonly known as:  PERCOCET/ROXICET  Take 1-2 tablets by mouth every 4 (four) hours as needed.     prenatal multivitamin Tabs tablet  Take 1 tablet by mouth daily at 12 noon.       Wound Care: Keep dressing clean and dry, remove in 1-2 days Clean incision with warm water and dry thoroughly Call the office for redness, swelling, or drainage from incision  Postpartum Instructions: [facility] discharge booklet - instructions reviewed Discharge to: Home  Follow up : [facility] in 6 weeks for routine postpartum visit    [facility] in 1 week for BP check                      Signed: Donette Larry,  N MSN, CNM 09/01/2012, 7:34 AM

## 2012-10-24 ENCOUNTER — Ambulatory Visit (INDEPENDENT_AMBULATORY_CARE_PROVIDER_SITE_OTHER): Payer: BC Managed Care – PPO | Admitting: Internal Medicine

## 2012-10-24 ENCOUNTER — Encounter: Payer: Self-pay | Admitting: Internal Medicine

## 2012-10-24 VITALS — BP 182/100 | HR 76 | Temp 98.3°F | Ht 63.0 in | Wt 140.5 lb

## 2012-10-24 DIAGNOSIS — I10 Essential (primary) hypertension: Secondary | ICD-10-CM

## 2012-10-24 DIAGNOSIS — F411 Generalized anxiety disorder: Secondary | ICD-10-CM

## 2012-10-24 DIAGNOSIS — F329 Major depressive disorder, single episode, unspecified: Secondary | ICD-10-CM

## 2012-10-24 MED ORDER — METOPROLOL SUCCINATE ER 50 MG PO TB24: 50.0000 mg | ORAL_TABLET | Freq: Every day | ORAL | Status: DC

## 2012-10-24 MED ORDER — HYDROCHLOROTHIAZIDE 25 MG PO TABS: 25.0000 mg | ORAL_TABLET | Freq: Every day | ORAL | Status: DC

## 2012-10-24 MED ORDER — AMLODIPINE BESYLATE 10 MG PO TABS: 10.0000 mg | ORAL_TABLET | Freq: Every day | ORAL | Status: DC

## 2012-10-24 NOTE — Assessment & Plan Note (Signed)
stable overall by history and exam, recent data reviewed with pt, and pt to continue medical treatment as before,  to f/u any worsening symptoms or concerns Lab Results  Component Value Date   WBC 11.3* 08/30/2012   HGB 8.4* 08/30/2012   HCT 24.8* 08/30/2012   PLT 207 08/30/2012   GLUCOSE 101* 08/30/2012   CHOL 196 01/01/2010   TRIG 46.0 01/01/2010   HDL 49.10 01/01/2010   LDLDIRECT 109.3 10/17/2007   LDLCALC 138* 01/01/2010   ALT 7 08/30/2012   AST 22 08/30/2012   NA 133* 08/30/2012   K 3.9 08/30/2012   CL 100 08/30/2012   CREATININE 0.57 08/30/2012   BUN 6 08/30/2012   CO2 22 08/30/2012   TSH 0.96 01/01/2010

## 2012-10-24 NOTE — Progress Notes (Signed)
Subjective:    Patient ID: Kristie Nelson, female    DOB: December 26, 1980, 32 y.o.   MRN: [id number]  HPI  Here to f/u, unfortunately having had severe HTN ongoing for many months, including pregnancy now 2 mo post partum. Not taking the labetolol as is convinced is causing GI upset even with taking with food.  Pt denies chest pain, increased sob or doe, wheezing, orthopnea, PND, increased LE swelling, palpitations, dizziness or syncope.   Pt denies polydipsia, polyuria. Pt denies new neurological symptoms such as new headache, or facial or extremity weakness or numbness. Denies worsening depressive symptoms, suicidal ideation, or panic.  Not using birth control for now but not recently sexually active and states husband to get vasectomy, she plans no more children Past Medical History  Diagnosis Date  . ECZEMA 03/19/2009  . HYPERLIPIDEMIA 12/09/2009  . ANXIETY 04/03/2007  . DEPRESSION 04/03/2007  . MIGRAINE, COMMON 12/09/2009  . HYPERTENSION 04/03/2007  . ALLERGIC RHINITIS 04/03/2007  . Dysrhythmia     palpitations due to stress  . Shortness of breath     with anxiety  . Heartburn in pregnancy   . Postoperative anemia due to acute blood loss 08/30/2012   Past Surgical History  Procedure Laterality Date  . Cesarean section  2010  . Cesarean section N/A 08/29/2012    Procedure: Repeat CESAREAN SECTION;  Surgeon: Lenoard Aden, MD;  Location: [facility];  Service: Obstetrics;  Laterality: N/A;  EDD: 09/17/12;REQUEST DEE;[name]    reports that she has never smoked. She has never used smokeless tobacco. She reports that she does not drink alcohol or use illicit drugs. family history includes Diabetes in her mother; Hypertension in her other; Thyroid disease in her other. Allergies  Allergen Reactions  . Latex Itching    swelling  . Peanut-Containing Drug Products     REACTION: throat swelling  . Penicillins     REACTION: itching   Current Outpatient Prescriptions on File Prior to Visit   Medication Sig Dispense Refill  . iron polysaccharides (NIFEREX) 150 MG capsule Take 1 capsule (150 mg total) by mouth 2 (two) times daily.  60 capsule  1  . Prenatal Vit-Fe Fumarate-FA (PRENATAL MULTIVITAMIN) TABS tablet Take 1 tablet by mouth daily at 12 noon.      . fexofenadine (ALLEGRA) 180 MG tablet Take 1 tablet (180 mg total) by mouth daily.  30 tablet  11  . mometasone (NASONEX) 50 MCG/ACT nasal spray Place 2 sprays into the nose daily.  17 g  12   No current facility-administered medications on file prior to visit.   Review of Systems  Constitutional: Negative for unexpected weight change, or unusual diaphoresis  HENT: Negative for tinnitus.   Eyes: Negative for photophobia and visual disturbance.  Respiratory: Negative for choking and stridor.   Gastrointestinal: Negative for vomiting and blood in stool.  Genitourinary: Negative for hematuria and decreased urine volume.  Musculoskeletal: Negative for acute joint swelling Skin: Negative for color change and wound.  Neurological: Negative for tremors and numbness other than noted  Psychiatric/Behavioral: Negative for decreased concentration or  hyperactivity.       Objective:   Physical Exam BP 182/100  Pulse 76  Temp(Src) 98.3 F (36.8 C) (Oral)  Ht 5\' 3"  (1.6 m)  Wt 140 lb 8 oz (63.73 kg)  BMI 24.89 kg/m2  SpO2 97% VS noted,  Constitutional: Pt appears well-developed and well-nourished.  HENT: Head: NCAT.  Right Ear: External ear normal.  Left Ear: External ear  normal.  Eyes: Conjunctivae and EOM are normal. Pupils are equal, round, and reactive to light.  Neck: Normal range of motion. Neck supple.  Cardiovascular: Normal rate and regular rhythm.   Pulmonary/Chest: Effort normal and breath sounds normal.  Abd:  Soft, NT, non-distended, + BS Neurological: Pt is alert. Not confused  Skin: Skin is warm. No erythema.  Psychiatric: Pt behavior is normal. Thought content normal. not depressed affect, mild nervous     Assessment & Plan:

## 2012-10-24 NOTE — Assessment & Plan Note (Signed)
Severe uncontrolled, to change meds to hct 25, qd, toprol xl 50 qd, and amlod 10 qd,  to f/u any worsening symptoms or concerns BP Readings from Last 3 Encounters:  10/24/12 182/100  09/01/12 171/94  09/01/12 171/94

## 2012-10-24 NOTE — Patient Instructions (Addendum)
Please take all new medication as prescribed - the amlodipine 10 mg, and the toprol XL 50 mg per day (both are generic) Please continue all other medications as before, and refills have been done if requested - the fluid pill  Please continue to monitor your  Blood pressure at home on a regular basis, with the goal being less than 140/90  Please return in 3 weeks, or sooner if needed (the office will call)

## 2012-10-24 NOTE — Assessment & Plan Note (Signed)
Mild, declines need for counseling or other tx now

## 2012-11-09 ENCOUNTER — Telehealth: Payer: Self-pay | Admitting: *Deleted

## 2012-11-09 NOTE — Telephone Encounter (Signed)
Pt called requesting whether she can take Lexapro along with current BP medications.  Please advise

## 2012-11-09 NOTE — Telephone Encounter (Signed)
Unable to contact pt, no VM. 

## 2012-11-09 NOTE — Telephone Encounter (Signed)
Yes, that should be fine.

## 2012-11-09 NOTE — Telephone Encounter (Signed)
Patient contacted and give MD instructions on medication.

## 2012-11-14 ENCOUNTER — Encounter: Payer: Self-pay | Admitting: Internal Medicine

## 2012-11-14 ENCOUNTER — Ambulatory Visit (INDEPENDENT_AMBULATORY_CARE_PROVIDER_SITE_OTHER): Payer: BC Managed Care – PPO | Admitting: Internal Medicine

## 2012-11-14 VITALS — BP 120/82 | HR 76 | Temp 97.8°F | Ht 62.0 in | Wt 136.2 lb

## 2012-11-14 DIAGNOSIS — I10 Essential (primary) hypertension: Secondary | ICD-10-CM

## 2012-11-14 DIAGNOSIS — Z0000 Encounter for general adult medical examination without abnormal findings: Secondary | ICD-10-CM

## 2012-11-14 DIAGNOSIS — Z23 Encounter for immunization: Secondary | ICD-10-CM

## 2012-11-14 NOTE — Patient Instructions (Signed)
You had the flu shot today Please continue all other medications as before, and refills have been done if requested. Please have the pharmacy call with any other refills you may need. Please check your blood pressure on a regular basis at home, your goal is to be < 140/90  Please return in 1 year for your yearly visit, or sooner if needed, with Lab testing done 3-5 days before

## 2012-11-14 NOTE — Assessment & Plan Note (Addendum)
Improved, now stable overall by history and exam, recent data reviewed with pt, and pt to continue medical treatment as before,  to f/u any worsening symptoms or concerns, f/u BP at home as well BP Readings from Last 3 Encounters:  11/14/12 120/82  10/24/12 182/100  09/01/12 171/94

## 2012-11-14 NOTE — Progress Notes (Signed)
Subjective:    Patient ID: Kristie Nelson, female    DOB: 01/22/1980, 32 y.o.   MRN: [id number]  HPI  Here to f/u BP; overall doing ok,  Pt denies chest pain, increased sob or doe, wheezing, orthopnea, PND, increased LE swelling, palpitations, dizziness or syncope.  Pt denies polydipsia, polyuria, or low sugar symptoms such as weakness or confusion improved with po intake.  Pt denies new neurological symptoms such as new headache, or facial or extremity weakness or numbness.   Pt states overall good compliance with meds, has been trying to follow lower cholesterol diet, with wt overall stable,  but little exercise however.  Tolerating meds well. No complaints Past Medical History  Diagnosis Date  . ECZEMA 03/19/2009  . HYPERLIPIDEMIA 12/09/2009  . ANXIETY 04/03/2007  . DEPRESSION 04/03/2007  . MIGRAINE, COMMON 12/09/2009  . HYPERTENSION 04/03/2007  . ALLERGIC RHINITIS 04/03/2007  . Dysrhythmia     palpitations due to stress  . Shortness of breath     with anxiety  . Heartburn in pregnancy   . Postoperative anemia due to acute blood loss 08/30/2012   Past Surgical History  Procedure Laterality Date  . Cesarean section  2010  . Cesarean section N/A 08/29/2012    Procedure: Repeat CESAREAN SECTION;  Surgeon: Lenoard Aden, MD;  Location: [facility];  Service: Obstetrics;  Laterality: N/A;  EDD: 09/17/12;REQUEST DEE;[name]    reports that she has never smoked. She has never used smokeless tobacco. She reports that she does not drink alcohol or use illicit drugs. family history includes Diabetes in her mother; Hypertension in her other; Thyroid disease in her other. Allergies  Allergen Reactions  . Latex Itching    swelling  . Peanut-Containing Drug Products     REACTION: throat swelling  . Penicillins     REACTION: itching   Current Outpatient Prescriptions on File Prior to Visit  Medication Sig Dispense Refill  . amLODipine (NORVASC) 10 MG tablet Take 1 tablet (10 mg total) by mouth daily.   90 tablet  3  . hydrochlorothiazide (HYDRODIURIL) 25 MG tablet Take 1 tablet (25 mg total) by mouth daily.  90 tablet  3  . iron polysaccharides (NIFEREX) 150 MG capsule Take 1 capsule (150 mg total) by mouth 2 (two) times daily.  60 capsule  1  . metoprolol succinate (TOPROL XL) 50 MG 24 hr tablet Take 1 tablet (50 mg total) by mouth daily. - generic please  90 tablet  3  . Prenatal Vit-Fe Fumarate-FA (PRENATAL MULTIVITAMIN) TABS tablet Take 1 tablet by mouth daily at 12 noon.      . fexofenadine (ALLEGRA) 180 MG tablet Take 1 tablet (180 mg total) by mouth daily.  30 tablet  11  . mometasone (NASONEX) 50 MCG/ACT nasal spray Place 2 sprays into the nose daily.  17 g  12   No current facility-administered medications on file prior to visit.   Review of Systems All otherwise neg per pt     Objective:   Physical Exam BP 120/82  Pulse 76  Temp(Src) 97.8 F (36.6 C) (Oral)  Ht 5\' 2"  (1.575 m)  Wt 136 lb 4 oz (61.803 kg)  BMI 24.91 kg/m2  SpO2 99% VS noted,  Constitutional: Pt appears well-developed and well-nourished.  HENT: Head: NCAT.  Right Ear: External ear normal.  Left Ear: External ear normal.  Eyes: Conjunctivae and EOM are normal. Pupils are equal, round, and reactive to light.  Neck: Normal range of motion. Neck supple.  Cardiovascular: Normal rate and regular rhythm.   Pulmonary/Chest: Effort normal and breath sounds normal.  Abd:  Soft, NT, non-distended, + BS Neurological: Pt is alert. Not confused  Skin: Skin is warm. No erythema.  Psychiatric: Pt behavior is normal. Thought content normal.     Assessment & Plan:

## 2012-12-19 ENCOUNTER — Other Ambulatory Visit: Payer: Self-pay | Admitting: Internal Medicine

## 2013-03-26 ENCOUNTER — Encounter: Payer: Self-pay | Admitting: Internal Medicine

## 2013-03-26 ENCOUNTER — Telehealth: Payer: Self-pay

## 2013-03-26 ENCOUNTER — Ambulatory Visit (INDEPENDENT_AMBULATORY_CARE_PROVIDER_SITE_OTHER): Payer: BC Managed Care – PPO | Admitting: Internal Medicine

## 2013-03-26 ENCOUNTER — Other Ambulatory Visit (INDEPENDENT_AMBULATORY_CARE_PROVIDER_SITE_OTHER): Payer: BC Managed Care – PPO

## 2013-03-26 VITALS — BP 122/80 | HR 78 | Temp 98.3°F | Ht 62.0 in | Wt 138.8 lb

## 2013-03-26 DIAGNOSIS — R109 Unspecified abdominal pain: Secondary | ICD-10-CM

## 2013-03-26 DIAGNOSIS — I10 Essential (primary) hypertension: Secondary | ICD-10-CM

## 2013-03-26 DIAGNOSIS — K5909 Other constipation: Secondary | ICD-10-CM

## 2013-03-26 DIAGNOSIS — E785 Hyperlipidemia, unspecified: Secondary | ICD-10-CM

## 2013-03-26 DIAGNOSIS — K5900 Constipation, unspecified: Secondary | ICD-10-CM

## 2013-03-26 LAB — CBC WITH DIFFERENTIAL/PLATELET
Basophils Absolute: 0 10*3/uL (ref 0.0–0.1)
Basophils Relative: 0.5 % (ref 0.0–3.0)
EOS PCT: 1.5 % (ref 0.0–5.0)
Eosinophils Absolute: 0.1 10*3/uL (ref 0.0–0.7)
HCT: 37.5 % (ref 36.0–46.0)
HEMOGLOBIN: 12.4 g/dL (ref 12.0–15.0)
Lymphocytes Relative: 29.2 % (ref 12.0–46.0)
Lymphs Abs: 1.8 10*3/uL (ref 0.7–4.0)
MCHC: 33 g/dL (ref 30.0–36.0)
MCV: 92.5 fl (ref 78.0–100.0)
Monocytes Absolute: 0.3 10*3/uL (ref 0.1–1.0)
Monocytes Relative: 4.4 % (ref 3.0–12.0)
NEUTROS PCT: 64.4 % (ref 43.0–77.0)
Neutro Abs: 4.1 10*3/uL (ref 1.4–7.7)
Platelets: 289 10*3/uL (ref 150.0–400.0)
RBC: 4.06 Mil/uL (ref 3.87–5.11)
RDW: 12.7 % (ref 11.5–14.6)
WBC: 6.3 10*3/uL (ref 4.5–10.5)

## 2013-03-26 LAB — BASIC METABOLIC PANEL
BUN: 12 mg/dL (ref 6–23)
CO2: 30 mEq/L (ref 19–32)
Calcium: 9.3 mg/dL (ref 8.4–10.5)
Chloride: 99 mEq/L (ref 96–112)
Creatinine, Ser: 0.7 mg/dL (ref 0.4–1.2)
GFR: 133.37 mL/min (ref >60.00)
Glucose, Bld: 72 mg/dL (ref 70–99)
POTASSIUM: 2.8 meq/L — AB (ref 3.5–5.1)
Sodium: 136 mEq/L (ref 135–145)

## 2013-03-26 LAB — HEPATIC FUNCTION PANEL
ALT: 14 U/L (ref 0–35)
AST: 20 U/L (ref 0–37)
Albumin: 4.5 g/dL (ref 3.5–5.2)
Alkaline Phosphatase: 46 U/L (ref 39–117)
BILIRUBIN TOTAL: 0.6 mg/dL (ref 0.3–1.2)
Bilirubin, Direct: 0.1 mg/dL (ref 0.0–0.3)
Total Protein: 7.7 g/dL (ref 6.0–8.3)

## 2013-03-26 LAB — LIPID PANEL
CHOLESTEROL: 199 mg/dL (ref 0–200)
HDL: 44.3 mg/dL (ref >39.00)
LDL Cholesterol: 138 mg/dL — ABNORMAL HIGH (ref 0–99)
Total CHOL/HDL Ratio: 4
Triglycerides: 82 mg/dL (ref 0.0–149.0)
VLDL: 16.4 mg/dL (ref 0.0–40.0)

## 2013-03-26 LAB — LIPASE: LIPASE: 19 U/L (ref 11.0–59.0)

## 2013-03-26 LAB — TSH: TSH: 0.77 u[IU]/mL (ref 0.35–5.50)

## 2013-03-26 MED ORDER — TRIAMCINOLONE ACETONIDE 0.1 % EX CREA: TOPICAL_CREAM | Freq: Two times a day (BID) | CUTANEOUS | Status: DC

## 2013-03-26 MED ORDER — POTASSIUM CHLORIDE ER 10 MEQ PO TBCR: 10.0000 meq | EXTENDED_RELEASE_TABLET | Freq: Two times a day (BID) | ORAL | Status: DC

## 2013-03-26 NOTE — Telephone Encounter (Signed)
Critical lab Potassium 2.8 

## 2013-03-26 NOTE — Telephone Encounter (Signed)
K is low, likely due to HCTZ fluid pill, and probably leading to her constipation as well  D/c HCTZ  klor con 10 bid for 5 days  Cont all other meds  Robin to inform pt, I will do rx  F/u OV 1 wk

## 2013-03-26 NOTE — Assessment & Plan Note (Signed)
Also for lipid f/u, not on statin at this time, not related to constipatoin

## 2013-03-26 NOTE — Assessment & Plan Note (Signed)
For miralx otc and dulcolox prn, also for Linzess if not effective in 7-10 days

## 2013-03-26 NOTE — Progress Notes (Signed)
Subjective:    Patient ID: Kristie Nelson, female    DOB: 12/27/1980, 33 y.o.   MRN: [id number]  HPI Here to f/u with c/o constipation with occas crampy ab pains; ongoing issue since her first child  July 2010, seems worse since second child aug 2015 with c-section complicated by ABL anemia, hgb 8.4.  No breast feeding.  Overall good compliance with treatment, and good medicine tolerability.Does not really check her BP but Pt denies chest pain, increased sob or doe, wheezing, orthopnea, PND, increased LE swelling, palpitations, dizziness or syncope.  Pt denies new neurological symptoms such as new headache, or facial or extremity weakness or numbness   Pt denies polydipsia, polyuria.  No real change recently with fluids, Denies worsening reflux, abd pain, dysphagia, vomiting o r blood, but has some nausea with BM, now down to 1 per wk on average, sometime 10 days to 2 wks.  No wt loss. Some abd discomfort, passes gas ok.  Not very active overall, no regular exercise.  Tried stool softner but no help. Has not tried laxatives.  No fever. No recent diet change. Has 2 kids, work full time, married but states husband not much help.  Denies worsening depressive symptoms, suicidal ideation, or panic; but with onoing significant fatigue.  Gets about 3 hrs per sleep at night due to kids being up sometimes and she cant get back to sleep Past Medical History  Diagnosis Date  . ECZEMA 03/19/2009  . HYPERLIPIDEMIA 12/09/2009  . ANXIETY 04/03/2007  . DEPRESSION 04/03/2007  . MIGRAINE, COMMON 12/09/2009  . HYPERTENSION 04/03/2007  . ALLERGIC RHINITIS 04/03/2007  . Dysrhythmia     palpitations due to stress  . Shortness of breath     with anxiety  . Heartburn in pregnancy   . Postoperative anemia due to acute blood loss 08/30/2012   Past Surgical History  Procedure Laterality Date  . Cesarean section  2010  . Cesarean section N/A 08/29/2012    Procedure: Repeat CESAREAN SECTION;  Surgeon: Lenoard Aden, MD;   Location: [facility];  Service: Obstetrics;  Laterality: N/A;  EDD: 09/17/12;REQUEST DEE;[name]    reports that she has never smoked. She has never used smokeless tobacco. She reports that she does not drink alcohol or use illicit drugs. family history includes Diabetes in her mother; Hypertension in her other; Thyroid disease in her other. Allergies  Allergen Reactions  . Latex Itching    swelling  . Peanut-Containing Drug Products     REACTION: throat swelling  . Penicillins     REACTION: itching   Current Outpatient Prescriptions on File Prior to Visit  Medication Sig Dispense Refill  . amLODipine (NORVASC) 10 MG tablet Take 1 tablet (10 mg total) by mouth daily.  90 tablet  3  . hydrochlorothiazide (HYDRODIURIL) 25 MG tablet Take 1 tablet (25 mg total) by mouth daily.  90 tablet  3  . metoprolol succinate (TOPROL XL) 50 MG 24 hr tablet Take 1 tablet (50 mg total) by mouth daily. - generic please  90 tablet  3  . fexofenadine (ALLEGRA) 180 MG tablet Take 1 tablet (180 mg total) by mouth daily.  30 tablet  11  . mometasone (NASONEX) 50 MCG/ACT nasal spray Place 2 sprays into the nose daily.  17 g  12   No current facility-administered medications on file prior to visit.   Review of Systems  Constitutional: Negative for unexpected weight change, or unusual diaphoresis  HENT: Negative for tinnitus.  Eyes: Negative for photophobia and visual disturbance.  Respiratory: Negative for choking and stridor.   Gastrointestinal: Negative for vomiting and blood in stool.  Genitourinary: Negative for hematuria and decreased urine volume.  Musculoskeletal: Negative for acute joint swelling Skin: Negative for color change and wound.  Neurological: Negative for tremors and numbness other than noted  Psychiatric/Behavioral: Negative for decreased concentration or  hyperactivity.       Objective:   Physical Exam BP 122/80  Pulse 78  Temp(Src) 98.3 F (36.8 C) (Oral)  Ht 5\' 2"  (1.575 m)  Wt  138 lb 12 oz (62.937 kg)  BMI 25.37 kg/m2  SpO2 99% VS noted,  Constitutional: Pt appears well-developed and well-nourished.  HENT: Head: NCAT.  Right Ear: External ear normal.  Left Ear: External ear normal.  Eyes: Conjunctivae and EOM are normal. Pupils are equal, round, and reactive to light.  Neck: Normal range of motion. Neck supple.  Cardiovascular: Normal rate and regular rhythm.   Pulmonary/Chest: Effort normal and breath sounds normal.  Abd:  Soft, NT, minor distension/bloated with abd obesity but not overly firm or tender, + BS Neurological: Pt is alert. Not confused  Skin: Skin is warm. No erythema.  Psychiatric: Pt behavior is normal. Thought content normal.     Assessment & Plan:

## 2013-03-26 NOTE — Assessment & Plan Note (Addendum)
Exam with mild bloating but o/w benign, prob c/w her reported constipation, for labs,  to f/u any worsening symptoms or concerns  Note:  Total time for pt hx, exam, review of record with pt in the room, determination of diagnoses and plan for further eval and tx is > 40 min, with over 50% spent in coordination and counseling of patient

## 2013-03-26 NOTE — Assessment & Plan Note (Signed)
Ok to continue meds for now, consider d/c hct if constipatoin persists

## 2013-03-26 NOTE — Patient Instructions (Addendum)
Please try OTC Miralax daily, and you can also use an occasional Dulcolox as needed  We can consider a prescription for Linzess if not improved in 7-10 days  Please continue all other medications as before, and refills have been done if requested. Please have the pharmacy call with any other refills you may need.  Please go to the LAB in the Basement (turn left off the elevator) for the tests to be done today You will be contacted by phone if any changes need to be made immediately.  Otherwise, you will receive a letter about your results with an explanation, but please check with MyChart first.  Please keep your appointments with your specialists as you may have planned  Please return in 1 year for your yearly visit, or sooner if needed

## 2013-03-27 NOTE — Telephone Encounter (Signed)
Called the patient informed of results and MD instructions.  The patient will call back at her convenience to schedule 1 week Followup.

## 2013-03-29 ENCOUNTER — Encounter: Payer: Self-pay | Admitting: Internal Medicine

## 2013-06-04 ENCOUNTER — Ambulatory Visit: Payer: BC Managed Care – PPO | Admitting: Internal Medicine

## 2013-06-05 ENCOUNTER — Encounter: Payer: Self-pay | Admitting: Internal Medicine

## 2013-06-05 ENCOUNTER — Ambulatory Visit (INDEPENDENT_AMBULATORY_CARE_PROVIDER_SITE_OTHER): Payer: BC Managed Care – PPO | Admitting: Internal Medicine

## 2013-06-05 VITALS — BP 110/60 | HR 69 | Temp 98.1°F | Ht 62.0 in | Wt 137.0 lb

## 2013-06-05 DIAGNOSIS — J310 Chronic rhinitis: Secondary | ICD-10-CM

## 2013-06-05 DIAGNOSIS — L500 Allergic urticaria: Secondary | ICD-10-CM

## 2013-06-05 NOTE — Patient Instructions (Signed)
Plain Mucinex (NOT D) for thick secretions ;force NON dairy fluids .   Nasal cleansing in the shower as discussed with lather of mild shampoo.After 10 seconds wash off lather while  exhaling through nostrils. Make sure that all residual soap is removed to prevent irritation.  Flonase OR Nasacort AQ 1 spray in each nostril twice a day as needed. Use the "crossover" technique into opposite nostril spraying toward opposite ear @ 45 degree angle, not straight up into nostril.  Use a Neti pot daily only  as needed for significant sinus congestion; going from open side to congested side . Plain Allegra (NOT D )  160 daily , Loratidine 10 mg , OR Zyrtec 10 mg @ bedtime  as needed for itchy eyes & sneezing. Urticaria or hives represent an allergic reaction to foods or medications. At the top the list of causesare strawberries, tomatoes, chocolate, shellfish and nuts. You should avoid these as much is possible if having rash. Take medications as prescribed. Use hypoallergenic soaps and detergents.

## 2013-06-05 NOTE — Progress Notes (Signed)
Pre visit review using our clinic review tool, if applicable. No additional management support is needed unless otherwise documented below in the visit note. 

## 2013-06-05 NOTE — Progress Notes (Signed)
   Subjective:    Patient ID: Kristie Nelson, female    DOB: Dec 25, 1980, 33 y.o.   MRN: [id number]  HPI  She developed a raised diffuse pruritic rash 2-3 weeks ago without any specific trigger. It was localized over both arms and thorax. She described as red and itching.  She had taken Benadryl which improved the itching. She used this  orally and topically.  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  Even though the rash is gone she kept the appointment because she's developed some upper respiratory tract symptoms in the last 24 hours. She has nasal congestion, sore throat, and some frontal headache.  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  Review of Systems  She denies fever, chills, or sweats.  She is not having any angioedema of symptoms at this time. She also had no wall the rash was active  She has no cough or sputum production. There is no associated dyspnea or wheezing.         Objective:   Physical Exam  No dermatographia is elicitable.  She does have some tonsillar concretions on the right without significant erythema or edema.  General appearance:good health ;well nourished; no acute distress or increased work of breathing is present.  No  lymphadenopathy about the head, neck, or axilla noted.   Eyes: No conjunctival inflammation or lid edema is present. There is no scleral icterus.  Ears:  External ear exam shows no significant lesions or deformities.  Otoscopic examination reveals clear canals, tympanic membranes are intact bilaterally without bulging, retraction, inflammation or discharge.  Nose:  External nasal examination shows no deformity or inflammation. Nasal mucosa are pink and moist without lesions or exudates. No septal dislocation or deviation.No obstruction to airflow.   Oral exam: Dental hygiene  is good; lips and gums are healthy appearing. Neck:  No deformities, thyromegaly, masses, or tenderness noted.   Supple with full range of motion without pain.   Heart:  Normal rate and regular rhythm. S1 and S2 normal without gallop, murmur, click, rub or other extra sounds.   Lungs:Chest clear to auscultation; no wheezes, rhonchi,rales ,or rubs present.No increased work of breathing.    Extremities:  No cyanosis, edema, or clubbing  noted    Skin: Warm & dry .         Assessment & Plan:  #1 urticarial rash of upper extremities and thorax. 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  #3 rhinitis, new onset. Rhinosinusitis is not suggested clinically or by history  Plan: See after visit summary

## 2013-07-02 ENCOUNTER — Encounter: Payer: Self-pay | Admitting: Internal Medicine

## 2013-07-02 ENCOUNTER — Ambulatory Visit (INDEPENDENT_AMBULATORY_CARE_PROVIDER_SITE_OTHER): Payer: BC Managed Care – PPO | Admitting: Internal Medicine

## 2013-07-02 VITALS — BP 128/80 | HR 73 | Temp 98.1°F | Wt 136.4 lb

## 2013-07-02 DIAGNOSIS — M5459 Other low back pain: Secondary | ICD-10-CM

## 2013-07-02 DIAGNOSIS — M545 Low back pain, unspecified: Secondary | ICD-10-CM

## 2013-07-02 MED ORDER — OXYCODONE-ACETAMINOPHEN 7.5-325 MG PO TABS: 1.0000 | ORAL_TABLET | ORAL | Status: DC | PRN

## 2013-07-02 MED ORDER — CYCLOBENZAPRINE HCL 5 MG PO TABS: ORAL_TABLET | ORAL | Status: DC

## 2013-07-02 NOTE — Progress Notes (Signed)
   Subjective:    Patient ID: Kristie Nelson, female    DOB: 1980/05/03, 33 y.o.   MRN: [id number]  HPI    She injured her back acutely at 9 AM yesterday. She was holding her infant on her left hipas she bent down to pick up a toy. She felt a sudden pain in the right lumbosacral area and had to ease her way to the floor. Since that time she's had severe right-sided lumbosacral pain which is constant. This has not increased in severity.  She has a past medical history of injury to her back while dancing. That was treated with muscle relaxants and pain medications with resolution.   Review of Systems  She denies any numbness, tingling, weakness of the extremities.  No loss of bowel bladder or bowels.     Objective:   Physical Exam    Significant or distinguishing  findings on physical exam are documented first.  Below that are other systems examined & findings.  She sits in chair leaning to her left. She exhibits the classic "low back crawl" when reclining on  and sitting up from  the examining table.  General appearance is one of good health and nourishment w/o distress.  Eyes: No conjunctival inflammation or scleral icterus is present.  Heart:  Normal rate and regular rhythm. S1 and S2 normal without gallop, murmur, click, rub or other extra sounds    Lungs:Chest clear to auscultation; no wheezes, rhonchi,rales ,or rubs present.No increased work of breathing.   Abdomen: bowel sounds normal, soft and non-tender without masses, organomegaly or hernias noted.  No guarding or rebound . No tenderness over the flanks to percussion  Skin:Warm & dry.  Intact without suspicious lesions or rashes   Lymphatic: No lymphadenopathy is noted about the head, neck, axilla          Assessment & Plan:  #1 acute low back strain See orders

## 2013-07-02 NOTE — Progress Notes (Signed)
   Subjective:    Patient ID: Kristie Nelson, female    DOB: Mar 16, 1980, 33 y.o.   MRN: [id number]  HPI Pt injured her back yesterday at 9am. She was holding her baby on her L hip and bent down to get a toy. When bending over she felt a sudden pain in her R sided lumbar region and had to ease her way to the floor. Since this time the pain in her R sided lumbar region has been constant and has not worsened in severity. She cannot describe the nature of the pain but does say it is not spasms. Yesterday she had pain in both sides of her buttocks as well as down her hamstrings. Today the pain only radiates down her R buttock and hamstring. Sitting with weight on her L buttock makes the pain better. When laying down it feels better to lay on her R side. Walking and movement hurt her. It is not tender touch. She has taken percocet for the pain which has helped.   Review of Systems Denies loss in bowel or bladder. Denies radiation to her feet, upper back or abdomen. Denies numbness, tingling, or weakness in extremetites.     Objective:   Physical Exam Pt limps and favors L side to bare weight      Assessment & Plan:  #1 lumbar strain; give tramadol for pain. Advise pt to apply ice and stop activity x 4 weeks.

## 2013-07-02 NOTE — Progress Notes (Signed)
Pre visit review using our clinic review tool, if applicable. No additional management support is needed unless otherwise documented below in the visit note. 

## 2013-07-02 NOTE — Patient Instructions (Signed)
The best exercises for the low back include freestyle swimming, stretch aerobics, and yoga.Cybex & Nautilus machines rather than dead weights are better for the back. If the  symptoms persist or progress; Physical Therapy recommended

## 2013-10-31 ENCOUNTER — Other Ambulatory Visit: Payer: Self-pay | Admitting: Internal Medicine

## 2013-11-04 ENCOUNTER — Encounter: Payer: Self-pay | Admitting: Internal Medicine

## 2013-11-06 ENCOUNTER — Encounter: Payer: Self-pay | Admitting: Family Medicine

## 2013-11-06 ENCOUNTER — Telehealth: Payer: Self-pay | Admitting: Internal Medicine

## 2013-11-06 ENCOUNTER — Ambulatory Visit (INDEPENDENT_AMBULATORY_CARE_PROVIDER_SITE_OTHER): Payer: BC Managed Care – PPO | Admitting: Family Medicine

## 2013-11-06 VITALS — BP 128/88 | HR 106 | Temp 101.5°F | Wt 140.8 lb

## 2013-11-06 DIAGNOSIS — B349 Viral infection, unspecified: Secondary | ICD-10-CM

## 2013-11-06 DIAGNOSIS — R5081 Fever presenting with conditions classified elsewhere: Secondary | ICD-10-CM

## 2013-11-06 LAB — POCT INFLUENZA A/B
Influenza A, POC: NEGATIVE
Influenza B, POC: NEGATIVE

## 2013-11-06 NOTE — Progress Notes (Signed)
Pre visit review using our clinic review tool, if applicable. No additional management support is needed unless otherwise documented below in the visit note.  Daughter had been sick recently.  She is better in the meantime.    Sx started about 4 days ago.  Sneezing and ST.  Rhinorrhea.   Head congestion.    Fevers and chills for 24 hours.  No cough.  No sputum.  No vomiting, no diarrhea.  Some nausea.  No rash, no ear pain.  Facial pain noted. Diffuse body aches.   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   Meds, vitals, and allergies reviewed.   ROS: See HPI.  Otherwise, noncontributory.  GEN: nad, alert and oriented HEENT: mucous membranes moist, tm w/o erythema, nasal exam w/o erythema, clear discharge noted,  OP with cobblestoning, sinuses not ttp NECK: supple w/o LA CV: rrr.   PULM: ctab, no inc wob EXT: no edema SKIN: no acute rash

## 2013-11-06 NOTE — Telephone Encounter (Signed)
Left patient vm to call back to schedule appt  °

## 2013-11-06 NOTE — Telephone Encounter (Signed)
Patient Information:  Caller Name: Kristie Nelson  Phone: [phone]  Patient: Kristie Nelson, Kristie Nelson  Gender: Female  DOB: 1980/02/23  Age: [age] Years  PCP: Other  Pregnant: No  Office Follow Up:  Does the office need to follow up with this patient?: No  Instructions For The Office: N/A   Symptoms  Reason For Call & Symptoms: Pt has congestion/nausea. Onset Sunday 11/03/13. NO fever. Pt is calling for an appt.  Reviewed Health History In EMR: Yes  Reviewed Medications In EMR: Yes  Reviewed Allergies In EMR: Yes  Reviewed Surgeries / Procedures: Yes  Date of Onset of Symptoms: 11/03/2013 OB / GYN:  LMP: 10/16/2013  Guideline(s) Used:  Colds  Disposition Per Guideline:   See Today or Tomorrow in Office  Reason For Disposition Reached:   Patient wants to be seen  Advice Given:  N/A  Patient Will Follow Care Advice:  YES  Appointment Scheduled:  11/06/2013 18:00:00 Appointment Scheduled Provider:  Crawford Givensuncan, Graham Clelia Croft(Shaw) [facility](Family Practice)

## 2013-11-06 NOTE — Patient Instructions (Signed)
Drink plenty of fluids, take tylenol or ibuprofen as needed, and this should gradually improve.  Take care.  Let us know if you have other concerns.

## 2013-11-06 NOTE — Telephone Encounter (Signed)
Ok to call pt to offer appt next avail

## 2013-11-07 DIAGNOSIS — B349 Viral infection, unspecified: Secondary | ICD-10-CM | POA: Insufficient documentation

## 2013-11-07 NOTE — Assessment & Plan Note (Signed)
Likely viral, nontoxic, flu neg.  D/w pt.  nonfocal exam. Supportive care.  Fu prn. She agrees.  Okay for outpatient f/u.  Out of work in the meantime.

## 2013-12-17 ENCOUNTER — Other Ambulatory Visit: Payer: Self-pay | Admitting: Internal Medicine

## 2013-12-30 ENCOUNTER — Telehealth: Payer: Self-pay | Admitting: Internal Medicine

## 2013-12-30 NOTE — Telephone Encounter (Signed)
Kristie Nelson

## 2014-01-01 MED ORDER — TRIAMCINOLONE ACETONIDE 0.1 % EX CREA: TOPICAL_CREAM | Freq: Two times a day (BID) | CUTANEOUS | Status: DC | PRN

## 2014-01-01 NOTE — Telephone Encounter (Signed)
Called the patient informed script sent in and referral can be done if she wishes.

## 2014-01-01 NOTE — Telephone Encounter (Signed)
Cream refilled, but I can refer to derm if she wants

## 2014-01-01 NOTE — Telephone Encounter (Signed)
Pt is calling again today to check to see if Dr Jonny RuizJohn could call her something in for her Eczema?

## 2014-02-07 ENCOUNTER — Other Ambulatory Visit: Payer: Self-pay | Admitting: Internal Medicine

## 2014-02-25 ENCOUNTER — Encounter: Payer: Self-pay | Admitting: Internal Medicine

## 2014-02-25 ENCOUNTER — Ambulatory Visit (INDEPENDENT_AMBULATORY_CARE_PROVIDER_SITE_OTHER): Payer: BC Managed Care – PPO | Admitting: Internal Medicine

## 2014-02-25 VITALS — BP 160/90 | HR 113 | Temp 101.0°F | Ht 62.0 in | Wt 139.5 lb

## 2014-02-25 DIAGNOSIS — J069 Acute upper respiratory infection, unspecified: Secondary | ICD-10-CM

## 2014-02-25 DIAGNOSIS — J209 Acute bronchitis, unspecified: Secondary | ICD-10-CM

## 2014-02-25 MED ORDER — AZITHROMYCIN 250 MG PO TABS: ORAL_TABLET | ORAL | Status: DC

## 2014-02-25 MED ORDER — PREDNISONE 20 MG PO TABS
20.0000 mg | ORAL_TABLET | Freq: Two times a day (BID) | ORAL | Status: DC
Start: 2014-02-25 — End: 2014-05-14

## 2014-02-25 MED ORDER — HYDROCODONE-HOMATROPINE 5-1.5 MG/5ML PO SYRP: 5.0000 mL | ORAL_SOLUTION | Freq: Four times a day (QID) | ORAL | Status: DC | PRN

## 2014-02-25 NOTE — Patient Instructions (Signed)
Plain Mucinex (NOT D) for thick secretions ;force NON dairy fluids .   Nasal cleansing in the shower as discussed with lather of mild shampoo.After 10 seconds wash off lather while  exhaling through nostrils. Make sure that all residual soap is removed to prevent irritation.  Flonase OR Nasacort AQ 1 spray in each nostril twice a day as needed. Use the "crossover" technique into opposite nostril spraying toward opposite ear @ 45 degree angle, not straight up into nostril.  Plain Allegra (NOT D )  160 daily , Loratidine 10 mg , OR Zyrtec 10 mg @ bedtime  as needed for itchy eyes & sneezing.  Carry room temperature water and sip liberally after coughing. NSAIDS ( Aleve, Advil, Naproxen) or Tylenol every 4 hrs as needed for fever as discussed based on label recommendations

## 2014-02-25 NOTE — Progress Notes (Signed)
   Subjective:    Patient ID: Kristie Nelson, female    DOB: 06-18-1980, 34 y.o.   MRN: [id number]  HPI   She has been ill since 02/08/14; initial symptoms were sore throat, cough, rhinitis. Symptoms were improving but then she developed progressive symptoms of paroxysmal cough which is nonproductive. This does disturbs sleep. She's also experiencing chills without associated sweats.  She has had frontal headache and temporal headaches as well up to level V. Rhinitis is associated with clear drainage at this time. Previously this was thick and yellow.  The cough is associated with some pleuritic chest discomfort. She also has had muscle aches.  Last night she did take Mucinex Sinus Max which contains a decongestant. She is now on plain Mucinex.  She has not taken the flu shot.  She is a nonsmoker. She has no history of asthma.  She has children 5 and one year of age. She is a third grade teacher.  Review of Systems Purulence. She also denies otic pain, otic discharge.  The cough is not associated with shortness of breath or wheezing.  She was unaware of fever until today's visit.    Objective:   Physical Exam  Pertinent or positive findings include: Repeat pulse was 100; repeat blood pressure was 110/70. She has erythema of the nares without associated exudate. She has severe racking paroxysms of cough which are nonproductive.  Skin is hot and dry.  General appearance:Adequately nourished; no acute distress or increased work of breathing is present.  No  lymphadenopathy about the head, neck, or axilla noted.  Eyes: No conjunctival inflammation or lid edema is present. There is no scleral icterus. Ears:  External ear exam shows no significant lesions or deformities.  Otoscopic examination reveals clear canals, tympanic membranes are intact bilaterally without bulging, retraction, inflammation or discharge. Nose:  External nasal examination shows no deformity or inflammation. No  septal dislocation or deviation.No obstruction to airflow.  Oral exam: Dental hygiene is good; lips and gums are healthy appearing.There is no oropharyngeal erythema or exudate noted.  Neck:  No deformities, thyromegaly, masses, or tenderness noted.   Supple with full range of motion without pain.  Heart:   regular rhythm. S1 and S2 normal without gallop, murmur, click, rub or other extra sounds.  Lungs:Chest clear to auscultation; no wheezes, rhonchi,rales ,or rubs present. Extremities:  No cyanosis, edema, or clubbing  noted      Assessment & Plan:  #1 acute bronchitis w/o bronchospasm #2 URI, acute Plan: See orders and recommendations

## 2014-02-25 NOTE — Progress Notes (Signed)
Pre visit review using our clinic review tool, if applicable. No additional management support is needed unless otherwise documented below in the visit note. 

## 2014-03-03 ENCOUNTER — Telehealth: Payer: Self-pay | Admitting: Internal Medicine

## 2014-03-03 MED ORDER — BENZONATATE 200 MG PO CAPS: ORAL_CAPSULE | ORAL | Status: DC

## 2014-03-03 NOTE — Telephone Encounter (Signed)
Generic Tessalon 200 mg q 6-8 hrs prn #15

## 2014-03-03 NOTE — Telephone Encounter (Signed)
Patient has been notified

## 2014-03-03 NOTE — Telephone Encounter (Signed)
Patient has been taking HYDROcodone-homatropine (HYDROMET) 5-1.5 MG/5ML syrup [[id number]][[id number]] , but can not do this at work, as it makes her drowsy. She is asking if there is something else that she can take that is also cheap?

## 2014-05-13 ENCOUNTER — Ambulatory Visit: Payer: BC Managed Care – PPO | Admitting: Internal Medicine

## 2014-05-14 ENCOUNTER — Encounter: Payer: Self-pay | Admitting: Family

## 2014-05-14 ENCOUNTER — Ambulatory Visit (INDEPENDENT_AMBULATORY_CARE_PROVIDER_SITE_OTHER): Payer: BC Managed Care – PPO | Admitting: Family

## 2014-05-14 ENCOUNTER — Ambulatory Visit: Payer: BC Managed Care – PPO | Admitting: Internal Medicine

## 2014-05-14 VITALS — BP 128/82 | HR 72 | Temp 97.9°F | Resp 18 | Ht 62.0 in | Wt 142.8 lb

## 2014-05-14 DIAGNOSIS — S39012A Strain of muscle, fascia and tendon of lower back, initial encounter: Secondary | ICD-10-CM

## 2014-05-14 MED ORDER — MELOXICAM 7.5 MG PO TABS: 7.5000 mg | ORAL_TABLET | Freq: Every day | ORAL | Status: DC

## 2014-05-14 MED ORDER — METHOCARBAMOL 500 MG PO TABS: 500.0000 mg | ORAL_TABLET | Freq: Three times a day (TID) | ORAL | Status: DC | PRN

## 2014-05-14 NOTE — Progress Notes (Signed)
Pre visit review using our clinic review tool, if applicable. No additional management support is needed unless otherwise documented below in the visit note. 

## 2014-05-14 NOTE — Assessment & Plan Note (Signed)
Symptoms and exam consistent with low back strain/muscle spasm. Start meloxicam as needed for inflammation. Start Robaxin as needed for muscle spasm. Continue conservative treatment of heat and stretching. Patient provided information on stretching activities. Follow-up if symptoms worsen or fail to improve.

## 2014-05-14 NOTE — Progress Notes (Signed)
Subjective:    Patient ID: Kristie Nelson, female    DOB: [birth year], 34 y.o.   MRN: [id number]  Chief Complaint  Patient presents with  . Back Pain    started last week with some pain, on sunday afternoon tried picking her daughter up and said her back went out, it is the lower part of her back, says she has been taking tylenol and using heat but both don't really seem to do much    HPI:  Kristie Nelson is a 34 y.o. female with a PMH of hyperlipidemia, hypertension, eczema, anxiety, depression, and migraines who presents today for an acute office visit.  This is a new problem. Associated symptom of pain located in her lower back has been going on for approximately one week, however worsened when attempting to pick her daughter up approximately 3 days ago. Modifying factors of Tylenol and heat really do not seem to help. Pain is described as sharp with pain with radiculopathy down both legs with the right being greater than the left. Severity of the pain is "the worst back pain" that she has had. Functionality is decreased secondary to pain and activities of daily living are slightly decreased. Indicates that she has a previous history of back issues from dancing in college and she recently restarted dancing which is the potential cause of her pain. Denies changes to bowel or bladder habits.    Allergies  Allergen Reactions  . Latex Itching    swelling  . Peanut-Containing Drug Products     REACTION: throat swelling  . Penicillins     REACTION: itching    Current Outpatient Prescriptions on File Prior to Visit  Medication Sig Dispense Refill  . amLODipine (NORVASC) 10 MG tablet TAKE ONE TABLET BY MOUTH ONCE DAILY 90 tablet 1  . fexofenadine (ALLEGRA) 180 MG tablet Take 180 mg by mouth daily.    . metoprolol succinate (TOPROL-XL) 50 MG 24 hr tablet TAKE ONE TABLET BY MOUTH ONCE DAILY 90 tablet 1  . triamcinolone cream (KENALOG) 0.1 % Apply topically 2 (two) times daily as needed. 30  g 1   No current facility-administered medications on file prior to visit.    Past Medical History  Diagnosis Date  . ECZEMA 03/19/2009  . HYPERLIPIDEMIA 12/09/2009  . ANXIETY 04/03/2007  . DEPRESSION 04/03/2007  . MIGRAINE, COMMON 12/09/2009  . HYPERTENSION 04/03/2007  . ALLERGIC RHINITIS 04/03/2007  . Dysrhythmia     palpitations due to stress  . Shortness of breath     with anxiety  . Heartburn in pregnancy   . Postoperative anemia due to acute blood loss 08/30/2012    Review of Systems  Musculoskeletal: Positive for back pain.  Neurological: Negative for numbness.      Objective:    BP 128/82 mmHg  Pulse 72  Temp(Src) 97.9 F (36.6 C) (Oral)  Resp 18  Ht 5\' 2"  (1.575 m)  Wt 142 lb 12.8 oz (64.774 kg)  BMI 26.11 kg/m2  SpO2 99% Nursing note and vital signs reviewed.  Physical Exam  Constitutional: She is oriented to person, place, and time. She appears well-developed and well-nourished. No distress.  Cardiovascular: Normal rate, regular rhythm, normal heart sounds and intact distal pulses.   Pulmonary/Chest: Effort normal and breath sounds normal.  Musculoskeletal:  Movement is slow to get up out of the chair. No obvious deformity, discoloration, or deformity of low back noted. Palpable muscle spasms located right lumbar paraspinal musculature. Range of motion is significantly  decreased in flexion secondary to pain. Straight leg raise is positive.  Neurological: She is alert and oriented to person, place, and time.  Skin: Skin is warm and dry.  Psychiatric: She has a normal mood and affect. Her behavior is normal. Judgment and thought content normal.       Assessment & Plan:

## 2014-05-14 NOTE — Patient Instructions (Signed)
Thank you for choosing [facility].  Summary/Instructions:  Your prescription(s) have been submitted to your pharmacy or been printed and provided for you. Please take as directed and contact our office if you believe you are having problem(s) with the medication(s) or have any questions.  If your symptoms worsen or fail to improve, please contact our office for further instruction, or in case of emergency go directly to the emergency room at the closest medical facility.   Low Back Strain with Rehab A strain is an injury in which a tendon or muscle is torn. The muscles and tendons of the lower back are vulnerable to strains. However, these muscles and tendons are very strong and require a great force to be injured. Strains are classified into three categories. Grade 1 strains cause pain, but the tendon is not lengthened. Grade 2 strains include a lengthened ligament, due to the ligament being stretched or partially ruptured. With grade 2 strains there is still function, although the function may be decreased. Grade 3 strains involve a complete tear of the tendon or muscle, and function is usually impaired. SYMPTOMS   Pain in the lower back.  Pain that affects one side more than the other.  Pain that gets worse with movement and may be felt in the hip, buttocks, or back of the thigh.  Muscle spasms of the muscles in the back.  Swelling along the muscles of the back.  Loss of strength of the back muscles.  Crackling sound (crepitation) when the muscles are touched. CAUSES  Lower back strains occur when a force is placed on the muscles or tendons that is greater than they can handle. Common causes of injury include:  Prolonged overuse of the muscle-tendon units in the lower back, usually from incorrect posture.  A single violent injury or force applied to the back. RISK INCREASES WITH:  Sports that involve twisting forces on the spine or a lot of bending at the waist (football,  rugby, weightlifting, bowling, golf, tennis, speed skating, racquetball, swimming, running, gymnastics, diving).  Poor strength and flexibility.  Failure to warm up properly before activity.  Family history of lower back pain or disk disorders.  Previous back injury or surgery (especially fusion).  Poor posture with lifting, especially heavy objects.  Prolonged sitting, especially with poor posture. PREVENTION   Learn and use proper posture when sitting or lifting (maintain proper posture when sitting, lift using the knees and legs, not at the waist).  Warm up and stretch properly before activity.  Allow for adequate recovery between workouts.  Maintain physical fitness:  Strength, flexibility, and endurance.  Cardiovascular fitness. PROGNOSIS  If treated properly, lower back strains usually heal within 6 weeks. RELATED COMPLICATIONS   Recurring symptoms, resulting in a chronic problem.  Chronic inflammation, scarring, and partial muscle-tendon tear.  Delayed healing or resolution of symptoms.  Prolonged disability. TREATMENT  Treatment first involves the use of ice and medicine, to reduce pain and inflammation. The use of strengthening and stretching exercises may help reduce pain with activity. These exercises may be performed at home or with a therapist. Severe injuries may require referral to a therapist for further evaluation and treatment, such as ultrasound. Your caregiver may advise that you wear a back brace or corset, to help reduce pain and discomfort. Often, prolonged bed rest results in greater harm then benefit. Corticosteroid injections may be recommended. However, these should be reserved for the most serious cases. It is important to avoid using your back when   lifting objects. At night, sleep on your back on a firm mattress with a pillow placed under your knees. If non-surgical treatment is unsuccessful, surgery may be needed.  MEDICATION   If pain medicine  is needed, nonsteroidal anti-inflammatory medicines (aspirin and ibuprofen), or other minor pain relievers (acetaminophen), are often advised.  Do not take pain medicine for 7 days before surgery.  Prescription pain relievers may be given, if your caregiver thinks they are needed. Use only as directed and only as much as you need.  Ointments applied to the skin may be helpful.  Corticosteroid injections may be given by your caregiver. These injections should be reserved for the most serious cases, because they may only be given a certain number of times. HEAT AND COLD  Cold treatment (icing) should be applied for 10 to 15 minutes every 2 to 3 hours for inflammation and pain, and immediately after activity that aggravates your symptoms. Use ice packs or an ice massage.  Heat treatment may be used before performing stretching and strengthening activities prescribed by your caregiver, physical therapist, or athletic trainer. Use a heat pack or a warm water soak. SEEK MEDICAL CARE IF:   Symptoms get worse or do not improve in 2 to 4 weeks, despite treatment.  You develop numbness, weakness, or loss of bowel or bladder function.  New, unexplained symptoms develop. (Drugs used in treatment may produce side effects.) EXERCISES  RANGE OF MOTION (ROM) AND STRETCHING EXERCISES - Low Back Strain Most people with lower back pain will find that their symptoms get worse with excessive bending forward (flexion) or arching at the lower back (extension). The exercises which will help resolve your symptoms will focus on the opposite motion.  Your physician, physical therapist or athletic trainer will help you determine which exercises will be most helpful to resolve your lower back pain. Do not complete any exercises without first consulting with your caregiver. Discontinue any exercises which make your symptoms worse until you speak to your caregiver.  If you have pain, numbness or tingling which travels  down into your buttocks, leg or foot, the goal of the therapy is for these symptoms to move closer to your back and eventually resolve. Sometimes, these leg symptoms will get better, but your lower back pain may worsen. This is typically an indication of progress in your rehabilitation. Be very alert to any changes in your symptoms and the activities in which you participated in the 24 hours prior to the change. Sharing this information with your caregiver will allow him/her to most efficiently treat your condition.  These exercises may help you when beginning to rehabilitate your injury. Your symptoms may resolve with or without further involvement from your physician, physical therapist or athletic trainer. While completing these exercises, remember:  Restoring tissue flexibility helps normal motion to return to the joints. This allows healthier, less painful movement and activity.  An effective stretch should be held for at least 30 seconds.  A stretch should never be painful. You should only feel a gentle lengthening or release in the stretched tissue. FLEXION RANGE OF MOTION AND STRETCHING EXERCISES: STRETCH - Flexion, Single Knee to Chest   Lie on a firm bed or floor with both legs extended in front of you.  Keeping one leg in contact with the floor, bring your opposite knee to your chest. Hold your leg in place by either grabbing behind your thigh or at your knee.  Pull until you feel a gentle stretch in your   lower back. Hold __________ seconds.  Slowly release your grasp and repeat the exercise with the opposite side. Repeat __________ times. Complete this exercise __________ times per day.  STRETCH - Flexion, Double Knee to Chest   Lie on a firm bed or floor with both legs extended in front of you.  Keeping one leg in contact with the floor, bring your opposite knee to your chest.  Tense your stomach muscles to support your back and then lift your other knee to your chest. Hold  your legs in place by either grabbing behind your thighs or at your knees.  Pull both knees toward your chest until you feel a gentle stretch in your lower back. Hold __________ seconds.  Tense your stomach muscles and slowly return one leg at a time to the floor. Repeat __________ times. Complete this exercise __________ times per day.  STRETCH - Low Trunk Rotation  Lie on a firm bed or floor. Keeping your legs in front of you, bend your knees so they are both pointed toward the ceiling and your feet are flat on the floor.  Extend your arms out to the side. This will stabilize your upper body by keeping your shoulders in contact with the floor.  Gently and slowly drop both knees together to one side until you feel a gentle stretch in your lower back. Hold for __________ seconds.  Tense your stomach muscles to support your lower back as you bring your knees back to the starting position. Repeat the exercise to the other side. Repeat __________ times. Complete this exercise __________ times per day  EXTENSION RANGE OF MOTION AND FLEXIBILITY EXERCISES: STRETCH - Extension, Prone on Elbows   Lie on your stomach on the floor, a bed will be too soft. Place your palms about shoulder width apart and at the height of your head.  Place your elbows under your shoulders. If this is too painful, stack pillows under your chest.  Allow your body to relax so that your hips drop lower and make contact more completely with the floor.  Hold this position for __________ seconds.  Slowly return to lying flat on the floor. Repeat __________ times. Complete this exercise __________ times per day.  RANGE OF MOTION - Extension, Prone Press Ups  Lie on your stomach on the floor, a bed will be too soft. Place your palms about shoulder width apart and at the height of your head.  Keeping your back as relaxed as possible, slowly straighten your elbows while keeping your hips on the floor. You may adjust the  placement of your hands to maximize your comfort. As you gain motion, your hands will come more underneath your shoulders.  Hold this position __________ seconds.  Slowly return to lying flat on the floor. Repeat __________ times. Complete this exercise __________ times per day.  RANGE OF MOTION- Quadruped, Neutral Spine   Assume a hands and knees position on a firm surface. Keep your hands under your shoulders and your knees under your hips. You may place padding under your knees for comfort.  Drop your head and point your tail bone toward the ground below you. This will round out your lower back like an angry cat. Hold this position for __________ seconds.  Slowly lift your head and release your tail bone so that your back sags into a large arch, like an old horse.  Hold this position for __________ seconds.  Repeat this until you feel limber in your lower back.  Now,   find your "sweet spot." This will be the most comfortable position somewhere between the two previous positions. This is your neutral spine. Once you have found this position, tense your stomach muscles to support your lower back.  Hold this position for __________ seconds. Repeat __________ times. Complete this exercise __________ times per day.  STRENGTHENING EXERCISES - Low Back Strain These exercises may help you when beginning to rehabilitate your injury. These exercises should be done near your "sweet spot." This is the neutral, low-back arch, somewhere between fully rounded and fully arched, that is your least painful position. When performed in this safe range of motion, these exercises can be used for people who have either a flexion or extension based injury. These exercises may resolve your symptoms with or without further involvement from your physician, physical therapist or athletic trainer. While completing these exercises, remember:   Muscles can gain both the endurance and the strength needed for everyday  activities through controlled exercises.  Complete these exercises as instructed by your physician, physical therapist or athletic trainer. Increase the resistance and repetitions only as guided.  You may experience muscle soreness or fatigue, but the pain or discomfort you are trying to eliminate should never worsen during these exercises. If this pain does worsen, stop and make certain you are following the directions exactly. If the pain is still present after adjustments, discontinue the exercise until you can discuss the trouble with your caregiver. STRENGTHENING - Deep Abdominals, Pelvic Tilt  Lie on a firm bed or floor. Keeping your legs in front of you, bend your knees so they are both pointed toward the ceiling and your feet are flat on the floor.  Tense your lower abdominal muscles to press your lower back into the floor. This motion will rotate your pelvis so that your tail bone is scooping upwards rather than pointing at your feet or into the floor.  With a gentle tension and even breathing, hold this position for __________ seconds. Repeat __________ times. Complete this exercise __________ times per day.  STRENGTHENING - Abdominals, Crunches   Lie on a firm bed or floor. Keeping your legs in front of you, bend your knees so they are both pointed toward the ceiling and your feet are flat on the floor. Cross your arms over your chest.  Slightly tip your chin down without bending your neck.  Tense your abdominals and slowly lift your trunk high enough to just clear your shoulder blades. Lifting higher can put excessive stress on the lower back and does not further strengthen your abdominal muscles.  Control your return to the starting position. Repeat __________ times. Complete this exercise __________ times per day.  STRENGTHENING - Quadruped, Opposite UE/LE Lift   Assume a hands and knees position on a firm surface. Keep your hands under your shoulders and your knees under your  hips. You may place padding under your knees for comfort.  Find your neutral spine and gently tense your abdominal muscles so that you can maintain this position. Your shoulders and hips should form a rectangle that is parallel with the floor and is not twisted.  Keeping your trunk steady, lift your right hand no higher than your shoulder and then your left leg no higher than your hip. Make sure you are not holding your breath. Hold this position __________ seconds.  Continuing to keep your abdominal muscles tense and your back steady, slowly return to your starting position. Repeat with the opposite arm and leg. Repeat __________   times. Complete this exercise __________ times per day.  STRENGTHENING - Lower Abdominals, Double Knee Lift  Lie on a firm bed or floor. Keeping your legs in front of you, bend your knees so they are both pointed toward the ceiling and your feet are flat on the floor.  Tense your abdominal muscles to brace your lower back and slowly lift both of your knees until they come over your hips. Be certain not to hold your breath.  Hold __________ seconds. Using your abdominal muscles, return to the starting position in a slow and controlled manner. Repeat __________ times. Complete this exercise __________ times per day.  POSTURE AND BODY MECHANICS CONSIDERATIONS - Low Back Strain Keeping correct posture when sitting, standing or completing your activities will reduce the stress put on different body tissues, allowing injured tissues a chance to heal and limiting painful experiences. The following are general guidelines for improved posture. Your physician or physical therapist will provide you with any instructions specific to your needs. While reading these guidelines, remember:  The exercises prescribed by your provider will help you have the flexibility and strength to maintain correct postures.  The correct posture provides the best environment for your joints to work.  All of your joints have less wear and tear when properly supported by a spine with good posture. This means you will experience a healthier, less painful body.  Correct posture must be practiced with all of your activities, especially prolonged sitting and standing. Correct posture is as important when doing repetitive low-stress activities (typing) as it is when doing a single heavy-load activity (lifting). RESTING POSITIONS Consider which positions are most painful for you when choosing a resting position. If you have pain with flexion-based activities (sitting, bending, stooping, squatting), choose a position that allows you to rest in a less flexed posture. You would want to avoid curling into a fetal position on your side. If your pain worsens with extension-based activities (prolonged standing, working overhead), avoid resting in an extended position such as sleeping on your stomach. Most people will find more comfort when they rest with their spine in a more neutral position, neither too rounded nor too arched. Lying on a non-sagging bed on your side with a pillow between your knees, or on your back with a pillow under your knees will often provide some relief. Keep in mind, being in any one position for a prolonged period of time, no matter how correct your posture, can still lead to stiffness. PROPER SITTING POSTURE In order to minimize stress and discomfort on your spine, you must sit with correct posture. Sitting with good posture should be effortless for a healthy body. Returning to good posture is a gradual process. Many people can work toward this most comfortably by using various supports until they have the flexibility and strength to maintain this posture on their own. When sitting with proper posture, your ears will fall over your shoulders and your shoulders will fall over your hips. You should use the back of the chair to support your upper back. Your lower back will be in a neutral  position, just slightly arched. You may place a small pillow or folded towel at the base of your lower back for support.  When working at a desk, create an environment that supports good, upright posture. Without extra support, muscles tire, which leads to excessive strain on joints and other tissues. Keep these recommendations in mind: CHAIR:  A chair should be able to slide under your   desk when your back makes contact with the back of the chair. This allows you to work closely.  The chair's height should allow your eyes to be level with the upper part of your monitor and your hands to be slightly lower than your elbows. BODY POSITION  Your feet should make contact with the floor. If this is not possible, use a foot rest.  Keep your ears over your shoulders. This will reduce stress on your neck and lower back. INCORRECT SITTING POSTURES  If you are feeling tired and unable to assume a healthy sitting posture, do not slouch or slump. This puts excessive strain on your back tissues, causing more damage and pain. Healthier options include:  Using more support, like a lumbar pillow.  Switching tasks to something that requires you to be upright or walking.  Talking a brief walk.  Lying down to rest in a neutral-spine position. PROLONGED STANDING WHILE SLIGHTLY LEANING FORWARD  When completing a task that requires you to lean forward while standing in one place for a long time, place either foot up on a stationary 2-4 inch high object to help maintain the best posture. When both feet are on the ground, the lower back tends to lose its slight inward curve. If this curve flattens (or becomes too large), then the back and your other joints will experience too much stress, tire more quickly, and can cause pain. CORRECT STANDING POSTURES Proper standing posture should be assumed with all daily activities, even if they only take a few moments, like when brushing your teeth. As in sitting, your ears  should fall over your shoulders and your shoulders should fall over your hips. You should keep a slight tension in your abdominal muscles to brace your spine. Your tailbone should point down to the ground, not behind your body, resulting in an over-extended swayback posture.  INCORRECT STANDING POSTURES  Common incorrect standing postures include a forward head, locked knees and/or an excessive swayback. WALKING Walk with an upright posture. Your ears, shoulders and hips should all line-up. PROLONGED ACTIVITY IN A FLEXED POSITION When completing a task that requires you to bend forward at your waist or lean over a low surface, try to find a way to stabilize 3 out of 4 of your limbs. You can place a hand or elbow on your thigh or rest a knee on the surface you are reaching across. This will provide you more stability so that your muscles do not fatigue as quickly. By keeping your knees relaxed, or slightly bent, you will also reduce stress across your lower back. CORRECT LIFTING TECHNIQUES DO :   Assume a wide stance. This will provide you more stability and the opportunity to get as close as possible to the object which you are lifting.  Tense your abdominals to brace your spine. Bend at the knees and hips. Keeping your back locked in a neutral-spine position, lift using your leg muscles. Lift with your legs, keeping your back straight.  Test the weight of unknown objects before attempting to lift them.  Try to keep your elbows locked down at your sides in order get the best strength from your shoulders when carrying an object.  Always ask for help when lifting heavy or awkward objects. INCORRECT LIFTING TECHNIQUES DO NOT:   Lock your knees when lifting, even if it is a small object.  Bend and twist. Pivot at your feet or move your feet when needing to change directions.  Assume that you   can safely pick up even a paper clip without proper posture. Document Released: 12/20/2004 Document  Revised: 03/14/2011 Document Reviewed: 04/03/2008 [employer] Patient Information 2015 [employer]. This information is not intended to replace advice given to you by your health care provider. Make sure you discuss any questions you have with your health care provider.   

## 2014-07-01 ENCOUNTER — Other Ambulatory Visit: Payer: Self-pay | Admitting: Internal Medicine

## 2014-08-13 ENCOUNTER — Other Ambulatory Visit: Payer: Self-pay | Admitting: Internal Medicine

## 2014-08-14 ENCOUNTER — Ambulatory Visit (INDEPENDENT_AMBULATORY_CARE_PROVIDER_SITE_OTHER): Payer: BC Managed Care – PPO | Admitting: Internal Medicine

## 2014-08-14 ENCOUNTER — Encounter: Payer: Self-pay | Admitting: Internal Medicine

## 2014-08-14 VITALS — BP 132/92 | HR 78 | Temp 98.0°F | Ht 62.0 in | Wt 142.0 lb

## 2014-08-14 DIAGNOSIS — F411 Generalized anxiety disorder: Secondary | ICD-10-CM | POA: Diagnosis not present

## 2014-08-14 DIAGNOSIS — I10 Essential (primary) hypertension: Secondary | ICD-10-CM

## 2014-08-14 DIAGNOSIS — J029 Acute pharyngitis, unspecified: Secondary | ICD-10-CM | POA: Insufficient documentation

## 2014-08-14 MED ORDER — AZITHROMYCIN 250 MG PO TABS: ORAL_TABLET | ORAL | Status: DC

## 2014-08-14 NOTE — Assessment & Plan Note (Signed)
Borderline elev today,  BP Readings from Last 3 Encounters:  08/14/14 132/92  05/14/14 128/82  02/25/14 160/90   O/w stable overall by history and exam, recent data reviewed with pt, and pt to continue medical treatment as before,  to f/u any worsening symptoms or concerns, to cont to monitor BP at home, return for > 140/90

## 2014-08-14 NOTE — Progress Notes (Signed)
   Subjective:    Patient ID: Kristie Nelson, female    DOB: 11-09-1980, 34 y.o.   MRN: [id number]  HPI   Here with 2-3 days acute onset fever, severe ST pain, pressure, headache, general weakness and malaise, and minor nonprod cough, but pt denies chest pain, wheezing, increased sob or doe, orthopnea, PND, increased LE swelling, palpitations, dizziness or syncope.  Pt denies new neurological symptoms such as new headache, or facial or extremity weakness or numbness   Pt denies polydipsia, polyuria,  Denies worsening depressive symptoms, suicidal ideation, or panic Past Medical History  Diagnosis Date  . ECZEMA 03/19/2009  . HYPERLIPIDEMIA 12/09/2009  . ANXIETY 04/03/2007  . DEPRESSION 04/03/2007  . MIGRAINE, COMMON 12/09/2009  . HYPERTENSION 04/03/2007  . ALLERGIC RHINITIS 04/03/2007  . Dysrhythmia     palpitations due to stress  . Shortness of breath     with anxiety  . Heartburn in pregnancy   . Postoperative anemia due to acute blood loss 08/30/2012   Past Surgical History  Procedure Laterality Date  . Cesarean section  2010  . Cesarean section N/A 08/29/2012    Procedure: Repeat CESAREAN SECTION;  Surgeon: Lenoard Aden, MD;  Location: [facility];  Service: Obstetrics;  Laterality: N/A;  EDD: 09/17/12;REQUEST DEE;Kristie Nelson    reports that she has never smoked. She has never used smokeless tobacco. She reports that she does not drink alcohol or use illicit drugs. family history includes Diabetes in her mother; Hypertension in her other; Thyroid disease in her other. Allergies  Allergen Reactions  . Latex Itching    swelling  . Peanut-Containing Drug Products     REACTION: throat swelling  . Penicillins     REACTION: itching   Review of Systems  Constitutional: Negative for unusual diaphoresis or night sweats HENT: Negative for ringing in ear or discharge Eyes: Negative for double vision or worsening visual disturbance.  Respiratory: Negative for choking and stridor.     Gastrointestinal: Negative for vomiting or other signifcant bowel change Genitourinary: Negative for hematuria or change in urine volume.  Musculoskeletal: Negative for other MSK pain or swelling Skin: Negative for color change and worsening wound.  Neurological: Negative for tremors and numbness other than noted  Psychiatric/Behavioral: Negative for decreased concentration or agitation other than above       Objective:   Physical Exam BP 132/92 mmHg  Pulse 78  Temp(Src) 98 F (36.7 C) (Oral)  Ht  (1.575 m)  Wt 142 lb (64.411 kg)  BMI 25.97 kg/m2  SpO2 98%  LMP 07/31/2014 VS noted, mild ill Constitutional: Pt appears in no significant distress HENT: Head: NCAT.  Right Ear: External ear normal.  Left Ear: External ear normal.  Eyes: . Pupils are equal, round, and reactive to light. Conjunctivae and EOM are normal Bilat tm's with mild erythema.  Max sinus areas mild tender.  Pharynx with severe erythema, + exudate with mod enlarged tonsils Neck: Normal range of motion. Neck supple.  Cardiovascular: Normal rate and regular rhythm.   Pulmonary/Chest: Effort normal and breath sounds without rales or wheezing.  Neurological: Pt is alert. Not confused , motor grossly intact Skin: Skin is warm. No rash, no LE edema Psychiatric: Pt behavior is normal. No agitation.     Assessment & Plan:

## 2014-08-14 NOTE — Patient Instructions (Signed)
Please take all new medication as prescribed- the antibiotic  Please check your blood pressure on a regular basis at home and call in 1-2 wks if more often than not more than 140/90  Please continue all other medications as before, and refills have been done if requested.  Please have the pharmacy call with any other refills you may need.  Please keep your appointments with your specialists as you may have planned

## 2014-08-14 NOTE — Assessment & Plan Note (Signed)
stable overall by history and exam, recent data reviewed with pt, and pt to continue medical treatment as before,  to f/u any worsening symptoms or concerns Lab Results  Component Value Date   WBC 6.3 03/26/2013   HGB 12.4 03/26/2013   HCT 37.5 03/26/2013   PLT 289.0 03/26/2013   GLUCOSE 72 03/26/2013   CHOL 199 03/26/2013   TRIG 82.0 03/26/2013   HDL 44.30 03/26/2013   LDLDIRECT 109.3 10/17/2007   LDLCALC 138* 03/26/2013   ALT 14 03/26/2013   AST 20 03/26/2013   NA 136 03/26/2013   K 2.8* 03/26/2013   CL 99 03/26/2013   CREATININE 0.7 03/26/2013   BUN 12 03/26/2013   CO2 30 03/26/2013   TSH 0.77 03/26/2013    

## 2014-08-14 NOTE — Assessment & Plan Note (Signed)
Mild to mod with exudate, for antibx course,  to f/u any worsening symptoms or concerns

## 2014-08-14 NOTE — Progress Notes (Signed)
Pre visit review using our clinic review tool, if applicable. No additional management support is needed unless otherwise documented below in the visit note. 

## 2014-08-15 ENCOUNTER — Other Ambulatory Visit: Payer: Self-pay | Admitting: Internal Medicine

## 2014-10-17 ENCOUNTER — Encounter: Payer: Self-pay | Admitting: Internal Medicine

## 2014-10-17 ENCOUNTER — Ambulatory Visit (INDEPENDENT_AMBULATORY_CARE_PROVIDER_SITE_OTHER): Payer: BC Managed Care – PPO | Admitting: Internal Medicine

## 2014-10-17 ENCOUNTER — Ambulatory Visit: Payer: BC Managed Care – PPO | Admitting: Family Medicine

## 2014-10-17 VITALS — BP 150/88 | HR 111 | Temp 101.1°F | Wt 143.0 lb

## 2014-10-17 DIAGNOSIS — J029 Acute pharyngitis, unspecified: Secondary | ICD-10-CM | POA: Diagnosis not present

## 2014-10-17 DIAGNOSIS — R509 Fever, unspecified: Secondary | ICD-10-CM | POA: Diagnosis not present

## 2014-10-17 LAB — POCT RAPID STREP A (OFFICE): RAPID STREP A SCREEN: NEGATIVE

## 2014-10-17 LAB — POCT INFLUENZA A/B
INFLUENZA A, POC: NEGATIVE
INFLUENZA B, POC: NEGATIVE

## 2014-10-17 MED ORDER — DOXYCYCLINE HYCLATE 100 MG PO TABS: 100.0000 mg | ORAL_TABLET | Freq: Two times a day (BID) | ORAL | Status: DC

## 2014-10-17 NOTE — Patient Instructions (Signed)

## 2014-10-17 NOTE — Addendum Note (Signed)
Addended by: Roena MaladyEVONTENNO, Desteny Freeman Y on: 10/17/2014 02:58 PM   Modules accepted: Orders

## 2014-10-17 NOTE — Progress Notes (Signed)
Subjective:    Patient ID: Kristie Nelson, female    DOB: August 21, 1980, 34 y.o.   MRN: [id number]  HPI  Pt presents to the clinic today with c/o sore throat. It started this morning. The right side is more swollen than the left side. She has had some difficulty swallowing. She has had fever, chills and body aches. She has taken Tylenol with minimal relief. She has not had sick contacts that she is aware of but she does work in a daycare. She was treated for an acute pharyngitis 08/2014.  Review of Systems      Past Medical History  Diagnosis Date  . ECZEMA 03/19/2009  . HYPERLIPIDEMIA 12/09/2009  . ANXIETY 04/03/2007  . DEPRESSION 04/03/2007  . MIGRAINE, COMMON 12/09/2009  . HYPERTENSION 04/03/2007  . ALLERGIC RHINITIS 04/03/2007  . Dysrhythmia     palpitations due to stress  . Shortness of breath     with anxiety  . Heartburn in pregnancy   . Postoperative anemia due to acute blood loss 08/30/2012    Current Outpatient Prescriptions  Medication Sig Dispense Refill  . amLODipine (NORVASC) 10 MG tablet TAKE ONE TABLET BY MOUTH ONCE DAILY 90 tablet 1  . metoprolol succinate (TOPROL-XL) 50 MG 24 hr tablet Take 1 tablet (50 mg total) by mouth daily. 90 tablet 1  . triamcinolone cream (KENALOG) 0.1 % Apply topically 2 (two) times daily as needed. 30 g 1  . fexofenadine (ALLEGRA) 180 MG tablet Take 180 mg by mouth daily.     No current facility-administered medications for this visit.    Allergies  Allergen Reactions  . Latex Itching    swelling  . Peanut-Containing Drug Products     REACTION: throat swelling  . Penicillins     REACTION: itching    Family History  Problem Relation Age of Onset  . Diabetes Mother   . Hypertension Other   . Thyroid disease Other     hypothyroidism    Social History   Social History  . Marital Status: Married    Spouse Name: N/A  . Number of Children: 1  . Years of Education: N/A   Occupational History  . TEACHER    Social History  Main Topics  . Smoking status: Never Smoker   . Smokeless tobacco: Never Used  . Alcohol Use: No  . Drug Use: No  . Sexual Activity: Not on file   Other Topics Concern  . 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 Denies fatigue, headache or abrupt weight changes.  HEENT: Pt reports sore throat. Denies eye pain, eye redness, ear pain, ringing in the ears, wax buildup, runny nose, nasal congestion, bloody nose. Respiratory: Denies difficulty breathing, shortness of breath, cough or sputum production.   Cardiovascular: Denies chest pain, chest tightness, palpitations or swelling in the hands or feet.  Gastrointestinal: Denies abdominal pain, bloating, constipation, diarrhea or blood in the stool.   No other specific complaints in a complete review of systems (except as listed in HPI above).  Objective:   Physical Exam  BP 150/88 mmHg  Pulse 111  Temp(Src) 101.1 F (38.4 C) (Oral)  Wt 143 lb (64.864 kg)  SpO2 98%  LMP 10/13/2014 Wt Readings from Last 3 Encounters:  10/17/14 143 lb (64.864 kg)  08/14/14 142 lb (64.411 kg)  05/14/14 142 lb 12.8 oz (64.774 kg)    General: Appears her stated age, ill appearing  in NAD. Skin: Warm, dry and intact. No rashes, lesions or ulcerations noted. HEENT: Head: normal shape and size, no sinus tenderness noted; Eyes: sclera white, no icterus, conjunctiva pink, PERRLA and EOMs intact; Ears: Tm's gray and intact, normal light reflex;  Throat/Mouth: Teeth present, mucosa erythematous and moist, tonsil 2+ no exudate, lesions or ulcerations noted.  Neck:  Cervical adenopathy noted. Cardiovascular: Tachycardic with normal rhythm. S1,S2 noted.  No murmur, rubs or gallops noted.  Pulmonary/Chest: Normal effort and positive vesicular breath sounds. No respiratory distress. No wheezes, rales or ronchi noted.  Neurological: Alert and oriented.     BMET    Component Value Date/Time   NA 136  03/26/2013 0951   K 2.8* 03/26/2013 0951   CL 99 03/26/2013 0951   CO2 30 03/26/2013 0951   GLUCOSE 72 03/26/2013 0951   BUN 12 03/26/2013 0951   CREATININE 0.7 03/26/2013 0951   CALCIUM 9.3 03/26/2013 0951   GFRNONAA >90 08/30/2012 0452   GFRAA >90 08/30/2012 0452    Lipid Panel     Component Value Date/Time   CHOL 199 03/26/2013 0951   TRIG 82.0 03/26/2013 0951   HDL 44.30 03/26/2013 0951   CHOLHDL 4 03/26/2013 0951   VLDL 16.4 03/26/2013 0951   LDLCALC 138* 03/26/2013 0951    CBC    Component Value Date/Time   WBC 6.3 03/26/2013 0951   RBC 4.06 03/26/2013 0951   HGB 12.4 03/26/2013 0951   HCT 37.5 03/26/2013 0951   PLT 289.0 03/26/2013 0951   MCV 92.5 03/26/2013 0951   MCH 29.9 08/30/2012 0452   MCHC 33.0 03/26/2013 0951   RDW 12.7 03/26/2013 0951   LYMPHSABS 1.8 03/26/2013 0951   MONOABS 0.3 03/26/2013 0951   EOSABS 0.1 03/26/2013 0951   BASOSABS 0.0 03/26/2013 0951    Hgb A1C No results found for: HGBA1C       Assessment & Plan:   Fever and sore throat:  RST: negative Will send throat culture Rapid Flu: negative Get some rest and drink plenty of fluids Salt water gargles for sore throat Work note provided eRx for Doxycycline 100 mg BID x 10 days Ibuprofen as needed for pain and inflammation Return precautions given  RTC as needed or if symptoms persist or worsen

## 2014-10-17 NOTE — Progress Notes (Signed)
Pre visit review using our clinic review tool, if applicable. No additional management support is needed unless otherwise documented below in the visit note. 

## 2014-10-19 LAB — CULTURE, GROUP A STREP

## 2015-01-16 ENCOUNTER — Telehealth: Payer: Self-pay | Admitting: Internal Medicine

## 2015-01-16 NOTE — Telephone Encounter (Signed)
Pt called in said that cream that Dr Jonny Ruizjohn gave her is not working.  Is there anything else that could be call in to help her with her eczema

## 2015-01-16 NOTE — Telephone Encounter (Signed)
Please advise pt to make an OV to evaluate rash. "Cream" last prescribed to her in 12/2013

## 2015-01-20 NOTE — Telephone Encounter (Signed)
Pt called back appt made

## 2015-01-21 ENCOUNTER — Ambulatory Visit (INDEPENDENT_AMBULATORY_CARE_PROVIDER_SITE_OTHER): Payer: BC Managed Care – PPO | Admitting: Internal Medicine

## 2015-01-21 VITALS — BP 148/94 | HR 85 | Temp 98.1°F | Ht 62.0 in | Wt 147.0 lb

## 2015-01-21 DIAGNOSIS — H1010 Acute atopic conjunctivitis, unspecified eye: Secondary | ICD-10-CM | POA: Insufficient documentation

## 2015-01-21 DIAGNOSIS — L309 Dermatitis, unspecified: Secondary | ICD-10-CM

## 2015-01-21 DIAGNOSIS — H1013 Acute atopic conjunctivitis, bilateral: Secondary | ICD-10-CM

## 2015-01-21 DIAGNOSIS — I10 Essential (primary) hypertension: Secondary | ICD-10-CM

## 2015-01-21 DIAGNOSIS — J309 Allergic rhinitis, unspecified: Secondary | ICD-10-CM

## 2015-01-21 MED ORDER — METHYLPREDNISOLONE ACETATE 80 MG/ML IJ SUSP
80.0000 mg | Freq: Once | INTRAMUSCULAR | Status: AC
  Administered 2015-01-21: 80 mg via INTRAMUSCULAR

## 2015-01-21 MED ORDER — FLUOCINONIDE-E 0.05 % EX CREA: 1.0000 "application " | TOPICAL_CREAM | Freq: Two times a day (BID) | CUTANEOUS | Status: DC

## 2015-01-21 MED ORDER — PREDNISONE 10 MG PO TABS: ORAL_TABLET | ORAL | Status: DC

## 2015-01-21 MED ORDER — AZELASTINE HCL 0.05 % OP SOLN: 1.0000 [drp] | Freq: Two times a day (BID) | OPHTHALMIC | Status: DC

## 2015-01-21 MED ORDER — CETIRIZINE HCL 10 MG PO TABS: 10.0000 mg | ORAL_TABLET | Freq: Every day | ORAL | Status: DC

## 2015-01-21 MED ORDER — TRIAMCINOLONE ACETONIDE 55 MCG/ACT NA AERO: 2.0000 | INHALATION_SPRAY | Freq: Every day | NASAL | Status: DC

## 2015-01-21 NOTE — Patient Instructions (Addendum)
You had the steroid shot today  Please take all new medication as prescribed - the prednisone, lidex cream, zyrtec and nasacort, as well as the optivar eye drops if needed  Please continue all other medications as before, and refills have been done if requested.  Please have the pharmacy call with any other refills you may need.  Please keep your appointments with your specialists as you may have planned

## 2015-01-21 NOTE — Progress Notes (Signed)
Pre visit review using our clinic review tool, if applicable. No additional management support is needed unless otherwise documented below in the visit note. 

## 2015-01-21 NOTE — Progress Notes (Signed)
Subjective:    Patient ID: Kristie Nelson, female    DOB: 01-06-1980, 35 y.o.   MRN: [id number]  HPI  Here to f/u , has hx of eczema now with worsening itchy areas to torso and head and neck, with rashes different sizes, nontender, no fever or drainage.  Does have several wks ongoing nasal allergy symptoms with clearish congestion, itch and sneezing, without fever, pain, ST, cough, swelling or wheezing, also with worsening eye symptoms of itching and drainage.   Pt denies fever, wt loss, night sweats, loss of appetite, or other constitutional symptoms  Pt denies chest pain, increased sob or doe, wheezing, orthopnea, PND, increased LE swelling, palpitations, dizziness or syncope. Past Medical History  Diagnosis Date  . ECZEMA 03/19/2009  . HYPERLIPIDEMIA 12/09/2009  . ANXIETY 04/03/2007  . DEPRESSION 04/03/2007  . MIGRAINE, COMMON 12/09/2009  . HYPERTENSION 04/03/2007  . ALLERGIC RHINITIS 04/03/2007  . Dysrhythmia     palpitations due to stress  . Shortness of breath     with anxiety  . Heartburn in pregnancy   . Postoperative anemia due to acute blood loss 08/30/2012   Past Surgical History  Procedure Laterality Date  . Cesarean section  2010  . Cesarean section N/A 08/29/2012    Procedure: Repeat CESAREAN SECTION;  Surgeon: Lenoard Aden, MD;  Location: [facility];  Service: Obstetrics;  Laterality: N/A;  EDD: 09/17/12;REQUEST DEE;[name]    reports that she has never smoked. She has never used smokeless tobacco. She reports that she does not drink alcohol or use illicit drugs. family history includes Diabetes in her mother; Hypertension in her other; Thyroid disease in her other. Allergies  Allergen Reactions  . Latex Itching    swelling  . Peanut-Containing Drug Products     REACTION: throat swelling  . Penicillins     REACTION: itching   Current Outpatient Prescriptions on File Prior to Visit  Medication Sig Dispense Refill  . amLODipine (NORVASC) 10 MG tablet TAKE ONE TABLET BY  MOUTH ONCE DAILY 90 tablet 1  . metoprolol succinate (TOPROL-XL) 50 MG 24 hr tablet Take 1 tablet (50 mg total) by mouth daily. 90 tablet 1  . doxycycline (VIBRA-TABS) 100 MG tablet Take 1 tablet (100 mg total) by mouth 2 (two) times daily. (Patient not taking: Reported on 01/21/2015) 20 tablet 0  . fexofenadine (ALLEGRA) 180 MG tablet Take 180 mg by mouth daily. Reported on 01/21/2015     No current facility-administered medications on file prior to visit.   Review of Systems  Constitutional: Negative for unusual diaphoresis or night sweats HENT: Negative for ringing in ear or discharge Eyes: Negative for double vision or worsening visual disturbance.  Respiratory: Negative for choking and stridor.   Gastrointestinal: Negative for vomiting or other signifcant bowel change Genitourinary: Negative for hematuria or change in urine volume.  Musculoskeletal: Negative for other MSK pain or swelling Skin: Negative for color change and worsening wound.  Neurological: Negative for tremors and numbness other than noted  Psychiatric/Behavioral: Negative for decreased concentration or agitation other than above       Objective:   Physical Exam BP 148/94 mmHg  Pulse 85  Temp(Src) 98.1 F (36.7 C) (Oral)  Ht  (1.575 m)  Wt 147 lb (66.679 kg)  BMI 26.88 kg/m2  SpO2 98% VS noted,  Constitutional: Pt appears in no significant distress HENT: Head: NCAT.  Right Ear: External ear normal.  Left Ear: External ear normal.  Eyes: . Pupils are equal,  round, and reactive to light. Conjunctivae with bilat clear d/c, and EOM are normal Neck: Normal range of motion. Neck supple.  Cardiovascular: Normal rate and regular rhythm.   Pulmonary/Chest: Effort normal and breath sounds without rales or wheezing.  Neurological: Pt is alert. Not confused , motor grossly intact Skin: Skin is warm. Numerous areas scaly rash to head and neck, torso, no LE edema Psychiatric: Pt behavior is normal. No agitation.      Assessment & Plan:

## 2015-01-29 NOTE — Assessment & Plan Note (Signed)
Mild to mod, for zyrtec asd,  to f/u any worsening symptoms or concerns 

## 2015-01-29 NOTE — Assessment & Plan Note (Signed)
Mild elev today, likely situational, o/w stable overall by history and exam, recent data reviewed with pt, and pt to continue medical treatment as before,  to f/u any worsening symptoms or concerns BP Readings from Last 3 Encounters:  01/21/15 148/94  10/17/14 150/88  08/14/14 132/92

## 2015-01-29 NOTE — Assessment & Plan Note (Addendum)
Mild to mod, for depomedrol IM, predpac asd, lidex prn, to f/u any worsening symptoms or concerns

## 2015-01-29 NOTE — Assessment & Plan Note (Signed)
Mild to mod, for otc zaditor prn and/or optivar rx,  to f/u any worsening symptoms or concerns

## 2015-03-02 ENCOUNTER — Telehealth: Payer: Self-pay | Admitting: Internal Medicine

## 2015-03-02 ENCOUNTER — Other Ambulatory Visit: Payer: Self-pay

## 2015-03-02 MED ORDER — METOPROLOL SUCCINATE ER 50 MG PO TB24: 50.0000 mg | ORAL_TABLET | Freq: Every day | ORAL | Status: DC

## 2015-03-02 NOTE — Telephone Encounter (Signed)
Pt called in for refill on her metoprolol succinate (TOPROL-XL) 50 MG 24 hr tablet [[id number]] .  She is completely out.  She stated that she called last week.

## 2015-04-06 ENCOUNTER — Other Ambulatory Visit: Payer: Self-pay | Admitting: Internal Medicine

## 2015-04-27 ENCOUNTER — Ambulatory Visit (INDEPENDENT_AMBULATORY_CARE_PROVIDER_SITE_OTHER): Payer: BC Managed Care – PPO | Admitting: Family

## 2015-04-27 ENCOUNTER — Encounter: Payer: Self-pay | Admitting: Family

## 2015-04-27 VITALS — BP 132/78 | HR 88 | Temp 98.5°F | Resp 20 | Wt 147.0 lb

## 2015-04-27 DIAGNOSIS — J029 Acute pharyngitis, unspecified: Secondary | ICD-10-CM

## 2015-04-27 LAB — POCT RAPID STREP A (OFFICE): Rapid Strep A Screen: NEGATIVE

## 2015-04-27 MED ORDER — LIDOCAINE VISCOUS 2 % MT SOLN: 15.0000 mL | OROMUCOSAL | Status: DC | PRN

## 2015-04-27 NOTE — Patient Instructions (Addendum)
Increase intake of clear fluids. Congestion is best treated by hydration, when mucus is wetter, it is thinner, less sticky, and easier to expel from the body, either through coughing up drainage, or by blowing your nose.   Get plenty of rest.   Use saline nasal drops and blow your nose frequently. Run a humidifier at night and elevate the head of the bed. Vicks Vapor rub will help with congestion and cough. Steam showers and sinus massage for congestion.   Use Acetaminophen or Ibuprofen as needed for fever or pain. Avoid second hand smoke. Even the smallest exposure will worsen symptoms.   Over the counter medications you can try include Delsym for cough, a decongestant for congestion, and Mucinex or Robitussin as an expectorant. Be sure to just get the plain Mucinex or Robitussin that just has one medication (Guaifenesen). We don't recommend the combination products. Note, be sure to drink two glasses of water with each dose of Mucinex as the medication will not work well without adequate hydration.   You can also try a teaspoon of honey to see if this will help reduce cough. Throat lozenges can sometimes be beneficial as well.    This illness will typically last 7 - 10 days.   Please follow up with our clinic if you develop a fever greater than 101 F, symptoms worsen, or do not resolve in the next week.   Sore Throat A sore throat is pain, burning, irritation, or scratchiness of the throat. There is often pain or tenderness when swallowing or talking. A sore throat may be accompanied by other symptoms, such as coughing, sneezing, fever, and swollen neck glands. A sore throat is often the first sign of another sickness, such as a cold, flu, strep throat, or mononucleosis (commonly known as mono). Most sore throats go away without medical treatment. CAUSES  The most common causes of a sore throat include:  A viral infection, such as a cold, flu, or mono.  A bacterial infection, such as strep  throat, tonsillitis, or whooping cough.  Seasonal allergies.  Dryness in the air.  Irritants, such as smoke or pollution.  Gastroesophageal reflux disease (GERD). HOME CARE INSTRUCTIONS   Only take over-the-counter medicines as directed by your caregiver.  Drink enough fluids to keep your urine clear or pale yellow.  Rest as needed.  Try using throat sprays, lozenges, or sucking on hard candy to ease any pain (if older than 4 years or as directed).  Sip warm liquids, such as broth, herbal tea, or warm water with honey to relieve pain temporarily. You may also eat or drink cold or frozen liquids such as frozen ice pops.  Gargle with salt water (mix 1 tsp salt with 8 oz of water).  Do not smoke and avoid secondhand smoke.  Put a cool-mist humidifier in your bedroom at night to moisten the air. You can also turn on a hot shower and sit in the bathroom with the door closed for 5-10 minutes. SEEK IMMEDIATE MEDICAL CARE IF:  You have difficulty breathing.  You are unable to swallow fluids, soft foods, or your saliva.  You have increased swelling in the throat.  Your sore throat does not get better in 7 days.  You have nausea and vomiting.  You have a fever or persistent symptoms for more than 2-3 days.  You have a fever and your symptoms suddenly get worse. MAKE SURE YOU:   Understand these instructions.  Will watch your condition.  Will get   help right away if you are not doing well or get worse.   This information is not intended to replace advice given to you by your health care provider. Make sure you discuss any questions you have with your health care provider.   Document Released: 01/28/2004 Document Revised: 01/10/2014 Document Reviewed: 08/28/2011 Else[employer] Interactive Patient Education 2016 [employer]  

## 2015-04-27 NOTE — Progress Notes (Signed)
Pre visit review using our clinic review tool, if applicable. No additional management support is needed unless otherwise documented below in the visit note. 

## 2015-04-27 NOTE — Progress Notes (Signed)
Subjective:    Patient ID: Kristie Nelson, female    DOB: 16-Dec-1980, 35 y.o.   MRN: [id number]   Kristie Nelson is a 35 y.o. female who presents today for an acute visit.    HPI Comments: Engineer, site.   Sore Throat  This is a new problem. The current episode started in the past 7 days. The problem has been gradually worsening. Neither side of throat is experiencing more pain than the other. There has been no fever. The pain is mild. Associated symptoms include congestion, coughing and headaches. Pertinent negatives include no ear pain, shortness of breath or vomiting. She has had no exposure to strep or mono. Treatments tried: mucinex-D severe. The treatment provided mild relief.   Past Medical History  Diagnosis Date  . ECZEMA 03/19/2009  . HYPERLIPIDEMIA 12/09/2009  . ANXIETY 04/03/2007  . DEPRESSION 04/03/2007  . MIGRAINE, COMMON 12/09/2009  . HYPERTENSION 04/03/2007  . ALLERGIC RHINITIS 04/03/2007  . Dysrhythmia     palpitations due to stress  . Shortness of breath     with anxiety  . Heartburn in pregnancy   . Postoperative anemia due to acute blood loss 08/30/2012   Latex; Peanut-containing drug products; and Penicillins Current Outpatient Prescriptions on File Prior to Visit  Medication Sig Dispense Refill  . amLODipine (NORVASC) 10 MG tablet Take 1 tablet (10 mg total) by mouth daily. Yearly physicla is due w/labs must see md for future refills 90 tablet 0  . azelastine (OPTIVAR) 0.05 % ophthalmic solution Place 1 drop into both eyes 2 (two) times daily. 6 mL 12  . cetirizine (ZYRTEC) 10 MG tablet Take 1 tablet (10 mg total) by mouth daily. 30 tablet 11  . doxycycline (VIBRA-TABS) 100 MG tablet Take 1 tablet (100 mg total) by mouth 2 (two) times daily. 20 tablet 0  . fexofenadine (ALLEGRA) 180 MG tablet Take 180 mg by mouth daily. Reported on 01/21/2015    . fluocinonide-emollient (LIDEX-E) 0.05 % cream Apply 1 application topically 2 (two) times daily. 30 g 1  . metoprolol  succinate (TOPROL-XL) 50 MG 24 hr tablet Take 1 tablet (50 mg total) by mouth daily. 90 tablet 1  . triamcinolone (NASACORT AQ) 55 MCG/ACT AERO nasal inhaler Place 2 sprays into the nose daily. 1 Inhaler 12   No current facility-administered medications on file prior to visit.    Social History  Substance Use Topics  . Smoking status: Never Smoker   . Smokeless tobacco: Never Used  . Alcohol Use: No    Review of Systems  Constitutional: Negative for fever and chills.  HENT: Positive for congestion. Negative for ear pain, sinus pressure and sore throat.   Eyes: Negative for visual disturbance.  Respiratory: Positive for cough. Negative for shortness of breath and wheezing.   Cardiovascular: Negative for chest pain, palpitations and leg swelling.  Gastrointestinal: Negative for nausea and vomiting.  Musculoskeletal: Negative for myalgias (resolved) and arthralgias.  Skin: Negative for rash.  Neurological: Positive for headaches. Light-headedness: mild.  Hematological: Negative for adenopathy.      Objective:    BP 132/78 mmHg  Pulse 88  Temp(Src) 98.5 F (36.9 C) (Oral)  Resp 20  Wt 147 lb (66.679 kg)  SpO2 96%   Physical Exam  Constitutional: She appears well-developed and well-nourished.  HENT:  Head: Normocephalic and atraumatic.  Right Ear: Hearing, tympanic membrane, external ear and ear canal normal. No drainage, swelling or tenderness. No foreign bodies. Tympanic membrane is not erythematous and  not bulging. No middle ear effusion. No decreased hearing is noted.  Left Ear: Hearing, tympanic membrane, external ear and ear canal normal. No drainage, swelling or tenderness. No foreign bodies. Tympanic membrane is not erythematous and not bulging.  No middle ear effusion. No decreased hearing is noted.  Nose: Nose normal. No rhinorrhea. Right sinus exhibits no maxillary sinus tenderness and no frontal sinus tenderness. Left sinus exhibits no maxillary sinus tenderness and  no frontal sinus tenderness.  Mouth/Throat: Uvula is midline and mucous membranes are normal. No trismus in the jaw. Posterior oropharyngeal erythema present. No oropharyngeal exudate, posterior oropharyngeal edema or tonsillar abscesses.    White exudate right tonsil. Tonsils 2/4.  Eyes: Conjunctivae are normal.  Cardiovascular: Regular rhythm, normal heart sounds and normal pulses.   Pulmonary/Chest: Effort normal and breath sounds normal. She has no wheezes. She has no rhonchi. She has no rales.  Lymphadenopathy:       Head (right side): No submental, no submandibular, no tonsillar, no preauricular, no posterior auricular and no occipital adenopathy present.       Head (left side): No submental, no submandibular, no tonsillar, no preauricular, no posterior auricular and no occipital adenopathy present.    She has no cervical adenopathy.  Neurological: She is alert.  Skin: Skin is warm and dry.  Psychiatric: She has a normal mood and affect. Her speech is normal and behavior is normal. Thought content normal.  Vitals reviewed.      Assessment & Plan:   1. Sore throat Viral.  - lidocaine (XYLOCAINE) 2 % solution; Use as directed 15 mLs in the mouth or throat every 3 (three) hours as needed for mouth pain (gargle; may spit or swallow).  Dispense: 100 mL; Refill: 0 - POCT rapid strep A-negative   I have discontinued Ms. Deis's predniSONE. I am also having her start on lidocaine. Additionally, I am having her maintain her fexofenadine, doxycycline, fluocinonide-emollient, azelastine, cetirizine, triamcinolone, metoprolol succinate, and amLODipine.   Meds ordered this encounter  Medications  . lidocaine (XYLOCAINE) 2 % solution    Sig: Use as directed 15 mLs in the mouth or throat every 3 (three) hours as needed for mouth pain (gargle; may spit or swallow).    Dispense:  100 mL    Refill:  0    Order Specific Question:  Supervising Provider    Answer:  Tresa GarterPLOTNIKOV, ALEKSEI V [[id number]]       Start medications as prescribed and explained to patient on After Visit Summary ( AVS). Risks, benefits, and alternatives of the medications and treatment plan prescribed today were discussed, and patient expressed understanding.   Education regarding symptom management and diagnosis given to patient.   Follow-up:Plan follow-up as discussed or as needed if any worsening symptoms or change in condition. No Follow-up on file.   Continue to follow with Oliver BarreJames John, MD for routine health maintenance.   Kristie Beamarla I Mcbeth and I agreed with plan.   Rennie PlowmanMargaret Thurman Sarver, FNP

## 2015-04-29 ENCOUNTER — Telehealth: Payer: Self-pay | Admitting: Internal Medicine

## 2015-04-29 ENCOUNTER — Other Ambulatory Visit: Payer: Self-pay | Admitting: Family

## 2015-04-29 DIAGNOSIS — J029 Acute pharyngitis, unspecified: Secondary | ICD-10-CM

## 2015-04-29 MED ORDER — AZITHROMYCIN 250 MG PO TABS: ORAL_TABLET | ORAL | Status: DC

## 2015-04-29 NOTE — Telephone Encounter (Signed)
Please advise 

## 2015-04-29 NOTE — Telephone Encounter (Signed)
I would not be able to make recommendation based on this, but I can forward to Phoebe PerchM Arnett FNP, thanks

## 2015-04-29 NOTE — Telephone Encounter (Signed)
Patient is requesting Dr. Jonny RuizJohn to review OV notes from 4/24 visit with Arnette.  Patient states she is not getting any better.  She would like to know what Dr. Jonny RuizJohn suggest.

## 2015-04-29 NOTE — Progress Notes (Signed)
Called patient and let her know that I sent in azithromycin. She denies any fever, sob. She states her cough has gotten worse.

## 2015-04-30 ENCOUNTER — Telehealth: Payer: Self-pay | Admitting: Internal Medicine

## 2015-04-30 NOTE — Telephone Encounter (Signed)
Relation to JX:BJYNpt:self Call back number:[phone] [facility] [address], [place] [address] [phone] (Phone) [phone] (Fax)        Reason for call:  Patient last seen Monday 04/27/15 and states cough symptoms have not improved requesting Rx. Please advise

## 2015-05-02 ENCOUNTER — Encounter: Payer: Self-pay | Admitting: Family Medicine

## 2015-05-02 ENCOUNTER — Ambulatory Visit (INDEPENDENT_AMBULATORY_CARE_PROVIDER_SITE_OTHER): Payer: BC Managed Care – PPO | Admitting: Family Medicine

## 2015-05-02 VITALS — BP 138/86 | HR 87 | Temp 98.8°F | Ht 62.0 in | Wt 148.0 lb

## 2015-05-02 DIAGNOSIS — J208 Acute bronchitis due to other specified organisms: Secondary | ICD-10-CM | POA: Diagnosis not present

## 2015-05-02 DIAGNOSIS — J069 Acute upper respiratory infection, unspecified: Secondary | ICD-10-CM | POA: Diagnosis not present

## 2015-05-02 MED ORDER — PREDNISONE 20 MG PO TABS: ORAL_TABLET | ORAL | Status: DC

## 2015-05-02 MED ORDER — BENZONATATE 100 MG PO CAPS: 200.0000 mg | ORAL_CAPSULE | Freq: Three times a day (TID) | ORAL | Status: DC | PRN

## 2015-05-02 NOTE — Progress Notes (Signed)
Pre visit review using our clinic review tool, if applicable. No additional management support is needed unless otherwise documented below in the visit note. 

## 2015-05-02 NOTE — Progress Notes (Signed)
OFFICE VISIT  05/02/2015   CC:  Chief Complaint  Patient presents with  . Cough    x 4 days OTC mucinex and delsym denies drainage or sore throat    HPI:    Patient is a 35 y.o. African-American female who presents to Saturday clinic for cough.  Onset about 4 days ago, dry.  Initially had subjective fever and ST a couple days and this resolved when the cough came. She found some Tessalon perles yesterday which helped. She was given abx when initially seen for her ST/fever (Z pack).   Past Medical History  Diagnosis Date  . ECZEMA 03/19/2009  . HYPERLIPIDEMIA 12/09/2009  . ANXIETY 04/03/2007  . DEPRESSION 04/03/2007  . MIGRAINE, COMMON 12/09/2009  . HYPERTENSION 04/03/2007  . ALLERGIC RHINITIS 04/03/2007  . Dysrhythmia     palpitations due to stress  . Shortness of breath     with anxiety  . Heartburn in pregnancy   . Postoperative anemia due to acute blood loss 08/30/2012    Past Surgical History  Procedure Laterality Date  . Cesarean section  2010  . Cesarean section N/A 08/29/2012    Procedure: Repeat CESAREAN SECTION;  Surgeon: Lenoard Adenichard J Taavon, MD;  Location: [facility];  Service: Obstetrics;  Laterality: N/A;  EDD: 09/17/12;REQUEST DEE;[name]   MEDS: not taking doxy listed below Outpatient Prescriptions Prior to Visit  Medication Sig Dispense Refill  . amLODipine (NORVASC) 10 MG tablet Take 1 tablet (10 mg total) by mouth daily. Yearly physicla is due w/labs must see md for future refills 90 tablet 0  . azelastine (OPTIVAR) 0.05 % ophthalmic solution Place 1 drop into both eyes 2 (two) times daily. 6 mL 12  . azithromycin (ZITHROMAX) 250 MG tablet Tale 500 mg PO on day 1, then 250 mg PO q24h x 4 days. 6 tablet 0  . cetirizine (ZYRTEC) 10 MG tablet Take 1 tablet (10 mg total) by mouth daily. 30 tablet 11  . doxycycline (VIBRA-TABS) 100 MG tablet Take 1 tablet (100 mg total) by mouth 2 (two) times daily. 20 tablet 0  . fexofenadine (ALLEGRA) 180 MG tablet Take 180 mg by mouth  daily. Reported on 01/21/2015    . fluocinonide-emollient (LIDEX-E) 0.05 % cream Apply 1 application topically 2 (two) times daily. 30 g 1  . lidocaine (XYLOCAINE) 2 % solution Use as directed 15 mLs in the mouth or throat every 3 (three) hours as needed for mouth pain (gargle; may spit or swallow). 100 mL 0  . metoprolol succinate (TOPROL-XL) 50 MG 24 hr tablet Take 1 tablet (50 mg total) by mouth daily. 90 tablet 1  . triamcinolone (NASACORT AQ) 55 MCG/ACT AERO nasal inhaler Place 2 sprays into the nose daily. 1 Inhaler 12   No facility-administered medications prior to visit.    Allergies  Allergen Reactions  . Latex Itching    swelling  . Peanut-Containing Drug Products     REACTION: throat swelling  . Penicillins     REACTION: itching    ROS As per HPI  PE: Blood pressure 138/86, pulse 87, temperature 98.8 F (37.1 C), temperature source Oral, height 5\' 2"  (1.575 m), weight 148 lb (67.132 kg), SpO2 99 %. VS: noted--normal. Gen: alert, NAD, NONTOXIC APPEARING. HEENT: eyes without injection, drainage, or swelling.  Ears: EACs clear, TMs with normal light reflex and landmarks.  Nose: Clear rhinorrhea, with some dried, crusty exudate adherent to mildly injected mucosa.  No purulent d/c.  No paranasal sinus TTP.  No facial  swelling.  Throat and mouth without focal lesion.  No pharyngial swelling, erythema, or exudate.   Neck: supple, no LAD.   LUNGS: CTA bilat, nonlabored resps.  Frequent postexhalation coughing.  Nonlabored resps. CV: RRR, no m/r/g. EXT: no c/c/e SKIN: no rash  LABS:  none  IMPRESSION AND PLAN:  URI, improving.  She'll continue z-pack rx'd earlier in the week. Now with acute bronchitis: no wheezing or SOB. Rx'd prednisone  qd x 5d today. Also rf'd her tessalon perles. Push fluids, rest.  An After Visit Summary was printed and given to the patient.  FOLLOW UP: Return if symptoms worsen or fail to improve.

## 2015-06-19 ENCOUNTER — Other Ambulatory Visit (INDEPENDENT_AMBULATORY_CARE_PROVIDER_SITE_OTHER): Payer: BC Managed Care – PPO

## 2015-06-19 ENCOUNTER — Encounter: Payer: Self-pay | Admitting: Internal Medicine

## 2015-06-19 ENCOUNTER — Ambulatory Visit (INDEPENDENT_AMBULATORY_CARE_PROVIDER_SITE_OTHER): Payer: BC Managed Care – PPO | Admitting: Internal Medicine

## 2015-06-19 VITALS — BP 134/76 | HR 90 | Temp 98.4°F | Resp 20 | Ht 62.0 in | Wt 149.0 lb

## 2015-06-19 DIAGNOSIS — F329 Major depressive disorder, single episode, unspecified: Secondary | ICD-10-CM

## 2015-06-19 DIAGNOSIS — Z0001 Encounter for general adult medical examination with abnormal findings: Secondary | ICD-10-CM

## 2015-06-19 DIAGNOSIS — R6889 Other general symptoms and signs: Secondary | ICD-10-CM | POA: Diagnosis not present

## 2015-06-19 DIAGNOSIS — L309 Dermatitis, unspecified: Secondary | ICD-10-CM

## 2015-06-19 DIAGNOSIS — I10 Essential (primary) hypertension: Secondary | ICD-10-CM

## 2015-06-19 DIAGNOSIS — E785 Hyperlipidemia, unspecified: Secondary | ICD-10-CM

## 2015-06-19 DIAGNOSIS — F32A Depression, unspecified: Secondary | ICD-10-CM

## 2015-06-19 LAB — URINALYSIS, ROUTINE W REFLEX MICROSCOPIC
BILIRUBIN URINE: NEGATIVE
KETONES UR: NEGATIVE
LEUKOCYTES UA: NEGATIVE
NITRITE: NEGATIVE
SPECIFIC GRAVITY, URINE: 1.015 (ref 1.000–1.030)
Total Protein, Urine: NEGATIVE
URINE GLUCOSE: NEGATIVE
Urobilinogen, UA: 0.2 (ref 0.0–1.0)
pH: 7 (ref 5.0–8.0)

## 2015-06-19 LAB — LIPID PANEL
CHOL/HDL RATIO: 6
Cholesterol: 197 mg/dL (ref 0–200)
HDL: 34.4 mg/dL — AB (ref >39.00)
LDL CALC: 132 mg/dL — AB (ref 0–99)
NONHDL: 163.03
Triglycerides: 155 mg/dL — ABNORMAL HIGH (ref 0.0–149.0)
VLDL: 31 mg/dL (ref 0.0–40.0)

## 2015-06-19 LAB — BASIC METABOLIC PANEL
BUN: 12 mg/dL (ref 6–23)
CALCIUM: 9.8 mg/dL (ref 8.4–10.5)
CO2: 29 mEq/L (ref 19–32)
CREATININE: 0.78 mg/dL (ref 0.40–1.20)
Chloride: 103 mEq/L (ref 96–112)
GFR: 106.72 mL/min (ref >60.00)
Glucose, Bld: 84 mg/dL (ref 70–99)
Potassium: 3.3 mEq/L — ABNORMAL LOW (ref 3.5–5.1)
Sodium: 139 mEq/L (ref 135–145)

## 2015-06-19 LAB — CBC WITH DIFFERENTIAL/PLATELET
BASOS PCT: 1.3 % (ref 0.0–3.0)
Basophils Absolute: 0.1 10*3/uL (ref 0.0–0.1)
EOS PCT: 2.1 % (ref 0.0–5.0)
Eosinophils Absolute: 0.2 10*3/uL (ref 0.0–0.7)
HCT: 35.6 % — ABNORMAL LOW (ref 36.0–46.0)
HEMOGLOBIN: 11.9 g/dL — AB (ref 12.0–15.0)
LYMPHS ABS: 2.5 10*3/uL (ref 0.7–4.0)
Lymphocytes Relative: 30.5 % (ref 12.0–46.0)
MCHC: 33.3 g/dL (ref 30.0–36.0)
MCV: 88.2 fl (ref 78.0–100.0)
MONO ABS: 0.6 10*3/uL (ref 0.1–1.0)
MONOS PCT: 7 % (ref 3.0–12.0)
Neutro Abs: 4.9 10*3/uL (ref 1.4–7.7)
Neutrophils Relative %: 59.1 % (ref 43.0–77.0)
Platelets: 309 10*3/uL (ref 150.0–400.0)
RBC: 4.03 Mil/uL (ref 3.87–5.11)
RDW: 13.6 % (ref 11.5–15.5)
WBC: 8.3 10*3/uL (ref 4.0–10.5)

## 2015-06-19 LAB — HEPATIC FUNCTION PANEL
ALT: 7 U/L (ref 0–35)
AST: 12 U/L (ref 0–37)
Albumin: 4.3 g/dL (ref 3.5–5.2)
Alkaline Phosphatase: 61 U/L (ref 39–117)
BILIRUBIN DIRECT: 0 mg/dL (ref 0.0–0.3)
BILIRUBIN TOTAL: 0.3 mg/dL (ref 0.2–1.2)
TOTAL PROTEIN: 7.5 g/dL (ref 6.0–8.3)

## 2015-06-19 LAB — TSH: TSH: 0.8 u[IU]/mL (ref 0.35–4.50)

## 2015-06-19 MED ORDER — TRIAMCINOLONE ACETONIDE 0.1 % EX CREA: 1.0000 "application " | TOPICAL_CREAM | Freq: Two times a day (BID) | CUTANEOUS | Status: DC

## 2015-06-19 NOTE — Progress Notes (Signed)
Subjective:    Patient ID: Kristie Nelson, female    DOB: 06-10-1980, 35 y.o.   MRN: [id number]  HPI  Here for wellness and f/u;  Overall doing ok;  Pt denies Chest pain, worsening SOB, DOE, wheezing, orthopnea, PND, worsening LE edema, palpitations, dizziness or syncope.  Pt denies neurological change such as new headache, facial or extremity weakness.  Pt denies polydipsia, polyuria, or low sugar symptoms. Pt states overall good compliance with treatment and medications, good tolerability, and has been trying to follow Pt states good ability with ADL's, has low fall risk, home safety reviewed and adequate, no other significant changes in hearing or vision, and only occasionally active with exercise. Plans to see GYN for pap soon.   Wt Readings from Last 3 Encounters:  06/19/15 149 lb (67.586 kg)  05/02/15 148 lb (67.132 kg)  04/27/15 147 lb (66.679 kg)   BP Readings from Last 3 Encounters:  06/19/15 134/76  05/02/15 138/86  04/27/15 132/78   Also has rash to neck and arms, better somewhat with last tx here last month , but lidex not working so well, Still very itchy, mild to mod, persistent for months , no pain, fever.   Denies worsening depressive symptoms, suicidal ideation, or panic Past Medical History  Diagnosis Date  . ECZEMA 03/19/2009  . HYPERLIPIDEMIA 12/09/2009  . ANXIETY 04/03/2007  . DEPRESSION 04/03/2007  . MIGRAINE, COMMON 12/09/2009  . HYPERTENSION 04/03/2007  . ALLERGIC RHINITIS 04/03/2007  . Dysrhythmia     palpitations due to stress  . Shortness of breath     with anxiety  . Heartburn in pregnancy   . Postoperative anemia due to acute blood loss 08/30/2012   Past Surgical History  Procedure Laterality Date  . Cesarean section  2010  . Cesarean section N/A 08/29/2012    Procedure: Repeat CESAREAN SECTION;  Surgeon: Lenoard Aden, MD;  Location: [facility];  Service: Obstetrics;  Laterality: N/A;  EDD: 09/17/12;REQUEST DEE;[name]    reports that she has never smoked.  She has never used smokeless tobacco. She reports that she does not drink alcohol or use illicit drugs. family history includes Diabetes in her mother; Hypertension in her other; Thyroid disease in her other. Allergies  Allergen Reactions  . Latex Itching    swelling  . Peanut-Containing Drug Products     REACTION: throat swelling  . Penicillins     REACTION: itching   Current Outpatient Prescriptions on File Prior to Visit  Medication Sig Dispense Refill  . amLODipine (NORVASC) 10 MG tablet Take 1 tablet (10 mg total) by mouth daily. Yearly physicla is due w/labs must see md for future refills 90 tablet 0  . azelastine (OPTIVAR) 0.05 % ophthalmic solution Place 1 drop into both eyes 2 (two) times daily. 6 mL 12  . benzonatate (TESSALON) 100 MG capsule Take 2 capsules (200 mg total) by mouth 3 (three) times daily as needed for cough. 30 capsule 0  . cetirizine (ZYRTEC) 10 MG tablet Take 1 tablet (10 mg total) by mouth daily. 30 tablet 11  . fexofenadine (ALLEGRA) 180 MG tablet Take 180 mg by mouth daily. Reported on 01/21/2015    . lidocaine (XYLOCAINE) 2 % solution Use as directed 15 mLs in the mouth or throat every 3 (three) hours as needed for mouth pain (gargle; may spit or swallow). 100 mL 0  . metoprolol succinate (TOPROL-XL) 50 MG 24 hr tablet Take 1 tablet (50 mg total) by mouth daily. 90 tablet  1  . triamcinolone (NASACORT AQ) 55 MCG/ACT AERO nasal inhaler Place 2 sprays into the nose daily. 1 Inhaler 12   No current facility-administered medications on file prior to visit.   Review of Systems Constitutional: Negative for increased diaphoresis, or other activity, appetite or siginficant weight change other than noted HENT: Negative for worsening hearing loss, ear pain, facial swelling, mouth sores and neck stiffness.   Eyes: Negative for other worsening pain, redness or visual disturbance.  Respiratory: Negative for choking or stridor Cardiovascular: Negative for other chest pain  and palpitations.  Gastrointestinal: Negative for worsening diarrhea, blood in stool, or abdominal distention Genitourinary: Negative for hematuria, flank pain or change in urine volume.  Musculoskeletal: Negative for myalgias or other joint complaints.  Skin: Negative for other color change and wound or drainage.  Neurological: Negative for syncope and numbness. other than noted Hematological: Negative for adenopathy. or other swelling Psychiatric/Behavioral: Negative for hallucinations, SI, self-injury, decreased concentration or other worsening agitation.      Objective:   Physical Exam BP 134/76 mmHg  Pulse 90  Temp(Src) 98.4 F (36.9 C) (Oral)  Resp 20  Ht 5\' 2"  (1.575 m)  Wt 149 lb (67.586 kg)  BMI 27.25 kg/m2  SpO2 98% VS noted,  Constitutional: Pt is oriented to person, place, and time. Appears well-developed and well-nourished, in no significant distress Head: Normocephalic and atraumatic  Eyes: Conjunctivae and EOM are normal. Pupils are equal, round, and reactive to light Right Ear: External ear normal.  Left Ear: External ear normal Nose: Nose normal.  Mouth/Throat: Oropharynx is clear and moist  Neck: Normal range of motion. Neck supple. No JVD present. No tracheal deviation present or significant neck LA or mass Cardiovascular: Normal rate, regular rhythm, normal heart sounds and intact distal pulses.   Pulmonary/Chest: Effort normal and breath sounds without rales or wheezing  Abdominal: Soft. Bowel sounds are normal. NT. No HSM  Musculoskeletal: Normal range of motion. Exhibits no edema Lymphadenopathy: Has no cervical adenopathy.  Neurological: Pt is alert and oriented to person, place, and time. Pt has normal reflexes. No cranial nerve deficit. Motor grossly intact Skin: Skin is warm and dry. + eczematous scaly itchy rashes up to 1.5 cm areas to bilat neck and arms below the elbows noted, no new ulcers Psychiatric:  Has mild nervous mood and affect. Behavior is  normal., not depressed affect    Assessment & Plan:

## 2015-06-19 NOTE — Assessment & Plan Note (Signed)

## 2015-06-19 NOTE — Progress Notes (Signed)
Pre visit review using our clinic review tool, if applicable. No additional management support is needed unless otherwise documented below in the visit note. 

## 2015-06-19 NOTE — Assessment & Plan Note (Signed)
stable overall by history and exam, recent data reviewed with pt, and pt to continue medical treatment as before,  to f/u any worsening symptoms or concerns Lab Results  Component Value Date   WBC 6.3 03/26/2013   HGB 12.4 03/26/2013   HCT 37.5 03/26/2013   PLT 289.0 03/26/2013   GLUCOSE 72 03/26/2013   CHOL 199 03/26/2013   TRIG 82.0 03/26/2013   HDL 44.30 03/26/2013   LDLDIRECT 109.3 10/17/2007   LDLCALC 138* 03/26/2013   ALT 14 03/26/2013   AST 20 03/26/2013   NA 136 03/26/2013   K 2.8* 03/26/2013   CL 99 03/26/2013   CREATININE 0.7 03/26/2013   BUN 12 03/26/2013   CO2 30 03/26/2013   TSH 0.77 03/26/2013

## 2015-06-19 NOTE — Assessment & Plan Note (Signed)
stable overall by history and exam, recent data reviewed with pt, and pt to continue medical treatment as before,  to f/u any worsening symptoms or concerns BP Readings from Last 3 Encounters:  06/19/15 134/76  05/02/15 138/86  04/27/15 132/78

## 2015-06-19 NOTE — Assessment & Plan Note (Signed)
stable overall by history and exam, recent data reviewed with pt, and pt to continue medical treatment as before,  to f/u any worsening symptoms or concerns Lab Results  Component Value Date   LDLCALC 138* 03/26/2013   For lower chol diet, has strong FH elev chol but no premature cad, consider statin if not improved

## 2015-06-19 NOTE — Assessment & Plan Note (Signed)
Mild to mod persistent, for triam cr, and refer dermatology,  to f/u any worsening symptoms or concerns

## 2015-06-19 NOTE — Patient Instructions (Signed)
Please take all new medication as prescribed - the cream  Please continue all other medications as before, and refills have been done if requested.  Please have the pharmacy call with any other refills you may need.  Please continue your efforts at being more active, low cholesterol diet, and weight control.  You are otherwise up to date with prevention measures today.  Please keep your appointments with your specialists as you may have planned  You will be contacted regarding the referral for: dermatology  Please go to the LAB in the Basement (turn left off the elevator) for the tests to be done today  You will be contacted by phone if any changes need to be made immediately.  Otherwise, you will receive a letter about your results with an explanation, but please check with MyChart first.  Please remember to sign up for MyChart if you have not done so, as this will be important to you in the future with finding out test results, communicating by private email, and scheduling acute appointments online when needed.  Please return in 1 year for your yearly visit, or sooner if needed, with Lab testing done 3-5 days before

## 2015-07-10 ENCOUNTER — Other Ambulatory Visit: Payer: Self-pay | Admitting: Internal Medicine

## 2015-09-01 ENCOUNTER — Other Ambulatory Visit: Payer: Self-pay | Admitting: Internal Medicine

## 2015-09-14 ENCOUNTER — Ambulatory Visit (INDEPENDENT_AMBULATORY_CARE_PROVIDER_SITE_OTHER): Payer: BC Managed Care – PPO | Admitting: Internal Medicine

## 2015-09-14 ENCOUNTER — Encounter: Payer: Self-pay | Admitting: Internal Medicine

## 2015-09-14 DIAGNOSIS — J029 Acute pharyngitis, unspecified: Secondary | ICD-10-CM | POA: Diagnosis not present

## 2015-09-14 DIAGNOSIS — M545 Low back pain, unspecified: Secondary | ICD-10-CM | POA: Insufficient documentation

## 2015-09-14 DIAGNOSIS — M5441 Lumbago with sciatica, right side: Secondary | ICD-10-CM | POA: Diagnosis not present

## 2015-09-14 MED ORDER — DICLOFENAC SODIUM 75 MG PO TBEC: 75.0000 mg | DELAYED_RELEASE_TABLET | Freq: Two times a day (BID) | ORAL | 0 refills | Status: DC

## 2015-09-14 MED ORDER — AZITHROMYCIN 250 MG PO TABS: ORAL_TABLET | ORAL | 0 refills | Status: DC

## 2015-09-14 NOTE — Progress Notes (Signed)
   Subjective:    Patient ID: Kristie Nelson, female    DOB: 1980/05/08, 35 y.o.   MRN: [id number]  HPI The patient is a 35 YO female coming in for sore throat and fever. Going on for about 1 day. 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 The sore throat is hurting and she is having fevers. Denies cough. Has not tried anything for it but gargling. No real nasal drainage or sinus problems. No ear pain or discharge. Noted pcn allergy.  The back pain worse with sitting up or squatting at school. She has tried tylenol which works for a couple of hours.   Review of Systems  Constitutional: Positive for activity change, appetite change, chills and fever. Negative for unexpected weight change.  HENT: Positive for sore throat. Negative for congestion, ear discharge, ear pain, postnasal drip, rhinorrhea, sinus pressure and trouble swallowing.   Eyes: Negative.   Respiratory: Negative for cough, chest tightness, shortness of breath and wheezing.   Cardiovascular: Negative.   Gastrointestinal: Negative.   Musculoskeletal: Positive for arthralgias, back pain and myalgias.  Neurological: Negative.       Objective:   Physical Exam  Constitutional: She appears well-developed and well-nourished. She appears distressed.  Mild discomfort  HENT:  Head: Normocephalic and atraumatic.  Right Ear: External ear normal.  Left Ear: External ear normal.  Tonsillar exudate with white crusting, nose without crusting.   Eyes: EOM are normal.  Neck: Normal range of motion.  Some shotty LAD on exam cervical.   Cardiovascular: Normal rate and regular rhythm.   Pulmonary/Chest: Effort normal and breath sounds normal. No respiratory distress. She has no wheezes.  Abdominal: Soft.  Musculoskeletal: She exhibits tenderness.  Some pain lumbar right paraspinal muscles.   Lymphadenopathy:    She has cervical adenopathy.   Vitals:   09/14/15 1111  09/14/15 1149  BP: (!) 180/100 (!) 176/96  Pulse: (!) 105   Resp: 18   Temp: (!) 101.7 F (38.7 C)   TempSrc: Oral   SpO2: 98%   Weight: 147 lb (66.7 kg)   Height: 5\' 2"  (1.575 m)       Assessment & Plan:

## 2015-09-14 NOTE — Assessment & Plan Note (Signed)
Rx for voltaren to help with the pain. Likely muscular in nature.

## 2015-09-14 NOTE — Progress Notes (Signed)
Pre visit review using our clinic review tool, if applicable. No additional management support is needed unless otherwise documented below in the visit note. 

## 2015-09-14 NOTE — Assessment & Plan Note (Addendum)
Rapid strep test unable to be done so will empirically treat with z-pack for treatment given pcn allergy. Advised she is not allowed to return to school until fever free for 24 hours.

## 2015-09-14 NOTE — Patient Instructions (Signed)
We are sending in azithromycin for the sore throat since we are out of the strep tests. Take 2 pills today, then 1 pill daily for days 2-5.   You are infectious until you are fever free for 24 hours and should not go to work today.  We have sent in voltaren for the back pain which you can take 1 pill twice a day for pain.    Strep Throat Strep throat is a bacterial infection of the throat. Your health care provider may call the infection tonsillitis or pharyngitis, depending on whether there is swelling in the tonsils or at the back of the throat. Strep throat is most common during the cold months of the year in children who are 595-37 years of age, but it can happen during any season in people of any age. This infection is spread from person to person (contagious) through coughing, sneezing, or close contact. CAUSES Strep throat is caused by the bacteria called Streptococcus pyogenes. RISK FACTORS This condition is more likely to develop in:  People who spend time in crowded places where the infection can spread easily.  People who have close contact with someone who has strep throat. SYMPTOMS Symptoms of this condition include:  Fever or chills.   Redness, swelling, or pain in the tonsils or throat.  Pain or difficulty when swallowing.  White or yellow spots on the tonsils or throat.  Swollen, tender glands in the neck or under the jaw.  Red rash all over the body (rare). DIAGNOSIS This condition is diagnosed by performing a rapid strep test or by taking a swab of your throat (throat culture test). Results from a rapid strep test are usually ready in a few minutes, but throat culture test results are available after one or two days. TREATMENT This condition is treated with antibiotic medicine. HOME CARE INSTRUCTIONS Medicines  Take over-the-counter and prescription medicines only as told by your health care provider.  Take your antibiotic as told by your health care provider.  Do not stop taking the antibiotic even if you start to feel better.  Have family members who also have a sore throat or fever tested for strep throat. They may need antibiotics if they have the strep infection. Eating and Drinking  Do not share food, drinking cups, or personal items that could cause the infection to spread to other people.  If swallowing is difficult, try eating soft foods until your sore throat feels better.  Drink enough fluid to keep your urine clear or pale yellow. General Instructions  Gargle with a salt-water mixture 3-4 times per day or as needed. To make a salt-water mixture, completely dissolve -1 tsp of salt in 1 cup of warm water.  Make sure that all household members wash their hands well.  Get plenty of rest.  Stay home from school or work until you have been taking antibiotics for 24 hours.  Keep all follow-up visits as told by your health care provider. This is important. SEEK MEDICAL CARE IF:  The glands in your neck continue to get bigger.  You develop a rash, cough, or earache.  You cough up a thick liquid that is green, yellow-brown, or bloody.  You have pain or discomfort that does not get better with medicine.  Your problems seem to be getting worse rather than better.  You have a fever. SEEK IMMEDIATE MEDICAL CARE IF:  You have new symptoms, such as vomiting, severe headache, stiff or painful neck, chest pain, or shortness  of breath.  You have severe throat pain, drooling, or changes in your voice.  You have swelling of the neck, or the skin on the neck becomes red and tender.  You have signs of dehydration, such as fatigue, dry mouth, and decreased urination.  You become increasingly sleepy, or you cannot wake up completely.  Your joints become red or painful.   This information is not intended to replace advice given to you by your health care provider. Make sure you discuss any questions you have with your health care  provider.   Document Released: 12/18/1999 Document Revised: 09/10/2014 Document Reviewed: 04/14/2014 Elsevier Interactive Patient Education [employer]

## 2015-10-03 ENCOUNTER — Other Ambulatory Visit: Payer: Self-pay | Admitting: Internal Medicine

## 2015-10-19 ENCOUNTER — Ambulatory Visit (INDEPENDENT_AMBULATORY_CARE_PROVIDER_SITE_OTHER): Payer: BC Managed Care – PPO | Admitting: Primary Care

## 2015-10-19 ENCOUNTER — Encounter: Payer: Self-pay | Admitting: Primary Care

## 2015-10-19 ENCOUNTER — Telehealth: Payer: Self-pay | Admitting: Internal Medicine

## 2015-10-19 VITALS — BP 136/80 | HR 69 | Temp 97.7°F | Ht 62.0 in | Wt 146.0 lb

## 2015-10-19 DIAGNOSIS — K649 Unspecified hemorrhoids: Secondary | ICD-10-CM | POA: Diagnosis not present

## 2015-10-19 LAB — IFOBT (OCCULT BLOOD): IMMUNOLOGICAL FECAL OCCULT BLOOD TEST: NEGATIVE

## 2015-10-19 MED ORDER — HYDROCORTISONE ACETATE 25 MG RE SUPP: 25.0000 mg | Freq: Two times a day (BID) | RECTAL | 0 refills | Status: DC

## 2015-10-19 NOTE — Progress Notes (Signed)
Pre visit review using our clinic review tool, if applicable. No additional management support is needed unless otherwise documented below in the visit note. 

## 2015-10-19 NOTE — Assessment & Plan Note (Signed)
Chronic for years and reoccur monthly. Exam today with stable internal and external hemorrhoids. Rx for Anusol suppositories sent to pharmacy. Discussed importance of high fiber diet, sufficient water intake, and exercise. Recommended she discuss recurrent hemorrhoids with PCP given that they are monthly and painful. Return precautions provided.

## 2015-10-19 NOTE — Progress Notes (Signed)
Subjective:    Patient ID: Kristie Nelson, female    DOB: 02-28-1980, 35 y.o.   MRN: [id number]  HPI  Kristie Nelson is a 35 year old female with a history of hemorrhoids who presents today with a chief complaint of rectal pain. She's experienced hemorrhoids which since her first pregnancy 6-7 years ago and became worse within the past several years.    She will experience monthly flares of rectal pain with internal and external hemorrhoids without bleeding. Her recent episode began around October 11th. She's experiencing rectal pain without bleeding. She has been treated in the past with hemorrhoid cream and suppositiroes with improvement. She has used an old prescription for Anusol cream for her monthly flares without recent improvement.   She has bowel movements 2-3 days which will be large in size and firm. She's recently been trying to eat a healthier diet as her cholesterol is elevated, but does not regularly focus on fiber intake.  Her diet currently consists of: Breakfast: Orange juice Lunch: Left overs Dinner: Meat, vegetables (greens, broccoli, spinach, green beans) Snacks: Chips, crackers, candy bars Desserts: Daily Beverages: Water (4-5 bottles daily)  She denies fatigue, weakness, bleeding, unexplained weight loss, abdominal pain.  Review of Systems  Constitutional: Negative for fatigue, fever and unexpected weight change.  Gastrointestinal: Positive for rectal pain. Negative for abdominal pain, blood in stool, constipation, diarrhea and nausea.  Neurological: Negative for weakness.       Past Medical History:  Diagnosis Date  . ALLERGIC RHINITIS 04/03/2007  . ANXIETY 04/03/2007  . DEPRESSION 04/03/2007  . Dysrhythmia    palpitations due to stress  . ECZEMA 03/19/2009  . Heartburn in pregnancy   . HYPERLIPIDEMIA 12/09/2009  . HYPERTENSION 04/03/2007  . MIGRAINE, COMMON 12/09/2009  . Postoperative anemia due to acute blood loss 08/30/2012  . Shortness of breath    with  anxiety     Social History   Social History  . Marital status: Married    Spouse name: N/A  . Number of children: 1  . Years of education: N/A   Occupational History  . TEACHER [employer]   Social History Main Topics  . Smoking status: Never Smoker  . Smokeless tobacco: Never Used  . Alcohol use No  . Drug use: No  . Sexual activity: Not on file   Other Topics Concern  . 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 CESAREAN SECTION  2010  . CESAREAN SECTION N/A 08/29/2012   Procedure: Repeat CESAREAN SECTION;  Surgeon: Lenoard Aden, MD;  Location: [facility];  Service: Obstetrics;  Laterality: N/A;  EDD: 09/17/12;REQUEST DEE;[name]    Family History  Problem Relation Age of Onset  . Diabetes Mother   . Hypertension Other   . Thyroid disease Other     hypothyroidism    Allergies  Allergen Reactions  . Latex Itching    swelling  . Peanut-Containing Drug Products     REACTION: throat swelling  . Penicillins     REACTION: itching    Current Outpatient Prescriptions on File Prior to Visit  Medication Sig Dispense Refill  . amLODipine (NORVASC) 10 MG tablet Take 1 tablet (10 mg total) by mouth daily. 90 tablet 1  . metoprolol succinate (TOPROL-XL) 50 MG 24 hr tablet TAKE ONE TABLET BY MOUTH ONCE DAILY 90 tablet 1  . triamcinolone cream (KENALOG) 0.1 % Apply 1 application  topically 2 (two) times daily. 30 g 1  . azelastine (OPTIVAR) 0.05 % ophthalmic solution Place 1 drop into both eyes 2 (two) times daily. (Patient not taking: Reported on 10/19/2015) 6 mL 12  . cetirizine (ZYRTEC) 10 MG tablet Take 1 tablet (10 mg total) by mouth daily. (Patient not taking: Reported on 10/19/2015) 30 tablet 11  . diclofenac (VOLTAREN) 75 MG EC tablet Take 1 tablet (75 mg total) by mouth 2 (two) times daily. (Patient not taking: Reported on 10/19/2015) 60 tablet 0  . fexofenadine (ALLEGRA) 180 MG tablet  Take 180 mg by mouth daily. Reported on 01/21/2015    . lidocaine (XYLOCAINE) 2 % solution Use as directed 15 mLs in the mouth or throat every 3 (three) hours as needed for mouth pain (gargle; may spit or swallow). (Patient not taking: Reported on 10/19/2015) 100 mL 0  . triamcinolone (NASACORT AQ) 55 MCG/ACT AERO nasal inhaler Place 2 sprays into the nose daily. (Patient not taking: Reported on 10/19/2015) 1 Inhaler 12   No current facility-administered medications on file prior to visit.     BP 136/80   Pulse 69   Temp 97.7 F (36.5 C) (Oral)   Ht 5\' 2"  (1.575 m)   Wt 146 lb (66.2 kg)   LMP 10/04/2015   SpO2 99%   BMI 26.70 kg/m    Objective:   Physical Exam  Constitutional: She appears well-nourished.  Cardiovascular: Normal rate and regular rhythm.   Pulmonary/Chest: Effort normal and breath sounds normal.  Genitourinary: Rectal exam shows external hemorrhoid, internal hemorrhoid and tenderness. Rectal exam shows anal tone normal and guaiac negative stool.  Genitourinary Comments: 3 small external hemorrhoids, 1 internal hemorrhoid. Non thrombosed.  Skin: Skin is warm and dry.          Assessment & Plan:

## 2015-10-19 NOTE — Telephone Encounter (Signed)
Patient Name: Kristie Nelson  DOB: May 15, 1980    Initial Comment Caller states, she is having Hemmrhoids she has been trying to treat ot but it is worse.   Nurse Assessment  Nurse: Leveda AnnaHensel, RN, Kristie Nelson Date/Time (Eastern Time): 10/19/2015 8:55:22 AM  Confirm and document reason for call. If symptomatic, describe symptoms. You must click the next button to save text entered. ---Caller states, she is having Hemorrhoids she has been trying to treat ot but it is worse. Caller states, no blood, it is very painful very difficult to sit sometimes to walk.  Has the patient traveled out of the country within the last 30 days? ---Not Applicable  Does the patient have any new or worsening symptoms? ---Yes  Will a triage be completed? ---Yes  Related visit to physician within the last 2 weeks? ---No  Does the PT have any chronic conditions? (i.e. diabetes, asthma, etc.) ---Yes  List chronic conditions. ---htn  Is the patient pregnant or possibly pregnant? (Ask all females between the ages of 6912-55) ---No  Is this a behavioral health or substance abuse call? ---No     Guidelines    Guideline Title Affirmed Question Affirmed Notes  Rectal Symptoms SEVERE rectal pain (e.g., excruciating, unable to have a bowel movement)    Final Disposition User   See Physician within 4 Hours (or PCP triage) Hensel, RN, Kristie Nelson    Comments  Caller states, she can not make a 4 hour appt. She has to work until 330 nurse reiterated disposition. Caller would still like an appt. [facility] since nurse was unable to schedule an appt at [place] office.   Referrals  GO TO FACILITY OTHER - SPECIFY   Disagree/Comply: Disagree  Disagree/Comply Reason: Disagree with instructions

## 2015-10-19 NOTE — Patient Instructions (Signed)
Insert 1 suppository into the rectum twice daily.   Ensure you are eating enough fiber and drinking enough water in order to prevent constipation which can cause/aggravate hemorrhoids.  Ensure you are consuming 64 ounces of water daily.  I recommend you discuss your recurrent hemorrhoids with Dr. Jonny RuizJohn.  Please call me if no improvement in 3-4 days.  It was a pleasure meeting you!  High-Fiber Diet Fiber, also called dietary fiber, is a type of carbohydrate found in fruits, vegetables, whole grains, and beans. A high-fiber diet can have many health benefits. Your health care provider may recommend a high-fiber diet to help:  Prevent constipation. Fiber can make your bowel movements more regular.  Lower your cholesterol.  Relieve hemorrhoids, uncomplicated diverticulosis, or irritable bowel syndrome.  Prevent overeating as part of a weight-loss plan.  Prevent heart disease, type 2 diabetes, and certain cancers. WHAT IS MY PLAN? The recommended daily intake of fiber includes:  38 grams for men under age 37.  30 grams for men over age 37.  25 grams for women under age 37.  21 grams for women over age 37. You can get the recommended daily intake of dietary fiber by eating a variety of fruits, vegetables, grains, and beans. Your health care provider may also recommend a fiber supplement if it is not possible to get enough fiber through your diet. WHAT DO I NEED TO KNOW ABOUT A HIGH-FIBER DIET?  Fiber supplements have not been widely studied for their effectiveness, so it is better to get fiber through food sources.  Always check the fiber content on thenutrition facts label of any prepackaged food. Look for foods that contain at least 5 grams of fiber per serving.  Ask your dietitian if you have questions about specific foods that are related to your condition, especially if those foods are not listed in the following section.  Increase your daily fiber consumption gradually.  Increasing your intake of dietary fiber too quickly may cause bloating, cramping, or gas.  Drink plenty of water. Water helps you to digest fiber. WHAT FOODS CAN I EAT? Grains Whole-grain breads. Multigrain cereal. Oats and oatmeal. Brown rice. Barley. Bulgur wheat. Millet. Bran muffins. Popcorn. Rye wafer crackers. Vegetables Sweet potatoes. Spinach. Kale. Artichokes. Cabbage. Broccoli. Green peas. Carrots. Squash. Fruits Berries. Pears. Apples. Oranges. Avocados. Prunes and raisins. Dried figs. Meats and Other Protein Sources Navy, kidney, pinto, and soy beans. Split peas. Lentils. Nuts and seeds. Dairy Fiber-fortified yogurt. Beverages Fiber-fortified soy milk. Fiber-fortified orange juice. Other Fiber bars. The items listed above may not be a complete list of recommended foods or beverages. Contact your dietitian for more options. WHAT FOODS ARE NOT RECOMMENDED? Grains White bread. Pasta made with refined flour. White rice. Vegetables Fried potatoes. Canned vegetables. Well-cooked vegetables.  Fruits Fruit juice. Cooked, strained fruit. Meats and Other Protein Sources Fatty cuts of meat. Fried Environmental education officerpoultry or fried fish. Dairy Milk. Yogurt. Cream cheese. Sour cream. Beverages Soft drinks. Other Cakes and pastries. Butter and oils. The items listed above may not be a complete list of foods and beverages to avoid. Contact your dietitian for more information. WHAT ARE SOME TIPS FOR INCLUDING HIGH-FIBER FOODS IN MY DIET?  Eat a wide variety of high-fiber foods.  Make sure that half of all grains consumed each day are whole grains.  Replace breads and cereals made from refined flour or white flour with whole-grain breads and cereals.  Replace white rice with brown rice, bulgur wheat, or millet.  Start the day  with a breakfast that is high in fiber, such as a cereal that contains at least 5 grams of fiber per serving.  Use beans in place of meat in soups, salads, or  pasta.  Eat high-fiber snacks, such as berries, raw vegetables, nuts, or popcorn.   This information is not intended to replace advice given to you by your health care provider. Make sure you discuss any questions you have with your health care provider.   Document Released: 12/20/2004 Document Revised: 01/10/2014 Document Reviewed: 06/04/2013 Elsevier Interactive Patient Education [employer]

## 2015-10-27 ENCOUNTER — Encounter: Payer: Self-pay | Admitting: Internal Medicine

## 2015-10-27 ENCOUNTER — Ambulatory Visit (INDEPENDENT_AMBULATORY_CARE_PROVIDER_SITE_OTHER): Payer: BC Managed Care – PPO | Admitting: Internal Medicine

## 2015-10-27 VITALS — BP 138/80 | HR 79 | Temp 98.2°F | Resp 20 | Wt 146.1 lb

## 2015-10-27 DIAGNOSIS — K649 Unspecified hemorrhoids: Secondary | ICD-10-CM

## 2015-10-27 DIAGNOSIS — Z23 Encounter for immunization: Secondary | ICD-10-CM | POA: Diagnosis not present

## 2015-10-27 DIAGNOSIS — I10 Essential (primary) hypertension: Secondary | ICD-10-CM

## 2015-10-27 MED ORDER — LIDOCAINE-HYDROCORTISONE ACE 3-0.5 % RE CREA: 1.0000 | TOPICAL_CREAM | Freq: Two times a day (BID) | RECTAL | 1 refills | Status: DC | PRN

## 2015-10-27 NOTE — Patient Instructions (Addendum)
You had the flu shot today  .Please take all new medication as prescribed - the [facility] for pain and swelling  Please continue all other medications as before, and refills have been done if requested.  Please have the pharmacy call with any other refills you may need.  Please keep your appointments with your specialists as you may have planned  You will be contacted regarding the referral for: Gastroenterology

## 2015-10-27 NOTE — Progress Notes (Signed)
Pre visit review using our clinic review tool, if applicable. No additional management support is needed unless otherwise documented below in the visit note. 

## 2015-10-27 NOTE — Progress Notes (Signed)
Subjective:    Patient ID: Crystallynn I Nazir, femaleMelvenia Beam    DOB: December 27, 1980, 35 y.o.   MRN: [id number]  HPI  Here to f/u, unfortuantely has persistent symtpomatic hemorrhoids without BRB but persistent pain, Denies worsening reflux, other abd pain, dysphagia, n/v, bowel change or blood.  No fever, swelling. Pt denies chest pain, increased sob or doe, wheezing, orthopnea, PND, increased LE swelling, palpitations, dizziness or syncope.   Past Medical History:  Diagnosis Date  . ALLERGIC RHINITIS 04/03/2007  . ANXIETY 04/03/2007  . DEPRESSION 04/03/2007  . Dysrhythmia    palpitations due to stress  . ECZEMA 03/19/2009  . Heartburn in pregnancy   . HYPERLIPIDEMIA 12/09/2009  . HYPERTENSION 04/03/2007  . MIGRAINE, COMMON 12/09/2009  . Postoperative anemia due to acute blood loss 08/30/2012  . Shortness of breath    with anxiety   Past Surgical History:  Procedure Laterality Date  . CESAREAN SECTION  2010  . CESAREAN SECTION N/A 08/29/2012   Procedure: Repeat CESAREAN SECTION;  Surgeon: Lenoard Adenichard J Taavon, MD;  Location: [facility];  Service: Obstetrics;  Laterality: N/A;  EDD: 09/17/12;REQUEST DEE;[name]    reports that she has never smoked. She has never used smokeless tobacco. She reports that she does not drink alcohol or use drugs. family history includes Diabetes in her mother; Hypertension in her other; Thyroid disease in her other. Allergies  Allergen Reactions  . Latex Itching    swelling  . Peanut-Containing Drug Products     REACTION: throat swelling  . Penicillins     REACTION: itching   Current Outpatient Prescriptions on File Prior to Visit  Medication Sig Dispense Refill  . amLODipine (NORVASC) 10 MG tablet Take 1 tablet (10 mg total) by mouth daily. 90 tablet 1  . azelastine (OPTIVAR) 0.05 % ophthalmic solution Place 1 drop into both eyes 2 (two) times daily. 6 mL 12  . cetirizine (ZYRTEC) 10 MG tablet Take 1 tablet (10 mg total) by mouth daily. 30 tablet 11  . diclofenac (VOLTAREN)  75 MG EC tablet Take 1 tablet (75 mg total) by mouth 2 (two) times daily. 60 tablet 0  . fexofenadine (ALLEGRA) 180 MG tablet Take 180 mg by mouth daily. Reported on 01/21/2015    . hydrocortisone (ANUSOL-HC) 25 MG suppository Place 1 suppository (25 mg total) rectally 2 (two) times daily. 12 suppository 0  . metoprolol succinate (TOPROL-XL) 50 MG 24 hr tablet TAKE ONE TABLET BY MOUTH ONCE DAILY 90 tablet 1  . triamcinolone (NASACORT AQ) 55 MCG/ACT AERO nasal inhaler Place 2 sprays into the nose daily. 1 Inhaler 12  . triamcinolone cream (KENALOG) 0.1 % Apply 1 application topically 2 (two) times daily. 30 g 1   No current facility-administered medications on file prior to visit.    Review of Systems All otherwise neg per pt    Objective:   Physical Exam BP 138/80   Pulse 79   Temp 98.2 F (36.8 C) (Oral)   Resp 20   Wt 146 lb 2 oz (66.3 kg)   LMP 10/04/2015   SpO2 98%   BMI 26.73 kg/m  VS noted,  Constitutional: Pt appears in no apparent distress HENT: Head: NCAT.  Right Ear: External ear normal.  Left Ear: External ear normal.  Eyes: . Pupils are equal, round, and reactive to light. Conjunctivae and EOM are normal Neck: Normal range of motion. Neck supple.  Cardiovascular: Normal rate and regular rhythm.   Pulmonary/Chest: Effort normal and breath sounds without rales or  wheezing.  Abd:  Soft, NT, ND, + BS Neurological: Pt is alert. Not confused , motor grossly intact Skin: Skin is warm. No rash, no LE edema Psychiatric: Pt behavior is normal. No agitation.  No other signifcant exam changes      Assessment & Plan:

## 2015-11-02 ENCOUNTER — Encounter: Payer: Self-pay | Admitting: Internal Medicine

## 2015-11-02 NOTE — Assessment & Plan Note (Signed)
stable overall by history and exam, recent data reviewed with pt, and pt to continue medical treatment as before,  to f/u any worsening symptoms or concerns BP Readings from Last 3 Encounters:  10/27/15 138/80  10/19/15 136/80  09/14/15 (!) 176/96

## 2015-11-02 NOTE — Assessment & Plan Note (Addendum)
[facility],  to f/u any worsening symptoms or concerns, refer GI per pt request

## 2015-11-18 ENCOUNTER — Telehealth: Payer: Self-pay | Admitting: Internal Medicine

## 2015-11-18 ENCOUNTER — Ambulatory Visit: Payer: BC Managed Care – PPO | Admitting: Internal Medicine

## 2015-11-18 NOTE — Telephone Encounter (Signed)
Thanks for letting us know. 

## 2016-01-18 ENCOUNTER — Ambulatory Visit: Payer: BC Managed Care – PPO | Admitting: Internal Medicine

## 2016-02-25 ENCOUNTER — Ambulatory Visit (INDEPENDENT_AMBULATORY_CARE_PROVIDER_SITE_OTHER): Payer: BC Managed Care – PPO | Admitting: Internal Medicine

## 2016-02-25 ENCOUNTER — Encounter (INDEPENDENT_AMBULATORY_CARE_PROVIDER_SITE_OTHER): Payer: Self-pay

## 2016-02-25 ENCOUNTER — Encounter: Payer: Self-pay | Admitting: Internal Medicine

## 2016-02-25 VITALS — BP 152/100 | HR 84 | Ht 62.75 in | Wt 146.2 lb

## 2016-02-25 DIAGNOSIS — K5901 Slow transit constipation: Secondary | ICD-10-CM

## 2016-02-25 DIAGNOSIS — K601 Chronic anal fissure: Secondary | ICD-10-CM | POA: Diagnosis not present

## 2016-02-25 MED ORDER — LUBIPROSTONE 24 MCG PO CAPS: 24.0000 ug | ORAL_CAPSULE | Freq: Two times a day (BID) | ORAL | 3 refills | Status: DC

## 2016-02-25 MED ORDER — DILTIAZEM GEL 2 %
1.0000 | Freq: Three times a day (TID) | CUTANEOUS | 3 refills | Status: DC
Start: 2016-02-25 — End: 2022-02-23

## 2016-02-25 NOTE — Progress Notes (Addendum)
Kristie Nelson 36 y.o. 1980/08/18 [id number] Referred by: Corwin Levins, MD [address] [place], [place] [address]  Assessment & Plan:   Encounter Diagnoses  Name Primary?  . Chronic posterior anal fissure Yes  . 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 She also has intermittent rectal pain and some bleeding. Says hemorrhoids are "always out". Defecates 1-2 x a week, when gets the urge no difficulty producing a stool but has many for uo to 30 mins. Some are large, never terribly hard or loose - formed, soft and not scybalous. No prior GI evaluations. Was seen in PCP clinics in Fall 2017. Says fiber and MiraLax have not helped defecation and dulcolax was "too strong". Has been tx w/OTC and Rx Hydrocortisone topical for hemorrhoids but the sxs persist Allergies  Allergen Reactions  . Latex Itching    swelling  . Peanut-Containing Drug Products     REACTION: throat swelling  . Penicillins     REACTION: itching   Current Meds  Medication Sig  . amLODipine (NORVASC) 10 MG tablet Take 1 tablet (10 mg total) by mouth daily.  . cetirizine (ZYRTEC) 10 MG tablet Take 1 tablet (10 mg total) by mouth daily. (Patient taking differently: Take 10 mg by mouth as needed. )  . diclofenac (VOLTAREN) 75 MG EC tablet Take 1 tablet (75 mg total) by mouth 2 (two) times daily. (Patient taking differently: Take 75 mg by mouth as needed. )  . metoprolol succinate (TOPROL-XL) 50 MG 24 hr tablet TAKE ONE TABLET BY MOUTH ONCE DAILY  . triamcinolone (NASACORT AQ) 55 MCG/ACT AERO nasal inhaler Place 2 sprays into the nose daily.  Marland Kitchen  triamcinolone cream (KENALOG) 0.1 % Apply 1 application topically 2 (two) times daily.   Past Medical History:  Diagnosis Date  . ALLERGIC RHINITIS 04/03/2007  . ANXIETY 04/03/2007  . DEPRESSION 04/03/2007  . Dysrhythmia    palpitations due to stress  . ECZEMA 03/19/2009  . Heartburn in pregnancy   . Hemorrhoids   . HYPERLIPIDEMIA 12/09/2009  . HYPERTENSION 04/03/2007  . MIGRAINE, COMMON 12/09/2009  . Postoperative anemia due to acute blood loss 08/30/2012  . Shortness of breath    with anxiety   Past Surgical History:  Procedure Laterality Date  . CESAREAN SECTION  2010  . CESAREAN SECTION N/A 08/29/2012   Procedure: Repeat CESAREAN SECTION;  Surgeon: Lenoard Aden, MD;  Location: [facility];  Service: Obstetrics;  Laterality: N/A;  EDD: 09/17/12;REQUEST DEE;[name]   family history includes Diabetes in her brother, father, maternal grandmother, and mother; Heart disease in her mother; Hyperlipidemia in her maternal grandmother and mother; Hypertension in her brother, father, maternal grandfather, maternal grandmother, and mother; Liver cancer in her maternal grandfather; Thyroid disease in her maternal grandmother. Social History   Social History  . Marital status: Married    Spouse name: N/A  . Number of children: 2  . Years of education: N/A   Occupational History  . TEACHER [employer]   Social History Main Topics  . Smoking status: Never Smoker  . Smokeless tobacco: Never Used  . Alcohol use No  .  Drug use: No  . Sexual activity: Not Asked   Other Topics Concern  . None   Social History Narrative   Kindergarten teacher - [employer]   Married, 1 son 2010 and 1 daughter 2014   02/25/2016       Review of Systems All other ROS negative  Objective:   Physical Exam @BP  (!) 152/100 (BP Location: Left Arm, Patient Position: Sitting, Cuff Size: Normal)   Pulse 84   Ht 5' 2.75" (1.594 m) Comment: height measured without shoes  Wt 146 lb 4 oz (66.3 kg)   LMP  02/21/2016   BMI 26.11 kg/m @  General:  Well-developed, well-nourished and in no acute distress Eyes:  anicteric. ENT:   Mouth and posterior pharynx free of lesions.  Neck:   supple w/o thyromegaly or mass.  Lungs: Clear to auscultation bilaterally. Heart:  S1S2, no rubs, murmurs, gallops. Abdomen:  soft, non-tender, no hepatosplenomegaly, hernia, or mass and BS+.  Rectal:  Small anal tags NL tone, tender and sl indurated posterior No mass Brown stool  Anoscopy:  post fissure seen, no sig abnl hemorrhoids noted  Lymph:  no cervical or supraclavicular adenopathy. Extremities:   no edema, cyanosis or clubbing Skin   no rash. Neuro:  A&O x 3.  Psych:  appropriate mood and  Affect.   Data Reviewed:  As per HPI Lab Results  Component Value Date   WBC 8.3 06/19/2015   HGB 11.9 (L) 06/19/2015   HCT 35.6 (L) 06/19/2015   MCV 88.2 06/19/2015   PLT 309.0 06/19/2015

## 2016-02-25 NOTE — Patient Instructions (Signed)
   We have sent the following medications to your pharmacy for you to pick up at your convenience: Amitiza   We have sent a prescription for diltiazem gel to [facility]. You should apply a pea size amount to your rectum twice a day and after bowel movements.  [facility]'s information is below: Address: [address], [place], [place] [address]  [phone]  Please follow up with Dr Leone PayorGessner in 2 months.   We are giving you a handout to read on anal fissures.    I appreciate the opportunity to care for you. Stan Headarl Gessner, MD, [facility]

## 2016-03-09 ENCOUNTER — Other Ambulatory Visit: Payer: Self-pay | Admitting: Internal Medicine

## 2016-04-11 ENCOUNTER — Other Ambulatory Visit: Payer: Self-pay | Admitting: Internal Medicine

## 2016-04-12 ENCOUNTER — Telehealth: Payer: Self-pay | Admitting: Internal Medicine

## 2016-04-12 NOTE — Telephone Encounter (Signed)
Lm for pt to schedule a physical with Dr John for prescription refills per Shirron. ° °

## 2016-04-22 ENCOUNTER — Ambulatory Visit: Payer: BC Managed Care – PPO | Admitting: Internal Medicine

## 2016-05-23 ENCOUNTER — Ambulatory Visit: Payer: BC Managed Care – PPO | Admitting: Internal Medicine

## 2016-06-07 ENCOUNTER — Telehealth: Payer: Self-pay | Admitting: Internal Medicine

## 2016-06-07 NOTE — Telephone Encounter (Signed)
Patient reports that she had some rectal pain with a BM and some minor rectal bleeding.  She reports that she is using diltiazem TID.  She is not sure if the fissure has gotten worse or if this is a hemorrhoid.  Do you want her to come in for eval soon?

## 2016-06-07 NOTE — Telephone Encounter (Signed)
She will come in tomorrow at 3:30 and see Dr. Leone PayorGessner

## 2016-06-07 NOTE — Telephone Encounter (Signed)
I am willing to see her tomorrow PM

## 2016-06-08 ENCOUNTER — Ambulatory Visit (INDEPENDENT_AMBULATORY_CARE_PROVIDER_SITE_OTHER): Payer: BC Managed Care – PPO | Admitting: Internal Medicine

## 2016-06-08 ENCOUNTER — Encounter: Payer: Self-pay | Admitting: Internal Medicine

## 2016-06-08 VITALS — BP 124/72 | HR 80 | Ht 62.75 in | Wt 148.0 lb

## 2016-06-08 DIAGNOSIS — K645 Perianal venous thrombosis: Secondary | ICD-10-CM | POA: Diagnosis not present

## 2016-06-08 DIAGNOSIS — K601 Chronic anal fissure: Secondary | ICD-10-CM

## 2016-06-08 MED ORDER — AMBULATORY NON FORMULARY MEDICATION: 2 refills | Status: DC

## 2016-06-08 NOTE — Patient Instructions (Addendum)
  Take 1 tablespoon of Benefiber daily, handout provided.   Continue your sitz baths.   Use the dilitizem/lidocaine 2-3 times a day.This has been sent to [facility].  Keep your follow up appointment with Dr Leone PayorGessner in July.    I appreciate the opportunity to care for you. Stan Headarl Gessner, MD, [facility]
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
## 2016-06-12 ENCOUNTER — Encounter: Payer: Self-pay | Admitting: Internal Medicine

## 2016-07-11 ENCOUNTER — Ambulatory Visit: Payer: BC Managed Care – PPO | Admitting: Internal Medicine

## 2016-07-11 ENCOUNTER — Other Ambulatory Visit: Payer: Self-pay | Admitting: Internal Medicine

## 2016-08-01 ENCOUNTER — Ambulatory Visit: Payer: BC Managed Care – PPO | Admitting: Internal Medicine

## 2016-08-12 ENCOUNTER — Telehealth: Payer: Self-pay | Admitting: Internal Medicine

## 2016-08-12 MED ORDER — AMLODIPINE BESYLATE 10 MG PO TABS: 10.0000 mg | ORAL_TABLET | Freq: Every day | ORAL | 0 refills | Status: DC

## 2016-08-12 NOTE — Addendum Note (Signed)
Addended by: Deatra JamesBRAND, Welton Bord M on: 08/12/2016 12:06 PM   Modules accepted: Orders

## 2016-08-12 NOTE — Telephone Encounter (Signed)
Pt called stating that this prescription was denied because she needs an appointment. She has her physical scheduled for Tuesday but will run out of this medication tomorrow.

## 2016-08-12 NOTE — Telephone Encounter (Signed)
Called pt no answer LMOM can only send 7 day supply until; appt on Tues. Rx sent to [facility] electronically...Raechel Chute/lmb

## 2016-08-16 ENCOUNTER — Ambulatory Visit (INDEPENDENT_AMBULATORY_CARE_PROVIDER_SITE_OTHER): Payer: BC Managed Care – PPO | Admitting: Internal Medicine

## 2016-08-16 ENCOUNTER — Encounter: Payer: Self-pay | Admitting: Internal Medicine

## 2016-08-16 ENCOUNTER — Other Ambulatory Visit (INDEPENDENT_AMBULATORY_CARE_PROVIDER_SITE_OTHER): Payer: BC Managed Care – PPO

## 2016-08-16 ENCOUNTER — Other Ambulatory Visit: Payer: Self-pay | Admitting: Internal Medicine

## 2016-08-16 VITALS — BP 128/80 | HR 83 | Ht 62.75 in | Wt 148.0 lb

## 2016-08-16 DIAGNOSIS — Z0000 Encounter for general adult medical examination without abnormal findings: Secondary | ICD-10-CM

## 2016-08-16 DIAGNOSIS — M5441 Lumbago with sciatica, right side: Secondary | ICD-10-CM

## 2016-08-16 DIAGNOSIS — Z0001 Encounter for general adult medical examination with abnormal findings: Secondary | ICD-10-CM | POA: Diagnosis not present

## 2016-08-16 LAB — CBC WITH DIFFERENTIAL/PLATELET
BASOS PCT: 0.8 % (ref 0.0–3.0)
Basophils Absolute: 0.1 10*3/uL (ref 0.0–0.1)
EOS PCT: 1.5 % (ref 0.0–5.0)
Eosinophils Absolute: 0.1 10*3/uL (ref 0.0–0.7)
HEMATOCRIT: 35.4 % — AB (ref 36.0–46.0)
HEMOGLOBIN: 12.1 g/dL (ref 12.0–15.0)
LYMPHS PCT: 37.6 % (ref 12.0–46.0)
Lymphs Abs: 2.7 10*3/uL (ref 0.7–4.0)
MCHC: 34.2 g/dL (ref 30.0–36.0)
MCV: 89.9 fl (ref 78.0–100.0)
Monocytes Absolute: 0.4 10*3/uL (ref 0.1–1.0)
Monocytes Relative: 6 % (ref 3.0–12.0)
Neutro Abs: 3.8 10*3/uL (ref 1.4–7.7)
Neutrophils Relative %: 54.1 % (ref 43.0–77.0)
Platelets: 330 10*3/uL (ref 150.0–400.0)
RBC: 3.94 Mil/uL (ref 3.87–5.11)
RDW: 13.6 % (ref 11.5–15.5)
WBC: 7.1 10*3/uL (ref 4.0–10.5)

## 2016-08-16 LAB — URINALYSIS, ROUTINE W REFLEX MICROSCOPIC
Bilirubin Urine: NEGATIVE
Ketones, ur: NEGATIVE
LEUKOCYTES UA: NEGATIVE
NITRITE: NEGATIVE
Specific Gravity, Urine: 1.01 (ref 1.000–1.030)
Total Protein, Urine: NEGATIVE
Urine Glucose: NEGATIVE
Urobilinogen, UA: 0.2 (ref 0.0–1.0)
pH: 7.5 (ref 5.0–8.0)

## 2016-08-16 LAB — BASIC METABOLIC PANEL
BUN: 10 mg/dL (ref 6–23)
CALCIUM: 9 mg/dL (ref 8.4–10.5)
CO2: 32 mEq/L (ref 19–32)
CREATININE: 0.67 mg/dL (ref 0.40–1.20)
Chloride: 101 mEq/L (ref 96–112)
GFR: 126.4 mL/min (ref >60.00)
Glucose, Bld: 95 mg/dL (ref 70–99)
Potassium: 3 mEq/L — ABNORMAL LOW (ref 3.5–5.1)
Sodium: 138 mEq/L (ref 135–145)

## 2016-08-16 LAB — HEPATIC FUNCTION PANEL
ALBUMIN: 4.4 g/dL (ref 3.5–5.2)
ALT: 7 U/L (ref 0–35)
AST: 14 U/L (ref 0–37)
Alkaline Phosphatase: 57 U/L (ref 39–117)
BILIRUBIN TOTAL: 0.4 mg/dL (ref 0.2–1.2)
Bilirubin, Direct: 0.1 mg/dL (ref 0.0–0.3)
Total Protein: 7 g/dL (ref 6.0–8.3)

## 2016-08-16 LAB — LIPID PANEL
CHOL/HDL RATIO: 5
CHOLESTEROL: 173 mg/dL (ref 0–200)
HDL: 32.5 mg/dL — AB (ref >39.00)
NonHDL: 140.7
TRIGLYCERIDES: 252 mg/dL — AB (ref 0.0–149.0)
VLDL: 50.4 mg/dL — AB (ref 0.0–40.0)

## 2016-08-16 LAB — LDL CHOLESTEROL, DIRECT: LDL DIRECT: 117 mg/dL

## 2016-08-16 LAB — TSH: TSH: 0.73 u[IU]/mL (ref 0.35–4.50)

## 2016-08-16 MED ORDER — CYCLOBENZAPRINE HCL 5 MG PO TABS: 5.0000 mg | ORAL_TABLET | Freq: Three times a day (TID) | ORAL | 2 refills | Status: DC | PRN

## 2016-08-16 MED ORDER — POTASSIUM CHLORIDE ER 10 MEQ PO TBCR: 10.0000 meq | EXTENDED_RELEASE_TABLET | Freq: Every day | ORAL | 0 refills | Status: DC

## 2016-08-16 MED ORDER — METOPROLOL SUCCINATE ER 50 MG PO TB24: 50.0000 mg | ORAL_TABLET | Freq: Every day | ORAL | 3 refills | Status: DC

## 2016-08-16 MED ORDER — AMLODIPINE BESYLATE 10 MG PO TABS: 10.0000 mg | ORAL_TABLET | Freq: Every day | ORAL | 3 refills | Status: DC

## 2016-08-16 NOTE — Progress Notes (Signed)
Subjective:    Patient ID: Kristie Nelson, female    DOB: [birth year], 36 y.o.   MRN: [id number]  HPI  Here for wellness and f/u;  Overall doing ok;  Pt denies Chest pain, worsening SOB, DOE, wheezing, orthopnea, PND, worsening LE edema, palpitations, dizziness or syncope.  Pt denies neurological change such as new headache, facial or extremity weakness.  Pt denies polydipsia, polyuria, or low sugar symptoms. Pt states overall good compliance with treatment and medications, good tolerability, and has been trying to follow appropriate diet.  Pt denies worsening depressive symptoms, suicidal ideation or panic. No fever, night sweats, wt loss, loss of appetite, or other constitutional symptoms.  Pt states good ability with ADL's, has low fall risk, home safety reviewed and adequate, no other significant changes in hearing or vision, and only occasionally active with exercise.  Does have recurring LBP mostly left sided, mild, intermittent, without radicular symptoms, and wore to stand up. Past Medical History:  Diagnosis Date  . ALLERGIC RHINITIS 04/03/2007  . Anal fissure   . ANXIETY 04/03/2007  . DEPRESSION 04/03/2007  . Dysrhythmia    palpitations due to stress  . ECZEMA 03/19/2009  . Heartburn in pregnancy   . Hemorrhoids   . HYPERLIPIDEMIA 12/09/2009  . HYPERTENSION 04/03/2007  . MIGRAINE, COMMON 12/09/2009  . Postoperative anemia due to acute blood loss 08/30/2012  . Shortness of breath    with anxiety   Past Surgical History:  Procedure Laterality Date  . CESAREAN SECTION  2010  . CESAREAN SECTION N/A 08/29/2012   Procedure: Repeat CESAREAN SECTION;  Surgeon: Lenoard Aden, MD;  Location: [facility];  Service: Obstetrics;  Laterality: N/A;  EDD: 09/17/12;REQUEST DEE;[name]    reports that she has never smoked. She has never used smokeless tobacco. She reports that she does not drink alcohol or use drugs. family history includes Diabetes in her brother, father, maternal grandmother, and  mother; Heart disease in her mother; Hyperlipidemia in her maternal grandmother and mother; Hypertension in her brother, father, maternal grandfather, maternal grandmother, and mother; Liver cancer in her maternal grandfather; Thyroid disease in her maternal grandmother. Allergies  Allergen Reactions  . Latex Itching    swelling  . Peanut-Containing Drug Products     REACTION: throat swelling  . Penicillins     REACTION: itching   Current Outpatient Prescriptions on File Prior to Visit  Medication Sig Dispense Refill  . AMBULATORY NON FORMULARY MEDICATION Diltiazem 2% mixed with Lidocaine 5%, ratio 1:1  Apply to rectum 2-3 times a day 30 g 2  . cetirizine (ZYRTEC) 10 MG tablet Take 1 tablet (10 mg total) by mouth daily. (Patient taking differently: Take 10 mg by mouth as needed. ) 30 tablet 11  . diltiazem 2 % GEL Apply 1 application topically 3 (three) times daily. (Patient taking differently: Apply 1 application topically as needed. ) 30 g 3  . lubiprostone (AMITIZA) 24 MCG capsule Take 1 capsule (24 mcg total) by mouth 2 (two) times daily with a meal. (Patient taking differently: Take 24 mcg by mouth daily with breakfast. ) 60 capsule 3  . triamcinolone (NASACORT AQ) 55 MCG/ACT AERO nasal inhaler Place 2 sprays into the nose daily. 1 Inhaler 12  . triamcinolone cream (KENALOG) 0.1 % Apply 1 application topically 2 (two) times daily. 30 g 1   No current facility-administered medications on file prior to visit.    Wt Readings from Last 3 Encounters:  08/16/16 148 lb (67.1 kg)  06/08/16 148  lb (67.1 kg)  02/25/16 146 lb 4 oz (66.3 kg)   Review of Systems Constitutional: Negative for other unusual diaphoresis, sweats, appetite or weight changes HENT: Negative for other worsening hearing loss, ear pain, facial swelling, mouth sores or neck stiffness.   Eyes: Negative for other worsening pain, redness or other visual disturbance.  Respiratory: Negative for other stridor or  swelling Cardiovascular: Negative for other palpitations or other chest pain  Gastrointestinal: Negative for worsening diarrhea or loose stools, blood in stool, distention or other pain Genitourinary: Negative for hematuria, flank pain or other change in urine volume.  Musculoskeletal: Negative for myalgias or other joint swelling.  Skin: Negative for other color change, or other wound or worsening drainage.  Neurological: Negative for other syncope or numbness. Hematological: Negative for other adenopathy or swelling Psychiatric/Behavioral: Negative for hallucinations, other worsening agitation, SI, self-injury, or new decreased concentration  All other system neg per pty    Objective:   Physical Exam BP 128/80   Pulse 83   Ht 5' 2.75" (1.594 m)   Wt 148 lb (67.1 kg)   SpO2 100%   BMI 26.43 kg/m  VS noted,  Constitutional: Pt is oriented to person, place, and time. Appears well-developed and well-nourished, in no significant distress and comfortable Head: Normocephalic and atraumatic  Eyes: Conjunctivae and EOM are normal. Pupils are equal, round, and reactive to light Right Ear: External ear normal without discharge Left Ear: External ear normal without discharge Nose: Nose without discharge or deformity Mouth/Throat: Oropharynx is without other ulcerations and moist  Neck: Normal range of motion. Neck supple. No JVD present. No tracheal deviation present or significant neck LA or mass Cardiovascular: Normal rate, regular rhythm, normal heart sounds and intact distal pulses.   Pulmonary/Chest: WOB normal and breath sounds without rales or wheezing  Abdominal: Soft. Bowel sounds are normal. NT. No HSM  Musculoskeletal: Normal range of motion. Exhibits no edema, spine nontender in midline but hs left lumjar mild msk spasm/tender Lymphadenopathy: Has no other cervical adenopathy.  Neurological: Pt is alert and oriented to person, place, and time. Pt has normal reflexes. No cranial  nerve deficit. Motor grossly intact, Gait intact Skin: Skin is warm and dry. No rash noted or new ulcerations Psychiatric:  Has normal mood and affect. Behavior is normal without agitation No other exam findings Lab Results  Component Value Date   WBC 7.1 08/16/2016   HGB 12.1 08/16/2016   HCT 35.4 (L) 08/16/2016   PLT 330.0 08/16/2016   GLUCOSE 95 08/16/2016   CHOL 173 08/16/2016   TRIG 252.0 (H) 08/16/2016   HDL 32.50 (L) 08/16/2016   LDLDIRECT 117.0 08/16/2016   LDLCALC 132 (H) 06/19/2015   ALT 7 08/16/2016   AST 14 08/16/2016   NA 138 08/16/2016   K 3.0 (L) 08/16/2016   CL 101 08/16/2016   CREATININE 0.67 08/16/2016   BUN 10 08/16/2016   CO2 32 08/16/2016   TSH 0.73 08/16/2016          Assessment & Plan:

## 2016-08-16 NOTE — Assessment & Plan Note (Signed)
C/w msk strain, for muscle relaxer prn,  to f/u any worsening symptoms or concerns 

## 2016-08-16 NOTE — Patient Instructions (Addendum)
Please take all new medication as prescribed - the muscle relaxer as needed  Please continue all other medications as before, and refills have been done if requested.  Please have the pharmacy call with any other refills you may need.  Please continue your efforts at being more active, low cholesterol diet, and weight control.  You are otherwise up to date with prevention measures today.  Please keep your appointments with your specialists as you may have planned  Please go to the LAB in the Basement (turn left off the elevator) for the tests to be done today  You will be contacted by phone if any changes need to be made immediately.  Otherwise, you will receive a letter about your results with an explanation, but please check with MyChart first.  Please remember to sign up for MyChart if you have not done so, as this will be important to you in the future with finding out test results, communicating by private email, and scheduling acute appointments online when needed.  Please return in 1 year for your yearly visit, or sooner if needed, with Lab testing done 3-5 days before

## 2016-08-16 NOTE — Assessment & Plan Note (Signed)

## 2016-08-17 ENCOUNTER — Telehealth: Payer: Self-pay

## 2016-08-17 NOTE — Telephone Encounter (Signed)
Pt has viewed results on MyChart. 

## 2016-08-17 NOTE — Telephone Encounter (Signed)
-----   Message from Corwin LevinsJames W John, MD sent at 08/16/2016  6:32 PM EDT ----- Left message on MyChart, pt to cont same tx except  The test results show that your current treatment is OK, except the potassium is low again. We will need to treat with a potassium pill for 2 weeks.  I will send a new prescription, and you should hear from the office about this as well.    Kristie Nelson to please inform pt, I will do rx

## 2016-10-06 ENCOUNTER — Other Ambulatory Visit (INDEPENDENT_AMBULATORY_CARE_PROVIDER_SITE_OTHER): Payer: BC Managed Care – PPO

## 2016-10-06 ENCOUNTER — Encounter: Payer: Self-pay | Admitting: Internal Medicine

## 2016-10-06 ENCOUNTER — Ambulatory Visit (INDEPENDENT_AMBULATORY_CARE_PROVIDER_SITE_OTHER): Payer: BC Managed Care – PPO | Admitting: Internal Medicine

## 2016-10-06 VITALS — BP 142/84 | HR 80 | Temp 98.0°F | Ht 62.75 in | Wt 145.0 lb

## 2016-10-06 DIAGNOSIS — E876 Hypokalemia: Secondary | ICD-10-CM | POA: Diagnosis not present

## 2016-10-06 DIAGNOSIS — N921 Excessive and frequent menstruation with irregular cycle: Secondary | ICD-10-CM

## 2016-10-06 DIAGNOSIS — N926 Irregular menstruation, unspecified: Secondary | ICD-10-CM

## 2016-10-06 DIAGNOSIS — I10 Essential (primary) hypertension: Secondary | ICD-10-CM

## 2016-10-06 LAB — CBC WITH DIFFERENTIAL/PLATELET
Basophils Absolute: 0.1 10*3/uL (ref 0.0–0.1)
Basophils Relative: 1.2 % (ref 0.0–3.0)
EOS ABS: 0.2 10*3/uL (ref 0.0–0.7)
Eosinophils Relative: 2.1 % (ref 0.0–5.0)
HCT: 36.9 % (ref 36.0–46.0)
HEMOGLOBIN: 12.1 g/dL (ref 12.0–15.0)
Lymphocytes Relative: 38.6 % (ref 12.0–46.0)
Lymphs Abs: 3.3 10*3/uL (ref 0.7–4.0)
MCHC: 32.9 g/dL (ref 30.0–36.0)
MCV: 89.7 fl (ref 78.0–100.0)
MONO ABS: 0.7 10*3/uL (ref 0.1–1.0)
Monocytes Relative: 7.8 % (ref 3.0–12.0)
Neutro Abs: 4.3 10*3/uL (ref 1.4–7.7)
Neutrophils Relative %: 50.3 % (ref 43.0–77.0)
Platelets: 367 10*3/uL (ref 150.0–400.0)
RBC: 4.11 Mil/uL (ref 3.87–5.11)
RDW: 13.1 % (ref 11.5–15.5)
WBC: 8.5 10*3/uL (ref 4.0–10.5)

## 2016-10-06 NOTE — Assessment & Plan Note (Signed)
Mild, unclear etiology, for f/u K today

## 2016-10-06 NOTE — Progress Notes (Signed)
Subjective:    Patient ID: Kristie Nelson, female    DOB: 06/06/1980, 36 y.o.   MRN: [id number]  HPI  Here to f/u with c/o of multiple irreg menses x 4 mo assoc with cramping, fatigue and recurrent headaches.  No hx of similar or known fibroids or malignancy.  Is not able to be seen per GYN for several months (Feb 2019 per pt).  No recent anemia or bleeding diathesis.  Pt denies chest pain, increased sob or doe, wheezing, orthopnea, PND, increased LE swelling, palpitations, dizziness or syncope.   Pt denies polydipsia, polyuria Most recent lab c/w mild hypokalemia. Denies muscle cramps, n/v or diarrhea, and is not on diuretic Wt Readings from Last 3 Encounters:  10/06/16 145 lb (65.8 kg)  08/16/16 148 lb (67.1 kg)  06/08/16 148 lb (67.1 kg)   Past Medical History:  Diagnosis Date  . ALLERGIC RHINITIS 04/03/2007  . Anal fissure   . ANXIETY 04/03/2007  . DEPRESSION 04/03/2007  . Dysrhythmia    palpitations due to stress  . ECZEMA 03/19/2009  . Heartburn in pregnancy   . Hemorrhoids   . HYPERLIPIDEMIA 12/09/2009  . HYPERTENSION 04/03/2007  . MIGRAINE, COMMON 12/09/2009  . Postoperative anemia due to acute blood loss 08/30/2012  . Shortness of breath    with anxiety   Past Surgical History:  Procedure Laterality Date  . CESAREAN SECTION  2010  . CESAREAN SECTION N/A 08/29/2012   Procedure: Repeat CESAREAN SECTION;  Surgeon: Lenoard Aden, MD;  Location: [facility];  Service: Obstetrics;  Laterality: N/A;  EDD: 09/17/12;REQUEST DEE;[name]    reports that she has never smoked. She has never used smokeless tobacco. She reports that she does not drink alcohol or use drugs. family history includes Diabetes in her brother, father, maternal grandmother, and mother; Heart disease in her mother; Hyperlipidemia in her maternal grandmother and mother; Hypertension in her brother, father, maternal grandfather, maternal grandmother, and mother; Liver cancer in her maternal grandfather; Thyroid disease in  her maternal grandmother. Allergies  Allergen Reactions  . Latex Itching    swelling  . Peanut-Containing Drug Products     REACTION: throat swelling  . Penicillins     REACTION: itching   Current Outpatient Prescriptions on File Prior to Visit  Medication Sig Dispense Refill  . AMBULATORY NON FORMULARY MEDICATION Diltiazem 2% mixed with Lidocaine 5%, ratio 1:1  Apply to rectum 2-3 times a day 30 g 2  . amLODipine (NORVASC) 10 MG tablet Take 1 tablet (10 mg total) by mouth daily. 90 tablet 3  . cetirizine (ZYRTEC) 10 MG tablet Take 1 tablet (10 mg total) by mouth daily. (Patient taking differently: Take 10 mg by mouth as needed. ) 30 tablet 11  . cyclobenzaprine (FLEXERIL) 5 MG tablet Take 1 tablet (5 mg total) by mouth 3 (three) times daily as needed for muscle spasms. 30 tablet 2  . diltiazem 2 % GEL Apply 1 application topically 3 (three) times daily. (Patient taking differently: Apply 1 application topically as needed. ) 30 g 3  . lubiprostone (AMITIZA) 24 MCG capsule Take 1 capsule (24 mcg total) by mouth 2 (two) times daily with a meal. (Patient taking differently: Take 24 mcg by mouth daily with breakfast. ) 60 capsule 3  . metoprolol succinate (TOPROL-XL) 50 MG 24 hr tablet Take 1 tablet (50 mg total) by mouth daily. Take with or immediately following a meal. 90 tablet 3  . potassium chloride (K-DUR) 10 MEQ tablet Take 1 tablet (  10 mEq total) by mouth daily. 14 tablet 0  . triamcinolone (NASACORT AQ) 55 MCG/ACT AERO nasal inhaler Place 2 sprays into the nose daily. 1 Inhaler 12  . triamcinolone cream (KENALOG) 0.1 % Apply 1 application topically 2 (two) times daily. 30 g 1   No current facility-administered medications on file prior to visit.    Review of Systems  Constitutional: Negative for other unusual diaphoresis or sweats HENT: Negative for ear discharge or swelling Eyes: Negative for other worsening visual disturbances Respiratory: Negative for stridor or other swelling    Gastrointestinal: Negative for worsening distension or other blood Genitourinary: Negative for retention or other urinary change Musculoskeletal: Negative for other MSK pain or swelling Skin: Negative for color change or other new lesions Neurological: Negative for worsening tremors and other numbness  Psychiatric/Behavioral: Negative for worsening agitation or other fatigue All other system neg per pt    Objective:   Physical Exam BP (!) 142/84   Pulse 80   Temp 98 F (36.7 C) (Oral)   Ht 5' 2.75" (1.594 m)   Wt 145 lb (65.8 kg)   SpO2 99%   BMI 25.89 kg/m  VS noted,  Constitutional: Pt appears in NAD HENT: Head: NCAT.  Right Ear: External ear normal.  Left Ear: External ear normal.  Eyes: . Pupils are equal, round, and reactive to light. Conjunctivae and EOM are normal Nose: without d/c or deformity Neck: Neck supple. Gross normal ROM Cardiovascular: Normal rate and regular rhythm.   Pulmonary/Chest: Effort normal and breath sounds without rales or wheezing.  Abd:  Soft, NT, ND, + BS, no organomegaly Neurological: Pt is alert. At baseline orientation, motor grossly intact Skin: Skin is warm. No rashes, other new lesions, no LE edema Psychiatric: Pt behavior is normal without agitation  No other exam findings    Assessment & Plan:

## 2016-10-06 NOTE — Patient Instructions (Signed)
Please continue all other medications as before, and refills have been done if requested.  Please have the pharmacy call with any other refills you may need.  Please keep your appointments with your specialists as you may have planned  You will be contacted regarding the referral for: urgent referral to Dr Billy Coast  Please go to the LAB in the Basement (turn left off the elevator) for the tests to be done today  You will be contacted by phone if any changes need to be made immediately.  Otherwise, you will receive a letter about your results with an explanation, but please check with MyChart first.  Please remember to sign up for MyChart if you have not done so, as this will be important to you in the future with finding out test results, communicating by private email, and scheduling acute appointments online when needed.

## 2016-10-06 NOTE — Assessment & Plan Note (Signed)
Mild elevated likely situational, o/w stable overall by history and exam, recent data reviewed with pt, and pt to continue medical treatment as before,  to f/u any worsening symptoms or concerns BP Readings from Last 3 Encounters:  10/06/16 (!) 142/84  08/16/16 128/80  06/08/16 124/72

## 2016-10-06 NOTE — Assessment & Plan Note (Signed)
Etiology unclear, ? Fibroids vs other, for f/u cbc today, and urgent referral to GYN, will likely need pelvic u/s/abd u/s and complete exam

## 2016-10-07 ENCOUNTER — Telehealth: Payer: Self-pay

## 2016-10-07 ENCOUNTER — Other Ambulatory Visit: Payer: Self-pay | Admitting: Internal Medicine

## 2016-10-07 LAB — BASIC METABOLIC PANEL
BUN: 13 mg/dL (ref 6–23)
CO2: 30 meq/L (ref 19–32)
Calcium: 9.8 mg/dL (ref 8.4–10.5)
Chloride: 99 mEq/L (ref 96–112)
Creatinine, Ser: 0.75 mg/dL (ref 0.40–1.20)
GFR: 110.89 mL/min (ref >60.00)
GLUCOSE: 87 mg/dL (ref 70–99)
POTASSIUM: 3 meq/L — AB (ref 3.5–5.1)
SODIUM: 138 meq/L (ref 135–145)

## 2016-10-07 MED ORDER — POTASSIUM CHLORIDE ER 10 MEQ PO TBCR: 20.0000 meq | EXTENDED_RELEASE_TABLET | Freq: Every day | ORAL | 0 refills | Status: DC

## 2016-10-07 NOTE — Telephone Encounter (Signed)
Pt has seen via MyChart

## 2016-10-07 NOTE — Telephone Encounter (Signed)
-----   Message from Corwin Levins, MD sent at 10/07/2016 12:54 PM EDT ----- Left message on MyChart, pt to cont same tx except  The test results show that your current treatment is OK. Except the potassium is still mild low.  We should increase the potassium pills to 2 per day.  I will send a new prescription, and you should hear from the office as well.     Clell Trahan to please inform pt, I will do rx

## 2016-10-18 ENCOUNTER — Encounter: Payer: Self-pay | Admitting: Internal Medicine

## 2016-10-19 NOTE — Telephone Encounter (Signed)
Shirron to ask for recent labs including thyroid testing per Dr Billy Coastaavon (GYN) please

## 2016-12-29 ENCOUNTER — Ambulatory Visit: Payer: BC Managed Care – PPO | Admitting: Internal Medicine

## 2016-12-29 ENCOUNTER — Encounter: Payer: Self-pay | Admitting: Internal Medicine

## 2016-12-29 VITALS — BP 122/84 | HR 81 | Temp 97.9°F | Ht 62.75 in | Wt 144.0 lb

## 2016-12-29 DIAGNOSIS — K645 Perianal venous thrombosis: Secondary | ICD-10-CM | POA: Diagnosis not present

## 2016-12-29 MED ORDER — TRAMADOL HCL 50 MG PO TABS: 50.0000 mg | ORAL_TABLET | Freq: Four times a day (QID) | ORAL | 0 refills | Status: DC | PRN

## 2016-12-29 MED ORDER — LIDOCAINE-HYDROCORTISONE ACE 3-2.5 % RE KIT: PACK | RECTAL | 0 refills | Status: DC

## 2016-12-29 NOTE — Patient Instructions (Signed)
Please take all new medication as prescribed - the cream, and the pain medication if needed  You will be contacted regarding the referral for: General Surgury - to see [facility] now  Please continue all other medications as before, and refills have been done if requested.  Please have the pharmacy call with any other refills you may need.  Please keep your appointments with your specialists as you may have planned

## 2016-12-29 NOTE — Assessment & Plan Note (Signed)
With mod to severe pain about 48 hrs old, for anamantle HC bid prn, tramadol prn, also refer General Surgury

## 2016-12-29 NOTE — Progress Notes (Signed)
Subjective:    Patient ID: Kristie Nelson, female    DOB: 18-Jan-1980, 36 y.o.   MRN: [id number]  HPI  Her with painful hemorrhod x 2-3 days. , mod to severe, constant, worse to sit, walk or almost any moveement.  No fever, drainage or blood.  Did have some nausea and felt chill yesterday. Denies worsening reflux, abd pain, dysphagia, n/v, bowel change or blood. Pt denies chest pain, increased sob or doe, wheezing, orthopnea, PND, increased LE swelling, palpitations, dizziness or syncope. Past Medical History:  Diagnosis Date  . ALLERGIC RHINITIS 04/03/2007  . Anal fissure   . ANXIETY 04/03/2007  . DEPRESSION 04/03/2007  . Dysrhythmia    palpitations due to stress  . ECZEMA 03/19/2009  . Heartburn in pregnancy   . Hemorrhoids   . HYPERLIPIDEMIA 12/09/2009  . HYPERTENSION 04/03/2007  . MIGRAINE, COMMON 12/09/2009  . Postoperative anemia due to acute blood loss 08/30/2012  . Shortness of breath    with anxiety   Past Surgical History:  Procedure Laterality Date  . CESAREAN SECTION  2010  . CESAREAN SECTION N/A 08/29/2012   Procedure: Repeat CESAREAN SECTION;  Surgeon: Lenoard Adenichard J Taavon, MD;  Location: [facility];  Service: Obstetrics;  Laterality: N/A;  EDD: 09/17/12;REQUEST DEE;[name]    reports that  has never smoked. she has never used smokeless tobacco. She reports that she does not drink alcohol or use drugs. family history includes Diabetes in her brother, father, maternal grandmother, and mother; Heart disease in her mother; Hyperlipidemia in her maternal grandmother and mother; Hypertension in her brother, father, maternal grandfather, maternal grandmother, and mother; Liver cancer in her maternal grandfather; Thyroid disease in her maternal grandmother. Allergies  Allergen Reactions  . Latex Itching    swelling  . Peanut-Containing Drug Products     REACTION: throat swelling  . Penicillins     REACTION: itching   Current Outpatient Medications on File Prior to Visit  Medication Sig  Dispense Refill  . AMBULATORY NON FORMULARY MEDICATION Diltiazem 2% mixed with Lidocaine 5%, ratio 1:1  Apply to rectum 2-3 times a day 30 g 2  . amLODipine (NORVASC) 10 MG tablet Take 1 tablet (10 mg total) by mouth daily. 90 tablet 3  . cetirizine (ZYRTEC) 10 MG tablet Take 1 tablet (10 mg total) by mouth daily. (Patient taking differently: Take 10 mg by mouth as needed. ) 30 tablet 11  . cyclobenzaprine (FLEXERIL) 5 MG tablet Take 1 tablet (5 mg total) by mouth 3 (three) times daily as needed for muscle spasms. 30 tablet 2  . diltiazem 2 % GEL Apply 1 application topically 3 (three) times daily. (Patient taking differently: Apply 1 application topically as needed. ) 30 g 3  . lubiprostone (AMITIZA) 24 MCG capsule Take 1 capsule (24 mcg total) by mouth 2 (two) times daily with a meal. (Patient taking differently: Take 24 mcg by mouth daily with breakfast. ) 60 capsule 3  . metoprolol succinate (TOPROL-XL) 50 MG 24 hr tablet Take 1 tablet (50 mg total) by mouth daily. Take with or immediately following a meal. 90 tablet 3  . potassium chloride (K-DUR) 10 MEQ tablet Take 2 tablets (20 mEq total) by mouth daily. 14 tablet 0  . triamcinolone (NASACORT AQ) 55 MCG/ACT AERO nasal inhaler Place 2 sprays into the nose daily. 1 Inhaler 12  . triamcinolone cream (KENALOG) 0.1 % Apply 1 application topically 2 (two) times daily. 30 g 1   No current facility-administered medications on file prior  to visit.    Review of Systems All otherwise neg per pt    Objective:   Physical Exam BP 122/84   Pulse 81   Temp 97.9 F (36.6 C) (Oral)   Ht 5' 2.75" (1.594 m)   Wt 144 lb (65.3 kg)   SpO2 98%   BMI 25.71 kg/m  VS noted,  Constitutional: Pt appears in NAD HENT: Head: NCAT.  Right Ear: External ear normal.  Left Ear: External ear normal.  Eyes: . Pupils are equal, round, and reactive to light. Conjunctivae and EOM are normal Nose: without d/c or deformity Neck: Neck supple. Gross normal  ROM Cardiovascular: Normal rate and regular rhythm.   Pulmonary/Chest: Effort normal and breath sounds without rales or wheezing.  Abd:  Soft, NT, ND, + BS, no organomegaly Rectal:  DRE not done but mod large acutely thrombosed ext hemorrhoid and hemorrhoid tag as well noted Neurological: Pt is alert. At baseline orientation, motor grossly intact Skin: Skin is warm. No rashes, other new lesions, no LE edema Psychiatric: Pt behavior is normal without agitation  No other exam findings    Assessment & Plan:

## 2017-02-20 ENCOUNTER — Encounter: Payer: Self-pay | Admitting: Internal Medicine

## 2017-02-20 ENCOUNTER — Ambulatory Visit: Payer: BC Managed Care – PPO | Admitting: Internal Medicine

## 2017-02-20 VITALS — BP 156/94 | HR 81 | Temp 98.5°F | Resp 16 | Wt 143.0 lb

## 2017-02-20 DIAGNOSIS — J069 Acute upper respiratory infection, unspecified: Secondary | ICD-10-CM | POA: Insufficient documentation

## 2017-02-20 MED ORDER — HYDROCODONE-HOMATROPINE 5-1.5 MG/5ML PO SYRP: 5.0000 mL | ORAL_SOLUTION | Freq: Three times a day (TID) | ORAL | 0 refills | Status: DC | PRN

## 2017-02-20 NOTE — Progress Notes (Signed)
Subjective:    Patient ID: Kristie Nelson, female    DOB: 02-12-1980, 37 y.o.   MRN: [id number]  HPI She is here for an acute visit for cold symptoms.  Her symptoms started 4 days ago  She is experiencing fatigue, ear pain, PND, cough that is productive in the morning, SOB, nausea, lightheadedness and headaches.  She denies fever, sinus pain, sore throat, wheeze, diarrhea and body aches.    She has taken mucinex, benadryl.  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    Medications and allergies reviewed with patient and updated if appropriate.  Patient Active Problem List   Diagnosis Date Noted  . Thrombosed external hemorrhoid 12/29/2016  . Menstrual periods irregular 10/06/2016  . Hypokalemia 10/06/2016  . Hemorrhoids 10/19/2015  . Low back pain 09/14/2015  . Allergic conjunctivitis 01/21/2015  . Low back strain 05/14/2014  . Chronic constipation 03/26/2013  . Abdominal pain, unspecified site 03/26/2013  . Amenorrhea 02/18/2011  . Preventative health care 09/27/2010  . Hyperlipidemia 12/09/2009  . MIGRAINE, COMMON 12/09/2009  . GANGLION CYST, WRIST, LEFT 12/09/2009  . Eczema 03/19/2009  . PARESTHESIA 09/23/2008  . Anxiety state 04/03/2007  . Depression 04/03/2007  . Essential hypertension 04/03/2007  . Allergic rhinitis 04/03/2007    Current Outpatient Medications on File Prior to Visit  Medication Sig Dispense Refill  . AMBULATORY NON FORMULARY MEDICATION Diltiazem 2% mixed with Lidocaine 5%, ratio 1:1  Apply to rectum 2-3 times a day 30 g 2  . amLODipine (NORVASC) 10 MG tablet Take 1 tablet (10 mg total) by mouth daily. 90 tablet 3  . cetirizine (ZYRTEC) 10 MG tablet Take 1 tablet (10 mg total) by mouth daily. (Patient taking differently: Take 10 mg by mouth as needed. ) 30 tablet 11  . cyclobenzaprine (FLEXERIL) 5 MG tablet Take 1 tablet (5 mg total) by mouth 3 (three) times daily as needed for muscle spasms. 30 tablet 2  . diltiazem 2 % GEL Apply 1  application topically 3 (three) times daily. (Patient taking differently: Apply 1 application topically as needed. ) 30 g 3  . Lidocaine-Hydrocortisone Ace 3-2.5 % KIT Use as directed every 12 hrs as needed 1 each 0  . lubiprostone (AMITIZA) 24 MCG capsule Take 1 capsule (24 mcg total) by mouth 2 (two) times daily with a meal. (Patient taking differently: Take 24 mcg by mouth daily with breakfast. ) 60 capsule 3  . metoprolol succinate (TOPROL-XL) 50 MG 24 hr tablet Take 1 tablet (50 mg total) by mouth daily. Take with or immediately following a meal. 90 tablet 3  . potassium chloride (K-DUR) 10 MEQ tablet Take 2 tablets (20 mEq total) by mouth daily. 14 tablet 0  . traMADol (ULTRAM) 50 MG tablet Take 1 tablet (50 mg total) by mouth every 6 (six) hours as needed. 30 tablet 0  . triamcinolone (NASACORT AQ) 55 MCG/ACT AERO nasal inhaler Place 2 sprays into the nose daily. 1 Inhaler 12  . triamcinolone cream (KENALOG) 0.1 % Apply 1 application topically 2 (two) times daily. 30 g 1   No current facility-administered medications on file prior to visit.     Past Medical History:  Diagnosis Date  . ALLERGIC RHINITIS 04/03/2007  . Anal fissure   . ANXIETY 04/03/2007  . DEPRESSION 04/03/2007  . Dysrhythmia    palpitations due to stress  . ECZEMA 03/19/2009  . Heartburn in pregnancy   . Hemorrhoids   . HYPERLIPIDEMIA 12/09/2009  . HYPERTENSION  04/03/2007  . MIGRAINE, COMMON 12/09/2009  . Postoperative anemia due to acute blood loss 08/30/2012  . Shortness of breath    with anxiety    Past Surgical History:  Procedure Laterality Date  . CESAREAN SECTION  2010  . CESAREAN SECTION N/A 08/29/2012   Procedure: Repeat CESAREAN SECTION;  Surgeon: Lovenia Kim, MD;  Location: [place] [facility];  Service: Obstetrics;  Laterality: N/A;  EDD: 09/17/12;REQUEST DEE;[name]    Social History   Socioeconomic History  . Marital status: Married    Spouse name: None  . Number of children: 2  . Years of education:  None  . Highest education level: None  Social Needs  . Financial resource strain: None  . Food insecurity - worry: None  . Food insecurity - inability: None  . Transportation needs - medical: None  . Transportation needs - non-medical: None  Occupational History  . Occupation: TEACHER    Employer: [employer]  Tobacco Use  . Smoking status: Never Smoker  . Smokeless tobacco: Never Used  Substance and Sexual Activity  . Alcohol use: No    Alcohol/week: 0.0 oz  . Drug use: No  . Sexual activity: None  Other Topics Concern  . None  Social History Narrative   Kindergarten teacher - [employer]   Married, 1 son 2010 and 1 daughter 2014   02/25/2016    Family History  Problem Relation Age of Onset  . Diabetes Mother   . Hypertension Mother   . Heart disease Mother   . Hyperlipidemia Mother   . Diabetes Father   . Hypertension Father   . Diabetes Brother   . Hypertension Brother   . Diabetes Maternal Grandmother   . Hypertension Maternal Grandmother   . Thyroid disease Maternal Grandmother   . Hyperlipidemia Maternal Grandmother   . Liver cancer Maternal Grandfather        mets  . Hypertension Maternal Grandfather     Review of Systems  Constitutional: Positive for fatigue. Negative for fever.  HENT: Positive for ear pain and postnasal drip. Negative for congestion, rhinorrhea, sinus pressure, sinus pain, sneezing and sore throat.   Respiratory: Positive for cough (productive in am only) and shortness of breath. Negative for wheezing.   Gastrointestinal: Positive for nausea. Negative for diarrhea.  Musculoskeletal: Negative for myalgias.  Neurological: Positive for light-headedness and headaches. Negative for dizziness.       Objective:   Vitals:   02/20/17 1445  BP: (!) 156/94  Pulse: 81  Resp: 16  Temp: 98.5 F (36.9 C)  SpO2: 98%   Filed Weights   02/20/17 1445  Weight: 143 lb (64.9 kg)   Body mass index is 25.53 kg/m.  Wt Readings from  Last 3 Encounters:  02/20/17 143 lb (64.9 kg)  12/29/16 144 lb (65.3 kg)  10/06/16 145 lb (65.8 kg)     Physical Exam GENERAL APPEARANCE: Appears stated age, well appearing, NAD EYES: conjunctiva clear, no icterus HEENT: bilateral tympanic membranes and ear canals normal, oropharynx with no erythema, no thyromegaly, trachea midline, no cervical or supraclavicular lymphadenopathy LUNGS: Clear to auscultation without wheeze or crackles, unlabored breathing, good air entry bilaterally CARDIOVASCULAR: Normal S1,S2 without murmurs, no edema SKIN: warm, dry        Assessment & Plan:   See Problem List for Assessment and Plan of chronic medical problems.

## 2017-02-20 NOTE — Patient Instructions (Signed)
Continue the mucinex and benadryl as needed.  A prescription cough syrup was sent to your pharmacy for nighttime.    Your prescription(s) have been submitted to your pharmacy or been printed and provided for you. Please take as directed and contact our office if you believe you are having problem(s) with the medication(s) or have any questions.  If your symptoms worsen or fail to improve, please contact our office for further instruction, or in case of emergency go directly to the emergency room at the closest medical facility.   General Recommendations:    Please drink plenty of fluids.  Get plenty of rest   Sleep in humidified air  Use saline nasal sprays  Netti pot  OTC Medications:  Decongestants - helps relieve congestion   Flonase (generic fluticasone) or Nasacort (generic triamcinolone) - please make sure to use the "cross-over" technique at a 45 degree angle towards the opposite eye as opposed to straight up the nasal passageway.   Sudafed (generic pseudoephedrine - Note this is the one that is available behind the pharmacy counter); Products with phenylephrine (-PE) may also be used but is often not as effective as pseudoephedrine.   If you have HIGH BLOOD PRESSURE - Coricidin HBP; AVOID any product that is -D as this contains pseudoephedrine which may increase your blood pressure.  Afrin (oxymetazoline) every 6-8 hours for up to 3 days.  Allergies - helps relieve runny nose, itchy eyes and sneezing   Claritin (generic loratidine), Allegra (fexofenidine), or Zyrtec (generic cyrterizine) for runny nose. These medications should not cause drowsiness.  Note - Benadryl (generic diphenhydramine) may be used however may cause drowsiness  Cough -   Delsym or Robitussin (generic dextromethorphan)  Expectorants - helps loosen mucus to ease removal   Mucinex (generic guaifenesin) as directed on the package.  Headaches / General Aches   Tylenol  (generic acetaminophen) - DO NOT EXCEED 3 grams (3,000 mg) in a 24 hour time period  Advil/Motrin (generic ibuprofen)  Sore Throat -   Salt water gargle   Chloraseptic (generic benzocaine) spray or lozenges / Sucrets (generic dyclonine)

## 2017-02-20 NOTE — Assessment & Plan Note (Signed)
Likely viral in nature with dry cough No antibiotic needed Hycodan cough syrup at nighttime Mucinex, benadryl prn Rest, fluids Not provided for time off work Call if no improvement - may consider an antibiotic

## 2017-07-10 ENCOUNTER — Other Ambulatory Visit (INDEPENDENT_AMBULATORY_CARE_PROVIDER_SITE_OTHER): Payer: BC Managed Care – PPO

## 2017-07-10 ENCOUNTER — Encounter: Payer: Self-pay | Admitting: Internal Medicine

## 2017-07-10 ENCOUNTER — Ambulatory Visit: Payer: Self-pay | Admitting: *Deleted

## 2017-07-10 ENCOUNTER — Ambulatory Visit: Payer: BC Managed Care – PPO | Admitting: Internal Medicine

## 2017-07-10 VITALS — BP 142/92 | HR 81 | Temp 98.7°F | Ht 62.75 in | Wt 146.0 lb

## 2017-07-10 DIAGNOSIS — R002 Palpitations: Secondary | ICD-10-CM

## 2017-07-10 DIAGNOSIS — N926 Irregular menstruation, unspecified: Secondary | ICD-10-CM

## 2017-07-10 DIAGNOSIS — R202 Paresthesia of skin: Secondary | ICD-10-CM | POA: Diagnosis not present

## 2017-07-10 DIAGNOSIS — E876 Hypokalemia: Secondary | ICD-10-CM

## 2017-07-10 DIAGNOSIS — I10 Essential (primary) hypertension: Secondary | ICD-10-CM | POA: Diagnosis not present

## 2017-07-10 DIAGNOSIS — H1013 Acute atopic conjunctivitis, bilateral: Secondary | ICD-10-CM

## 2017-07-10 DIAGNOSIS — F411 Generalized anxiety disorder: Secondary | ICD-10-CM | POA: Diagnosis not present

## 2017-07-10 LAB — HEPATIC FUNCTION PANEL
ALBUMIN: 4.5 g/dL (ref 3.5–5.2)
ALK PHOS: 47 U/L (ref 39–117)
ALT: 10 U/L (ref 0–35)
AST: 15 U/L (ref 0–37)
BILIRUBIN DIRECT: 0 mg/dL (ref 0.0–0.3)
Total Bilirubin: 0.4 mg/dL (ref 0.2–1.2)
Total Protein: 7.7 g/dL (ref 6.0–8.3)

## 2017-07-10 LAB — CBC WITH DIFFERENTIAL/PLATELET
BASOS PCT: 0.8 % (ref 0.0–3.0)
Basophils Absolute: 0.1 10*3/uL (ref 0.0–0.1)
EOS ABS: 0.2 10*3/uL (ref 0.0–0.7)
Eosinophils Relative: 2.1 % (ref 0.0–5.0)
HCT: 37.7 % (ref 36.0–46.0)
Hemoglobin: 12.8 g/dL (ref 12.0–15.0)
LYMPHS ABS: 2.3 10*3/uL (ref 0.7–4.0)
Lymphocytes Relative: 28.6 % (ref 12.0–46.0)
MCHC: 34.1 g/dL (ref 30.0–36.0)
MCV: 89.3 fl (ref 78.0–100.0)
Monocytes Absolute: 0.4 10*3/uL (ref 0.1–1.0)
Monocytes Relative: 5.1 % (ref 3.0–12.0)
NEUTROS ABS: 5.2 10*3/uL (ref 1.4–7.7)
NEUTROS PCT: 63.4 % (ref 43.0–77.0)
PLATELETS: 316 10*3/uL (ref 150.0–400.0)
RBC: 4.22 Mil/uL (ref 3.87–5.11)
RDW: 13.5 % (ref 11.5–15.5)
WBC: 8.2 10*3/uL (ref 4.0–10.5)

## 2017-07-10 LAB — BASIC METABOLIC PANEL
BUN: 9 mg/dL (ref 6–23)
CHLORIDE: 101 meq/L (ref 96–112)
CO2: 30 meq/L (ref 19–32)
CREATININE: 0.73 mg/dL (ref 0.40–1.20)
Calcium: 9.4 mg/dL (ref 8.4–10.5)
GFR: 113.95 mL/min (ref >60.00)
Glucose, Bld: 94 mg/dL (ref 70–99)
POTASSIUM: 2.9 meq/L — AB (ref 3.5–5.1)
Sodium: 139 mEq/L (ref 135–145)

## 2017-07-10 MED ORDER — AZELASTINE HCL 0.05 % OP SOLN: 1.0000 [drp] | Freq: Two times a day (BID) | OPHTHALMIC | 12 refills | Status: DC

## 2017-07-10 NOTE — Assessment & Plan Note (Signed)
Mild elevated today likely situational,  Cont same tx, to f/u any worsening symptoms or concerns\

## 2017-07-10 NOTE — Assessment & Plan Note (Signed)
Also for f/u K today with labs 

## 2017-07-10 NOTE — Assessment & Plan Note (Signed)
Also for cbc with labs today

## 2017-07-10 NOTE — Assessment & Plan Note (Signed)
Mild to mod, for optivar asd,  to f/u any worsening symptoms or concerns 

## 2017-07-10 NOTE — Patient Instructions (Addendum)
Your EKG was normal today, and we can consider a heart monitor if your symptoms return  Please take all new medication as prescribed - the eye drops  Please continue all other medications as before, and refills have been done if requested.  Please have the pharmacy call with any other refills you may need.  Please keep your appointments with your specialists as you may have planned  Please go to the LAB in the Basement (turn left off the elevator) for the tests to be done today  You will be contacted by phone if any changes need to be made immediately.  Otherwise, you will receive a letter about your results with an explanation, but please check with MyChart first.  Please remember to sign up for MyChart if you have not done so, as this will be important to you in the future with finding out test results, communicating by private email, and scheduling acute appointments online when needed.

## 2017-07-10 NOTE — Assessment & Plan Note (Signed)
I suspect possible anxiety related as well, also for b12 with labs

## 2017-07-10 NOTE — Telephone Encounter (Signed)
Pt reports palpitations x 2 weeks. Last several minutes when occur. Reports tingling both hands, "Sometimes legs and feet" with palpitations. Also reports "mild" dizziness with palpitations. Denies CP, SOB. HR during call 64. States occur frequently at times, other days not as frequent; "Random."  States "Not much caffeine," does not increase with exertion. States similar symptoms "Years ago" when K+ was low. Appt made with Dr. Jonny RuizJohn for today. Care advise given per protocol.   Reason for Disposition . [1] Palpitations AND [2] no improvement after using CARE ADVICE    With dizziness, tingling hands when occurs  Answer Assessment - Initial Assessment Questions 1. DESCRIPTION: "Please describe your heart rate or heart beat that you are having" (e.g., fast/slow, regular/irregular, skipped or extra beats, "palpitations")     Palpitations 2. ONSET: "When did it start?" (Minutes, hours or days)      2 weeks ago 3. DURATION: "How long does it last" (e.g., seconds, minutes, hours)     Couple minutes 4. PATTERN "Does it come and go, or has it been constant since it started?"  "Does it get worse with exertion?"   "Are you feeling it now?"     Not worse 5. TAP: "Using your hand, can you tap out what you are feeling on a chair or table in front of you, so that I can hear?" (Note: not all patients can do this)       no 6. HEART RATE: "Can you tell me your heart rate?" "How many beats in 15 seconds?"  (Note: not all patients can do this)      64 7. RECURRENT SYMPTOM: "Have you ever had this before?" If so, ask: "When was the last time?" and "What happened that time?"      no 8. CAUSE: "What do you think is causing the palpitations?"     unsure 9. CARDIAC HISTORY: "Do you have any history of heart disease?" (e.g., heart attack, angina, bypass surgery, angioplasty, arrhythmia)      no 10. OTHER SYMPTOMS: "Do you have any other symptoms?" (e.g., dizziness, chest pain, sweating, difficulty breathing)  Tingling hands and legs to feet at times, had before when K was low 11. PREGNANCY: "Is there any chance you are pregnant?" "When was your last menstrual period?"       *No Answer*  Protocols used: HEART RATE AND HEARTBEAT QUESTIONS-A-AH

## 2017-07-10 NOTE — Progress Notes (Signed)
Subjective:    Patient ID: Kristie Nelson, female    DOB: 03/19/1980, 37 y.o.   MRN: [id number]  HPI  Here with c/o bilat hand numbness and feet numb with just sitting doing nothing, mild, intermittent, nothing really makes better or worse, sort of painful at times but otherwise hard to characterize, and no grip loss.   Also c/o 2-3 days heart palpitations, sort of period of increased beats and fast, assoc with chest pressure, but without sob, diaphoresis, n/v, or syncope,  Did have some mild lightheaded.  Has only occas colas with caffeine once per wk only. Has irregular menses but no real change in volume or days menses.  Denies hyper or hypo thyroid symptoms such as voice, skin or hair change.Has had persistent mild low K by labs recently.  Denies worsening depressive symptoms, suicidal ideation, or panic; has ongoing anxiety. Also c/o bilat eye itching and watering for 3 wks, mild to mod occas severe, has not tried any otc or other med,  Has not had significant nasal/sinus allergy symptoms Past Medical History:  Diagnosis Date  . ALLERGIC RHINITIS 04/03/2007  . Anal fissure   . ANXIETY 04/03/2007  . DEPRESSION 04/03/2007  . Dysrhythmia    palpitations due to stress  . ECZEMA 03/19/2009  . Heartburn in pregnancy   . Hemorrhoids   . HYPERLIPIDEMIA 12/09/2009  . HYPERTENSION 04/03/2007  . MIGRAINE, COMMON 12/09/2009  . Postoperative anemia due to acute blood loss 08/30/2012  . Shortness of breath    with anxiety   Past Surgical History:  Procedure Laterality Date  . CESAREAN SECTION  2010  . CESAREAN SECTION N/A 08/29/2012   Procedure: Repeat CESAREAN SECTION;  Surgeon: Lovenia Kim, MD;  Location: [facility];  Service: Obstetrics;  Laterality: N/A;  EDD: 09/17/12;REQUEST DEE;[name]    reports that she has never smoked. She has never used smokeless tobacco. She reports that she does not drink alcohol or use drugs. family history includes Diabetes in her brother, father, maternal  grandmother, and mother; Heart disease in her mother; Hyperlipidemia in her maternal grandmother and mother; Hypertension in her brother, father, maternal grandfather, maternal grandmother, and mother; Liver cancer in her maternal grandfather; Thyroid disease in her maternal grandmother. Allergies  Allergen Reactions  . Latex Itching    swelling  . Peanut-Containing Drug Products     REACTION: throat swelling  . Penicillins     REACTION: itching   Current Outpatient Medications on File Prior to Visit  Medication Sig Dispense Refill  . AMBULATORY NON FORMULARY MEDICATION Diltiazem 2% mixed with Lidocaine 5%, ratio 1:1  Apply to rectum 2-3 times a day 30 g 2  . amLODipine (NORVASC) 10 MG tablet Take 1 tablet (10 mg total) by mouth daily. 90 tablet 3  . cetirizine (ZYRTEC) 10 MG tablet Take 1 tablet (10 mg total) by mouth daily. (Patient taking differently: Take 10 mg by mouth as needed. ) 30 tablet 11  . cyclobenzaprine (FLEXERIL) 5 MG tablet Take 1 tablet (5 mg total) by mouth 3 (three) times daily as needed for muscle spasms. 30 tablet 2  . diltiazem 2 % GEL Apply 1 application topically 3 (three) times daily. (Patient taking differently: Apply 1 application topically as needed. ) 30 g 3  . HYDROcodone-homatropine (HYCODAN) 5-1.5 MG/5ML syrup Take 5 mLs by mouth every 8 (eight) hours as needed for cough. 120 mL 0  . Lidocaine-Hydrocortisone Ace 3-2.5 % KIT Use as directed every 12 hrs as needed 1 each  0  . lubiprostone (AMITIZA) 24 MCG capsule Take 1 capsule (24 mcg total) by mouth 2 (two) times daily with a meal. (Patient taking differently: Take 24 mcg by mouth daily with breakfast. ) 60 capsule 3  . metoprolol succinate (TOPROL-XL) 50 MG 24 hr tablet Take 1 tablet (50 mg total) by mouth daily. Take with or immediately following a meal. 90 tablet 3  . potassium chloride (K-DUR) 10 MEQ tablet Take 2 tablets (20 mEq total) by mouth daily. 14 tablet 0  . traMADol (ULTRAM) 50 MG tablet Take 1  tablet (50 mg total) by mouth every 6 (six) hours as needed. 30 tablet 0  . triamcinolone (NASACORT AQ) 55 MCG/ACT AERO nasal inhaler Place 2 sprays into the nose daily. 1 Inhaler 12  . triamcinolone cream (KENALOG) 0.1 % Apply 1 application topically 2 (two) times daily. 30 g 1   No current facility-administered medications on file prior to visit.    Review of Systems  Constitutional: Negative for other unusual diaphoresis or sweats HENT: Negative for ear discharge or swelling Eyes: Negative for other worsening visual disturbances Respiratory: Negative for stridor or other swelling  Gastrointestinal: Negative for worsening distension or other blood Genitourinary: Negative for retention or other urinary change Musculoskeletal: Negative for other MSK pain or swelling Skin: Negative for color change or other new lesions Neurological: Negative for worsening tremors and other numbness  Psychiatric/Behavioral: Negative for worsening agitation or other fatigue All other system neg per pt    Objective:   Physical Exam BP (!) 142/92   Pulse 81   Temp 98.7 F (37.1 C) (Oral)   Ht 5' 2.75" (1.594 m)   Wt 146 lb (66.2 kg)   SpO2 96%   BMI 26.07 kg/m  VS noted, not ill appearing Constitutional: Pt appears in NAD HENT: Head: NCAT.  Right Ear: External ear normal.  Left Ear: External ear normal.  Eyes: . Pupils are equal, round, and reactive to light. Conjunctivae with bilat erythema and watery dc, wearing sunglasses indoors, and and EOM are normal Nose: without d/c or deformity Neck: Neck supple. Gross normal ROM Cardiovascular: Normal rate and regular rhythm.   Pulmonary/Chest: Effort normal and breath sounds without rales or wheezing.  Abd:  Soft, NT, ND, + BS, no organomegaly Neurological: Pt is alert. At baseline orientation, motor grossly intact Skin: Skin is warm. No rashes, other new lesions, no LE edema Psychiatric: Pt behavior is normal without agitation , 1+ nervous No other  exam findings  ECG today I have personally interpreted NSR - on arrythmia or ischemia noted    Assessment & Plan:

## 2017-07-10 NOTE — Assessment & Plan Note (Signed)
Ongoing mild to mod, denies worsening but is persistent chronic, declines referral counseling or med change

## 2017-07-10 NOTE — Assessment & Plan Note (Addendum)
Etiology unclear, ecg reviewed, for labs as ordered, ? Anxiety related  Note:  Total time for pt hx, exam, review of record with pt in the room, determination of diagnoses and plan for further eval and tx is > 40 min, with over 50% spent in coordination and counseling of patient including the differential dx, tx, further evaluation and other management of palpitations, paresthesia, HTN, anxiety, allergic conjunctivitis, Hypokalemia, irreg menses

## 2017-07-11 ENCOUNTER — Other Ambulatory Visit (INDEPENDENT_AMBULATORY_CARE_PROVIDER_SITE_OTHER): Payer: BC Managed Care – PPO

## 2017-07-11 ENCOUNTER — Encounter: Payer: Self-pay | Admitting: Internal Medicine

## 2017-07-11 ENCOUNTER — Other Ambulatory Visit: Payer: Self-pay | Admitting: Internal Medicine

## 2017-07-11 ENCOUNTER — Telehealth: Payer: Self-pay

## 2017-07-11 DIAGNOSIS — Z0000 Encounter for general adult medical examination without abnormal findings: Secondary | ICD-10-CM

## 2017-07-11 DIAGNOSIS — E876 Hypokalemia: Secondary | ICD-10-CM

## 2017-07-11 LAB — URINALYSIS, ROUTINE W REFLEX MICROSCOPIC
BILIRUBIN URINE: NEGATIVE
KETONES UR: NEGATIVE
NITRITE: NEGATIVE
Specific Gravity, Urine: 1.01 (ref 1.000–1.030)
TOTAL PROTEIN, URINE-UPE24: NEGATIVE
UROBILINOGEN UA: 0.2 (ref 0.0–1.0)
Urine Glucose: NEGATIVE
pH: 8 (ref 5.0–8.0)

## 2017-07-11 LAB — CBC WITH DIFFERENTIAL/PLATELET
BASOS ABS: 0.1 10*3/uL (ref 0.0–0.1)
Basophils Relative: 0.8 % (ref 0.0–3.0)
EOS ABS: 0.1 10*3/uL (ref 0.0–0.7)
Eosinophils Relative: 2 % (ref 0.0–5.0)
HEMATOCRIT: 36.3 % (ref 36.0–46.0)
Hemoglobin: 12.4 g/dL (ref 12.0–15.0)
LYMPHS PCT: 30.5 % (ref 12.0–46.0)
Lymphs Abs: 2.2 10*3/uL (ref 0.7–4.0)
MCHC: 34.1 g/dL (ref 30.0–36.0)
MCV: 89.5 fl (ref 78.0–100.0)
MONO ABS: 0.4 10*3/uL (ref 0.1–1.0)
Monocytes Relative: 5.2 % (ref 3.0–12.0)
Neutro Abs: 4.5 10*3/uL (ref 1.4–7.7)
Neutrophils Relative %: 61.5 % (ref 43.0–77.0)
PLATELETS: 301 10*3/uL (ref 150.0–400.0)
RBC: 4.05 Mil/uL (ref 3.87–5.11)
RDW: 13.3 % (ref 11.5–15.5)
WBC: 7.3 10*3/uL (ref 4.0–10.5)

## 2017-07-11 LAB — LIPID PANEL
CHOL/HDL RATIO: 5
CHOLESTEROL: 188 mg/dL (ref 0–200)
HDL: 40.9 mg/dL (ref >39.00)
NonHDL: 147.55
TRIGLYCERIDES: 205 mg/dL — AB (ref 0.0–149.0)
VLDL: 41 mg/dL — AB (ref 0.0–40.0)

## 2017-07-11 LAB — LDL CHOLESTEROL, DIRECT: Direct LDL: 127 mg/dL

## 2017-07-11 LAB — BASIC METABOLIC PANEL
BUN: 9 mg/dL (ref 6–23)
CHLORIDE: 101 meq/L (ref 96–112)
CO2: 32 mEq/L (ref 19–32)
Calcium: 9.3 mg/dL (ref 8.4–10.5)
Creatinine, Ser: 0.68 mg/dL (ref 0.40–1.20)
GFR: 123.67 mL/min (ref >60.00)
Glucose, Bld: 92 mg/dL (ref 70–99)
POTASSIUM: 2.9 meq/L — AB (ref 3.5–5.1)
SODIUM: 139 meq/L (ref 135–145)

## 2017-07-11 LAB — HEPATIC FUNCTION PANEL
ALBUMIN: 4.3 g/dL (ref 3.5–5.2)
ALT: 10 U/L (ref 0–35)
AST: 14 U/L (ref 0–37)
Alkaline Phosphatase: 45 U/L (ref 39–117)
Bilirubin, Direct: 0.1 mg/dL (ref 0.0–0.3)
TOTAL PROTEIN: 7.7 g/dL (ref 6.0–8.3)
Total Bilirubin: 0.4 mg/dL (ref 0.2–1.2)

## 2017-07-11 LAB — VITAMIN B12: VITAMIN B 12: 273 pg/mL (ref 211–911)

## 2017-07-11 LAB — TSH
TSH: 0.7 u[IU]/mL (ref 0.35–4.50)
TSH: 0.65 u[IU]/mL (ref 0.35–4.50)

## 2017-07-11 MED ORDER — POTASSIUM CHLORIDE ER 10 MEQ PO TBCR: 10.0000 meq | EXTENDED_RELEASE_TABLET | Freq: Every day | ORAL | 3 refills | Status: DC

## 2017-07-11 NOTE — Telephone Encounter (Signed)
-----   Message from Corwin LevinsJames W John, MD sent at 07/11/2017 12:48 PM EDT ----- Left message on MyChart, pt to cont same tx except  Correction - I though to start that you were taking the potassium at 20 meq per day because the nurse indicated this at your intake evaluation prior to your last visit (by not correcting whether you were taking).  I realize now that you are not likely taking any potassium as the number of pills was only for 5 days at your last prescription  This means you are not taking potassium that was indicated at your last visit.  Instead of increasing the potassium from 20 to 30 meq per day, we need to simply start 10 meq per day (and take every day) but please wait to have your testing done first at the lab.

## 2017-07-11 NOTE — Telephone Encounter (Signed)
Pt has viewed results on MyChart. 

## 2017-07-12 ENCOUNTER — Encounter: Payer: Self-pay | Admitting: Internal Medicine

## 2017-07-13 ENCOUNTER — Encounter: Payer: Self-pay | Admitting: Internal Medicine

## 2017-07-13 LAB — TIQ-MISC

## 2017-07-15 LAB — ALDOSTERONE + RENIN ACTIVITY W/ RATIO
ALDO / PRA Ratio: 220 Ratio — ABNORMAL HIGH (ref 0.9–28.9)
Aldosterone: 11 ng/dL
Renin Activity: 0.05 ng/mL/h — ABNORMAL LOW (ref 0.25–5.82)

## 2017-07-15 LAB — POTASSIUM, URINE, 24 HOUR: POTASSIUM 24H UR: 20 mmol/(24.h) — AB (ref 22–160)

## 2017-07-25 ENCOUNTER — Other Ambulatory Visit: Payer: Self-pay | Admitting: Internal Medicine

## 2017-07-25 DIAGNOSIS — E2609 Other primary hyperaldosteronism: Secondary | ICD-10-CM

## 2017-07-26 ENCOUNTER — Telehealth: Payer: Self-pay

## 2017-07-26 NOTE — Telephone Encounter (Signed)
-----   Message from Corwin LevinsJames W John, MD sent at 07/25/2017  7:45 PM EDT ----- Left message on MyChart, pt to cont same tx except  The test results show that your current treatment is OK, except the Aldosterone to Renin ratio is quite high.  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  To confirm this and consider any further evaluation or treatment, we should refer you to Endocrinology.  I will do this, and you should hear from the office as well.    Shirron to please inform pt, I will do referral/order/rx

## 2017-07-26 NOTE — Telephone Encounter (Signed)
Pt has viewed results via MyChart  

## 2017-08-24 ENCOUNTER — Other Ambulatory Visit: Payer: Self-pay | Admitting: Internal Medicine

## 2017-09-05 ENCOUNTER — Other Ambulatory Visit: Payer: Self-pay | Admitting: Internal Medicine

## 2017-09-18 ENCOUNTER — Encounter: Payer: Self-pay | Admitting: Endocrinology

## 2017-09-18 ENCOUNTER — Ambulatory Visit: Payer: BC Managed Care – PPO | Admitting: Endocrinology

## 2017-09-18 VITALS — BP 130/90 | HR 86 | Ht 62.75 in | Wt 151.2 lb

## 2017-09-18 DIAGNOSIS — E876 Hypokalemia: Secondary | ICD-10-CM

## 2017-09-18 DIAGNOSIS — I10 Essential (primary) hypertension: Secondary | ICD-10-CM

## 2017-09-18 MED ORDER — DEXAMETHASONE 1 MG PO TABS: ORAL_TABLET | ORAL | 0 refills | Status: DC

## 2017-09-18 NOTE — Patient Instructions (Addendum)
You should do a "dexamethasone suppression test."  For this, you would take dexamethasone 1 mg at 10 pm (I have sent a prescription to your pharmacy), then come in for the blood tests the next morning before 9 am.  You do not need to be fasting for this test.  Please continue the same medications for now.   Please come back for a follow-up appointment in 1 month.

## 2017-09-18 NOTE — Progress Notes (Signed)
Subjective:    Patient ID: Kristie Nelson, female    DOB: [birth year], 37 y.o.   MRN: [id number]  HPI Pt is ref by Dr Jenny Reichmann, for hyperaldosteronism. This was found in 2019.  She was dx'ed with HTN in 1999, and hypokalemia was noted in 2016.   she has no h/o adrenal disease, kidney problems, or cancer.  She has slight tingling of the hands, and assoc fatigue.   Past Medical History:  Diagnosis Date  . ALLERGIC RHINITIS 04/03/2007  . Anal fissure   . ANXIETY 04/03/2007  . DEPRESSION 04/03/2007  . Dysrhythmia    palpitations due to stress  . ECZEMA 03/19/2009  . Heartburn in pregnancy   . Hemorrhoids   . HYPERLIPIDEMIA 12/09/2009  . HYPERTENSION 04/03/2007  . MIGRAINE, COMMON 12/09/2009  . Postoperative anemia due to acute blood loss 08/30/2012  . Shortness of breath    with anxiety    Past Surgical History:  Procedure Laterality Date  . CESAREAN SECTION  2010  . CESAREAN SECTION N/A 08/29/2012   Procedure: Repeat CESAREAN SECTION;  Surgeon: Lovenia Kim, MD;  Location: [facility];  Service: Obstetrics;  Laterality: N/A;  EDD: 09/17/12;REQUEST DEE;[name]    Social History   Socioeconomic History  . Marital status: Married    Spouse name: Not on file  . Number of children: 2  . Years of education: Not on file  . Highest education level: Not on file  Occupational History  . Occupation: TEACHER    Employer: [employer]  Social Needs  . Financial resource strain: Not on file  . Food insecurity:    Worry: Not on file    Inability: Not on file  . Transportation needs:    Medical: Not on file    Non-medical: Not on file  Tobacco Use  . Smoking status: Never Smoker  . Smokeless tobacco: Never Used  Substance and Sexual Activity  . Alcohol use: No    Alcohol/week: 0.0 standard drinks  . Drug use: No  . Sexual activity: Not on file  Lifestyle  . Physical activity:    Days per week: Not on file    Minutes per session: Not on file  . Stress: Not on file  Relationships  .  Social connections:    Talks on phone: Not on file    Gets together: Not on file    Attends religious service: Not on file    Active member of club or organization: Not on file    Attends meetings of clubs or organizations: Not on file    Relationship status: Not on file  . Intimate partner violence:    Fear of current or ex partner: Not on file    Emotionally abused: Not on file    Physically abused: Not on file    Forced sexual activity: Not on file  Other Topics Concern  . Not on file  Social History Narrative   Kindergarten teacher - [employer]   Married, 1 son 2010 and 1 daughter 2014   02/25/2016    Current Outpatient Medications on File Prior to Visit  Medication Sig Dispense Refill  . AMBULATORY NON FORMULARY MEDICATION Diltiazem 2% mixed with Lidocaine 5%, ratio 1:1  Apply to rectum 2-3 times a day 30 g 2  . amLODipine (NORVASC) 10 MG tablet TAKE 1 TABLET BY MOUTH ONCE DAILY 90 tablet 3  . azelastine (OPTIVAR) 0.05 % ophthalmic solution Place 1 drop into both eyes 2 (two) times daily. 6  mL 12  . cetirizine (ZYRTEC) 10 MG tablet Take 1 tablet (10 mg total) by mouth daily. (Patient taking differently: Take 10 mg by mouth as needed. ) 30 tablet 11  . cyclobenzaprine (FLEXERIL) 5 MG tablet TAKE 1 TABLET BY MOUTH THREE TIMES DAILY AS NEEDED FOR MUSCLE SPASM 30 tablet 2  . diltiazem 2 % GEL Apply 1 application topically 3 (three) times daily. (Patient taking differently: Apply 1 application topically as needed. ) 30 g 3  . Lidocaine-Hydrocortisone Ace 3-2.5 % KIT Use as directed every 12 hrs as needed 1 each 0  . lubiprostone (AMITIZA) 24 MCG capsule Take 1 capsule (24 mcg total) by mouth 2 (two) times daily with a meal. (Patient taking differently: Take 24 mcg by mouth daily with breakfast. ) 60 capsule 3  . potassium chloride (K-DUR) 10 MEQ tablet Take 1 tablet (10 mEq total) by mouth daily. 90 tablet 3  . traMADol (ULTRAM) 50 MG tablet Take 1 tablet (50 mg total) by mouth  every 6 (six) hours as needed. 30 tablet 0  . triamcinolone (NASACORT AQ) 55 MCG/ACT AERO nasal inhaler Place 2 sprays into the nose daily. 1 Inhaler 12  . triamcinolone cream (KENALOG) 0.1 % Apply 1 application topically 2 (two) times daily. 30 g 1   No current facility-administered medications on file prior to visit.     Allergies  Allergen Reactions  . Latex Itching    swelling  . Peanut-Containing Drug Products     REACTION: throat swelling  . Penicillins     REACTION: itching    Family History  Problem Relation Age of Onset  . Diabetes Mother   . Hypertension Mother   . Heart disease Mother   . Hyperlipidemia Mother   . Diabetes Father   . Hypertension Father   . Diabetes Brother   . Hypertension Brother   . Diabetes Maternal Grandmother   . Hypertension Maternal Grandmother   . Thyroid disease Maternal Grandmother   . Hyperlipidemia Maternal Grandmother   . Liver cancer Maternal Grandfather        mets  . Hypertension Maternal Grandfather   . Other Neg Hx        hyperaldosteronism    BP 130/90 (BP Location: Right Arm)   Pulse 86   Ht 5' 2.75" (1.594 m)   Wt 151 lb 3.2 oz (68.6 kg)   LMP 09/09/2017   SpO2 95%   BMI 27.00 kg/m     Review of Systems she denies polyuria, numbness, n/v, headache, blurry vision, weight change, anxiety, and muscle weakness.  She has leg cramps.       Objective:   Physical Exam VS: see vs page GEN: no distress HEAD: head: no deformity eyes: no periorbital swelling, no proptosis external nose and ears are normal mouth: no lesion seen NECK: supple, thyroid is not enlarged CHEST WALL: no deformity LUNGS: clear to auscultation CV: reg rate and rhythm, no murmur ABD: abdomen is soft, nontender.  no hepatosplenomegaly.  not distended.  no hernia MUSCULOSKELETAL: muscle bulk and strength are grossly normal.  no obvious joint swelling.  gait is normal and steady EXTEMITIES: no deformity.  no ulcer on the feet.  feet are of  normal color and temp.  no edema PULSES: dorsalis pedis intact bilat.  no carotid bruit NEURO:  cn 2-12 grossly intact.   readily moves all 4's.  sensation is intact to touch on the feet SKIN:  Normal texture and temperature.  No rash or suspicious  lesion is visible.   NODES:  None palpable at the neck PSYCH: alert, well-oriented.  Does not appear anxious nor depressed.  outside test results are reviewed: Aldosterone=11 PRA=0.05  I have reviewed outside records, and summarized: Pt was noted to have elevated aldo/PRA ratio, and referred here.  She was rx'ed BP meds and KLOR.     Assessment & Plan:  Elevated aldo/PRA ratio: there os controversy as to whether this is clinically significant with aldo well within the normal range.    Patient Instructions  You should do a "dexamethasone suppression test."  For this, you would take dexamethasone 1 mg at 10 pm (I have sent a prescription to your pharmacy), then come in for the blood tests the next morning before 9 am.  You do not need to be fasting for this test.  Please continue the same medications for now.   Please come back for a follow-up appointment in 1 month.

## 2017-09-19 ENCOUNTER — Other Ambulatory Visit: Payer: Self-pay | Admitting: Internal Medicine

## 2017-09-21 ENCOUNTER — Other Ambulatory Visit (INDEPENDENT_AMBULATORY_CARE_PROVIDER_SITE_OTHER): Payer: BC Managed Care – PPO

## 2017-09-21 DIAGNOSIS — E876 Hypokalemia: Secondary | ICD-10-CM

## 2017-09-21 DIAGNOSIS — I10 Essential (primary) hypertension: Secondary | ICD-10-CM | POA: Diagnosis not present

## 2017-09-21 LAB — BASIC METABOLIC PANEL
BUN: 16 mg/dL (ref 6–23)
CALCIUM: 9.6 mg/dL (ref 8.4–10.5)
CO2: 29 mEq/L (ref 19–32)
Chloride: 102 mEq/L (ref 96–112)
Creatinine, Ser: 0.84 mg/dL (ref 0.40–1.20)
GFR: 96.81 mL/min (ref >60.00)
GLUCOSE: 101 mg/dL — AB (ref 70–99)
Potassium: 3.1 mEq/L — ABNORMAL LOW (ref 3.5–5.1)
Sodium: 139 mEq/L (ref 135–145)

## 2017-09-21 LAB — CORTISOL: Cortisol, Plasma: 0.6 ug/dL

## 2017-09-21 LAB — MAGNESIUM: MAGNESIUM: 2.1 mg/dL (ref 1.5–2.5)

## 2017-09-27 LAB — ALDOSTERONE + RENIN ACTIVITY W/ RATIO
ALDO / PRA Ratio: 66.7 Ratio — ABNORMAL HIGH (ref 0.9–28.9)
Aldosterone: 8 ng/dL
Renin Activity: 0.12 ng/mL/h — ABNORMAL LOW (ref 0.25–5.82)

## 2017-10-25 ENCOUNTER — Ambulatory Visit: Payer: BC Managed Care – PPO | Admitting: Endocrinology

## 2017-10-29 ENCOUNTER — Encounter: Payer: Self-pay | Admitting: Endocrinology

## 2017-11-07 ENCOUNTER — Encounter: Payer: Self-pay | Admitting: Internal Medicine

## 2017-11-08 ENCOUNTER — Encounter: Payer: Self-pay | Admitting: Internal Medicine

## 2017-11-08 NOTE — Telephone Encounter (Signed)
Staff/shirron to assist patient with making appt with Sports medicine for left knee pain, please

## 2018-03-26 ENCOUNTER — Encounter: Payer: Self-pay | Admitting: Internal Medicine

## 2018-03-26 MED ORDER — TRIAMCINOLONE ACETONIDE 0.1 % EX CREA: 1.0000 "application " | TOPICAL_CREAM | Freq: Two times a day (BID) | CUTANEOUS | 2 refills | Status: DC

## 2018-05-16 ENCOUNTER — Encounter: Payer: Self-pay | Admitting: Internal Medicine

## 2018-05-22 ENCOUNTER — Encounter: Payer: Self-pay | Admitting: Internal Medicine

## 2018-05-22 ENCOUNTER — Ambulatory Visit (INDEPENDENT_AMBULATORY_CARE_PROVIDER_SITE_OTHER): Payer: BC Managed Care – PPO | Admitting: Internal Medicine

## 2018-05-22 DIAGNOSIS — I10 Essential (primary) hypertension: Secondary | ICD-10-CM

## 2018-05-22 DIAGNOSIS — J309 Allergic rhinitis, unspecified: Secondary | ICD-10-CM

## 2018-05-22 DIAGNOSIS — H1013 Acute atopic conjunctivitis, bilateral: Secondary | ICD-10-CM

## 2018-05-22 MED ORDER — TRIAMCINOLONE ACETONIDE 55 MCG/ACT NA AERO: 2.0000 | INHALATION_SPRAY | Freq: Every day | NASAL | 12 refills | Status: DC

## 2018-05-22 MED ORDER — AZELASTINE HCL 0.05 % OP SOLN: 1.0000 [drp] | Freq: Two times a day (BID) | OPHTHALMIC | 12 refills | Status: DC

## 2018-05-22 MED ORDER — PREDNISONE 10 MG PO TABS: ORAL_TABLET | ORAL | 0 refills | Status: DC

## 2018-05-22 NOTE — Progress Notes (Signed)
Patient ID: Kristie Nelson, female   DOB: [birth year], 38 y.o.   MRN: [id number]  Virtual Visit via Video Note  I connected with [name] on 05/22/18 at  3:40 PM EDT by a video enabled telemedicine application and verified that I am speaking with the correct person using two identifiers.  Location: Patient: at home Provider: at office   I discussed the limitations of evaluation and management by telemedicine and the availability of in person appointments. The patient expressed understanding and agreed to proceed.  History of Present Illness: Here with c/o several wks ongoing nasal allergy symptoms as well as eye redness and weepy with clearish congestion, itch and sneezing, without fever, pain, ST, cough, swelling or wheezing.  Pt denies chest pain, increased sob or doe, wheezing, orthopnea, PND, increased LE swelling, palpitations, dizziness or syncope.  Pt denies new neurological symptoms such as new headache, or facial or extremity weakness or numbness   Pt denies polydipsia, polyuria.     Past Medical History:  Diagnosis Date  . ALLERGIC RHINITIS 04/03/2007  . Anal fissure   . ANXIETY 04/03/2007  . DEPRESSION 04/03/2007  . Dysrhythmia    palpitations due to stress  . ECZEMA 03/19/2009  . Heartburn in pregnancy   . Hemorrhoids   . HYPERLIPIDEMIA 12/09/2009  . HYPERTENSION 04/03/2007  . MIGRAINE, COMMON 12/09/2009  . Postoperative anemia due to acute blood loss 08/30/2012  . Shortness of breath    with anxiety   Past Surgical History:  Procedure Laterality Date  . CESAREAN SECTION  2010  . CESAREAN SECTION N/A 08/29/2012   Procedure: Repeat CESAREAN SECTION;  Surgeon: Lovenia Kim, MD;  Location: [facility];  Service: Obstetrics;  Laterality: N/A;  EDD: 09/17/12;REQUEST DEE;[name]    reports that she has never smoked. She has never used smokeless tobacco. She reports that she does not drink alcohol or use drugs. family history includes Diabetes in her brother, father, maternal  grandmother, and mother; Heart disease in her mother; Hyperlipidemia in her maternal grandmother and mother; Hypertension in her brother, father, maternal grandfather, maternal grandmother, and mother; Liver cancer in her maternal grandfather; Thyroid disease in her maternal grandmother. Allergies  Allergen Reactions  . Latex Itching    swelling  . Peanut-Containing Drug Products     REACTION: throat swelling  . Penicillins     REACTION: itching   Current Outpatient Medications on File Prior to Visit  Medication Sig Dispense Refill  . AMBULATORY NON FORMULARY MEDICATION Diltiazem 2% mixed with Lidocaine 5%, ratio 1:1  Apply to rectum 2-3 times a day 30 g 2  . amLODipine (NORVASC) 10 MG tablet TAKE 1 TABLET BY MOUTH ONCE DAILY 90 tablet 3  . azelastine (OPTIVAR) 0.05 % ophthalmic solution Place 1 drop into both eyes 2 (two) times daily. 6 mL 12  . cetirizine (ZYRTEC) 10 MG tablet Take 1 tablet (10 mg total) by mouth daily. (Patient taking differently: Take 10 mg by mouth as needed. ) 30 tablet 11  . cyclobenzaprine (FLEXERIL) 5 MG tablet TAKE 1 TABLET BY MOUTH THREE TIMES DAILY AS NEEDED FOR MUSCLE SPASM 30 tablet 2  . dexamethasone (DECADRON) 1 MG tablet Take at 10 PM, the night before blood test. 1 tablet 0  . diltiazem 2 % GEL Apply 1 application topically 3 (three) times daily. (Patient taking differently: Apply 1 application topically as needed. ) 30 g 3  . Lidocaine-Hydrocortisone Ace 3-2.5 % KIT Use as directed every 12 hrs as needed 1 each  0  . lubiprostone (AMITIZA) 24 MCG capsule Take 1 capsule (24 mcg total) by mouth 2 (two) times daily with a meal. (Patient taking differently: Take 24 mcg by mouth daily with breakfast. ) 60 capsule 3  . metoprolol succinate (TOPROL-XL) 50 MG 24 hr tablet TAKE 1 TABLET BY MOUTH ONCE DAILY TAKE  WITH  OR  IMMEDIATELY  FOLLOWING  A  MEAL 90 tablet 3  . potassium chloride (K-DUR) 10 MEQ tablet Take 1 tablet (10 mEq total) by mouth daily. 90 tablet 3   . traMADol (ULTRAM) 50 MG tablet Take 1 tablet (50 mg total) by mouth every 6 (six) hours as needed. 30 tablet 0  . triamcinolone (NASACORT AQ) 55 MCG/ACT AERO nasal inhaler Place 2 sprays into the nose daily. 1 Inhaler 12  . triamcinolone cream (KENALOG) 0.1 % Apply 1 application topically 2 (two) times daily. 45 g 2   No current facility-administered medications on file prior to visit.     Observations/Objective: Alert, NAD, appropriate mood and affect, resps normal, cn 2-12 intact, moves all 4s, no visible rash or swelling Lab Results  Component Value Date   WBC 7.3 07/11/2017   HGB 12.4 07/11/2017   HCT 36.3 07/11/2017   PLT 301.0 07/11/2017   GLUCOSE 101 (H) 09/21/2017   CHOL 188 07/11/2017   TRIG 205.0 (H) 07/11/2017   HDL 40.90 07/11/2017   LDLDIRECT 127.0 07/11/2017   LDLCALC 132 (H) 06/19/2015   ALT 10 07/11/2017   AST 14 07/11/2017   NA 139 09/21/2017   K 3.1 (L) 09/21/2017   CL 102 09/21/2017   CREATININE 0.84 09/21/2017   BUN 16 09/21/2017   CO2 29 09/21/2017   TSH 0.65 07/11/2017   Assessment and Plan: See notes  Follow Up Instructions: See notes   I discussed the assessment and treatment plan with the patient. The patient was provided an opportunity to ask questions and all were answered. The patient agreed with the plan and demonstrated an understanding of the instructions.   The patient was advised to call back or seek an in-person evaluation if the symptoms worsen or if the condition fails to improve as anticipated.   Cathlean Cower, MD

## 2018-05-22 NOTE — Patient Instructions (Signed)
.  Please take all new medication as prescribed - the prednisone, optivar for eyes, and nasacort nasal spray  Please continue all other medications as before, and refills have been done if requested.  Please have the pharmacy call with any other refills you may need.  Please keep your appointments with your specialists as you may have planned

## 2018-05-26 ENCOUNTER — Encounter: Payer: Self-pay | Admitting: Internal Medicine

## 2018-05-26 NOTE — Assessment & Plan Note (Signed)
Mild to mod, for optivar asd,  to f/u any worsening symptoms or concerns 

## 2018-05-26 NOTE — Assessment & Plan Note (Signed)
Mild to mod, for predpac asd, and nasacort asd,  to f/u any worsening symptoms or concerns 

## 2018-05-26 NOTE — Assessment & Plan Note (Signed)
Encouraged to continue to monitor the BP regularly with goal < 140/90

## 2018-06-12 ENCOUNTER — Encounter: Payer: Self-pay | Admitting: Internal Medicine

## 2018-06-12 MED ORDER — PREDNISONE 10 MG PO TABS: ORAL_TABLET | ORAL | 0 refills | Status: DC

## 2018-06-28 ENCOUNTER — Encounter: Payer: Self-pay | Admitting: Internal Medicine

## 2018-06-28 DIAGNOSIS — H1010 Acute atopic conjunctivitis, unspecified eye: Secondary | ICD-10-CM

## 2018-07-19 ENCOUNTER — Other Ambulatory Visit: Payer: Self-pay

## 2018-07-19 ENCOUNTER — Encounter: Payer: Self-pay | Admitting: Allergy

## 2018-07-19 ENCOUNTER — Ambulatory Visit: Payer: BC Managed Care – PPO | Admitting: Allergy

## 2018-07-19 VITALS — BP 152/102 | HR 74 | Temp 98.8°F | Resp 16 | Ht 64.0 in | Wt 149.8 lb

## 2018-07-19 DIAGNOSIS — T781XXD Other adverse food reactions, not elsewhere classified, subsequent encounter: Secondary | ICD-10-CM

## 2018-07-19 DIAGNOSIS — H1013 Acute atopic conjunctivitis, bilateral: Secondary | ICD-10-CM | POA: Diagnosis not present

## 2018-07-19 DIAGNOSIS — L2089 Other atopic dermatitis: Secondary | ICD-10-CM | POA: Diagnosis not present

## 2018-07-19 DIAGNOSIS — T7819XD Other adverse food reactions, not elsewhere classified, subsequent encounter: Secondary | ICD-10-CM

## 2018-07-19 DIAGNOSIS — J3089 Other allergic rhinitis: Secondary | ICD-10-CM | POA: Diagnosis not present

## 2018-07-19 MED ORDER — PAZEO 0.7 % OP SOLN: 1.0000 [drp] | Freq: Every day | OPHTHALMIC | 5 refills | Status: DC

## 2018-07-19 MED ORDER — PAZEO 0.7 % OP SOLN: 1.0000 [drp] | OPHTHALMIC | 5 refills | Status: DC

## 2018-07-19 MED ORDER — MOMETASONE FUROATE 0.1 % EX OINT: TOPICAL_OINTMENT | Freq: Every day | CUTANEOUS | 0 refills | Status: DC

## 2018-07-19 MED ORDER — EPINEPHRINE 0.3 MG/0.3ML IJ SOAJ: 0.3000 mg | Freq: Once | INTRAMUSCULAR | 1 refills | Status: AC

## 2018-07-19 MED ORDER — PIMECROLIMUS 1 % EX CREA: TOPICAL_CREAM | Freq: Two times a day (BID) | CUTANEOUS | 4 refills | Status: DC

## 2018-07-19 NOTE — Patient Instructions (Addendum)
Allergies  - environmental allergy skin testing today is positive to molds, dust mites, cat and dog  - allergen avoidance measures discussed/handouts provided  - for itchy/watery/red eyes Pazeo 1 drop each eye daily as needed  - for general allergy symptoms relief recommend use of Xyzal 5mg    - for nasal congestion/drainage try Rhinocort 2 sprays each nostril daily as needed  - allergen immunotherapy discussed today including protocol, benefits and risk.  Informational handout provided.  If interested in this therapuetic option you can check with your insurance carrier for coverage.  Let us know if you would like to proceed with this option.     Eczema  - Bathe and soak for 10 minutes in warm water once a day. Pat dry.  Immediately apply the below cream/ointment prescribed to red/inflammed/itchy/dry/scaly areas only. Wait 10 minutes and then apply moisturizer like Eucerin, CeraVe, Aquaphor twice a day all over.   To affected areas on the face and neck, apply: . Elidel 1% ointment twice a day as needed. . Be careful to avoid the eyes. To affected areas on the body (below the face and neck), apply: . Triamcinolone 0.1 % ointment twice a day as needed for milder flares . Mometasone 0.1% ointment once a day as needed for moderate to severe flare . With ointments be careful to avoid the armpits and groin area.   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    - tomato and shellfish were negative and will obtain serum IgE levels  - continue avoidance of all nuts,   - have access to self-injectable epinephrine (Epipen or AuviQ) 0.3mg  at all times  - follow emergency action plan in case of allergic reaction  Follow-up 4 months or sooner if needed

## 2018-07-19 NOTE — Progress Notes (Signed)
New Patient Note  RE: Kristie Nelson MRN: [id number] DOB: December 30, 1980 Date of Office Visit: 07/19/2018  Referring provider: Biagio Borg, MD Primary care provider: Biagio Borg, MD  Chief Complaint: allergies  History of present illness: Kristie Nelson is a 38 y.o. female presenting today for consultation for allergic rhinitis.  She reports having itchy eyes both her eyeball as well as the skin around her eyes.  She states the skin around her eyes were "inflammed" with a rash and get scaly.  She also reports sneezing and itchy/scratchy throat.  Nasal itch as well.  She has also noted pain around her right eye and ear at times.  No fever. Symptoms first started around April 2020.  She did see her PCP for these issues in April and states was prescribed prednisone as well as nasal spray and eye drop.  The prednisone did help but symptoms returned once prednisone completed.  She has had 2 rounds of prednisone thus far.  Optivar eyedrop has not helped.  Nasacort also has not helped.   She states she usually would have symptoms during spring but never this severe.  She states she takes zyrtec as needed but doesn't feel like this is effective either.    She reports tongue and mouth swelling after tomato ingestion that starts immediately.  It has occurred with fresh tomato as well as ketchup.  With peanuts she reports she "swells up" with just contact exposure.   Crablegs cause mouth to itch and throat to feel like it is closing.   She reports no issues with shrimp.  Blue cheese causes her to "swell up", throat itch.  States can eat other dairy but does cause to her has gas. These food reactions ave been ongoing for several years now. She states she never had any issues with foods as a child.  She does report having eczema with problem areas being her arms, back, neck and face.  She does moisturize daily with a creamy emollient.  No history of asthma.     Review of systems: Review of  Systems  Constitutional: Negative for chills, fever and malaise/fatigue.  HENT: Positive for congestion. Negative for ear discharge, ear pain, nosebleeds, sinus pain and sore throat.   Eyes: Negative for pain, discharge and redness.  Respiratory: Negative for cough, shortness of breath and wheezing.   Cardiovascular: Negative for chest pain.  Gastrointestinal: Negative for abdominal pain, constipation, diarrhea, heartburn, nausea and vomiting.  Musculoskeletal: Negative for joint pain.  Skin: Positive for itching and rash.  Neurological: Negative for headaches.    All other systems negative unless noted above in HPI  Past medical history: Past Medical History:  Diagnosis Date  . ALLERGIC RHINITIS 04/03/2007  . Anal fissure   . ANXIETY 04/03/2007  . DEPRESSION 04/03/2007  . Dysrhythmia    palpitations due to stress  . ECZEMA 03/19/2009  . Heartburn in pregnancy   . Hemorrhoids   . HYPERLIPIDEMIA 12/09/2009  . HYPERTENSION 04/03/2007  . MIGRAINE, COMMON 12/09/2009  . Postoperative anemia due to acute blood loss 08/30/2012  . Shortness of breath    with anxiety    Past surgical history: Past Surgical History:  Procedure Laterality Date  . CESAREAN SECTION  2010  . CESAREAN SECTION N/A 08/29/2012   Procedure: Repeat CESAREAN SECTION;  Surgeon: Lovenia Kim, MD;  Location: [facility];  Service: Obstetrics;  Laterality: N/A;  EDD: 09/17/12;REQUEST DEE;[name]    Family history:  Family History  Problem  Relation Age of Onset  . Diabetes Mother   . Hypertension Mother   . Heart disease Mother   . Hyperlipidemia Mother   . Diabetes Father   . Hypertension Father   . Diabetes Brother   . Hypertension Brother   . Diabetes Maternal Grandmother   . Hypertension Maternal Grandmother   . Thyroid disease Maternal Grandmother   . Hyperlipidemia Maternal Grandmother   . Liver cancer Maternal Grandfather        mets  . Hypertension Maternal Grandfather   . Other Neg Hx         hyperaldosteronism    Social history: She lives in a home with carpeting with gas heating and central cooling.  There are no pets in the home.  There is no concern for water damage, mildew or roaches in the home.  She is a Pharmacist, hospital.  She denies a smoking history.  Medication List: Allergies as of 07/19/2018      Reactions   Latex Itching   swelling   Peanut-containing Drug Products    REACTION: throat swelling   Penicillins    REACTION: itching      Medication List       Accurate as of July 19, 2018  6:43 PM. If you have any questions, ask your nurse or doctor.        STOP taking these medications   lubiprostone 24 MCG capsule Commonly known as: AMITIZA Stopped by: Dontai Pember Charmian Muff, MD   predniSONE 10 MG tablet Commonly known as: DELTASONE Stopped by: Mareena Cavan Charmian Muff, MD     TAKE these medications   AMBULATORY NON FORMULARY MEDICATION Diltiazem 2% mixed with Lidocaine 5%, ratio 1:1  Apply to rectum 2-3 times a day   amLODipine 10 MG tablet Commonly known as: NORVASC TAKE 1 TABLET BY MOUTH ONCE DAILY   azelastine 0.05 % ophthalmic solution Commonly known as: OPTIVAR Place 1 drop into both eyes 2 (two) times daily. What changed: Another medication with the same name was removed. Continue taking this medication, and follow the directions you see here. Changed by: Shakari Qazi Charmian Muff, MD   cetirizine 10 MG tablet Commonly known as: ZYRTEC Take 1 tablet (10 mg total) by mouth daily. What changed:   when to take this  reasons to take this   cyclobenzaprine 5 MG tablet Commonly known as: FLEXERIL TAKE 1 TABLET BY MOUTH THREE TIMES DAILY AS NEEDED FOR MUSCLE SPASM   dexamethasone 1 MG tablet Commonly known as: DECADRON Take at 10 PM, the night before blood test.   diltiazem 2 % Gel Apply 1 application topically 3 (three) times daily. What changed:   when to take this  reasons to take this   EPINEPHrine 0.3 mg/0.3 mL Soaj injection  Commonly known as: Auvi-Q Inject 0.3 mLs (0.3 mg total) into the muscle once for 1 dose. Started by: Jewelz Ricklefs Charmian Muff, MD   Lidocaine-Hydrocortisone Ace 3-2.5 % Kit Use as directed every 12 hrs as needed   metoprolol succinate 50 MG 24 hr tablet Commonly known as: TOPROL-XL TAKE 1 TABLET BY MOUTH ONCE DAILY TAKE  WITH  OR  IMMEDIATELY  FOLLOWING  A  MEAL   mometasone 0.1 % ointment Commonly known as: ELOCON Apply topically daily. Started by: Shadavia Dampier Charmian Muff, MD   Pazeo 0.7 % Soln Generic drug: Olopatadine HCl Place 1 drop into both eyes daily. Started by: Icela Glymph Charmian Muff, MD   pimecrolimus 1 % cream Commonly known as: ELIDEL Apply topically 2 (two)  times daily. Started by: Jacynda Brunke Charmian Muff, MD   potassium chloride 10 MEQ tablet Commonly known as: K-DUR Take 1 tablet (10 mEq total) by mouth daily.   traMADol 50 MG tablet Commonly known as: ULTRAM Take 1 tablet (50 mg total) by mouth every 6 (six) hours as needed.   triamcinolone 55 MCG/ACT Aero nasal inhaler Commonly known as: Nasacort AQ Place 2 sprays into the nose daily. What changed:   when to take this  reasons to take this  Another medication with the same name was removed. Continue taking this medication, and follow the directions you see here.   triamcinolone cream 0.1 % Commonly known as: KENALOG Apply 1 application topically 2 (two) times daily.       Known medication allergies: Allergies  Allergen Reactions  . Latex Itching    swelling  . Peanut-Containing Drug Products     REACTION: throat swelling  . Penicillins     REACTION: itching     Physical examination: Blood pressure (!) 152/102, pulse 74, temperature 98.8 F (37.1 C), resp. rate 16, height _0  (1.626 m), weight 149 lb 12.8 oz (67.9 kg), SpO2 98 %.  General: Alert, interactive, in no acute distress. HEENT: PERRLA, TMs pearly gray, turbinates mildly edematous without discharge, post-pharynx non  erythematous. Neck: Supple without lymphadenopathy. Lungs: Clear to auscultation without wheezing, rhonchi or rales. {no increased work of breathing. CV: Normal S1, S2 without murmurs. Abdomen: Nondistended, nontender. Skin: eyelids b/l with mild hyperpigmentation. Extremities:  No clubbing, cyanosis or edema. Neuro:   Grossly intact.  Diagnositics/Labs: Allergy testing: environmental allergy skin prick testing is positive to alternaria, cladosporium, dust mites.  Intradermal testing is positive to cat and dog.  Select food allergy skin testing is positive to peanut, cashew, pecan, walnut, almond, hazelnut, [place] nut, coconut, pistachio.  Negative to crab, lobster, oyster, scallops, tomato Allergy testing results were read and interpreted by provider, documented by clinical staff.  Assessment and plan:   Allergic rhinitis with conjunctivitis  - environmental allergy skin testing today is positive to molds, dust mites, cat and dog  - allergen avoidance measures discussed/handouts provided  - for itchy/watery/red eyes Pazeo 1 drop each eye daily as needed  - for general allergy symptoms relief recommend use of Xyzal 23m   - for nasal congestion/drainage try Rhinocort 2 sprays each nostril daily as needed  - allergen immunotherapy discussed today including protocol, benefits and risk.  Informational handout provided.  If interested in this therapuetic option you can check with your insurance carrier for coverage.  Let [place] if you would like to proceed with this option.     Eczema  - Bathe and soak for 10 minutes in warm water once a day. Pat dry.  Immediately apply the below cream/ointment prescribed to red/inflammed/itchy/dry/scaly areas only. Wait 10 minutes and then apply moisturizer like Eucerin, CeraVe, Aquaphor twice a day all over.   To affected areas on the face and neck, apply: . Elidel 1% ointment twice a day as needed. . Be careful to avoid the eyes. To affected areas on  the body (below the face and neck), apply: . Triamcinolone 0.1 % ointment twice a day as needed for milder flares . Mometasone 0.1% ointment once a day as needed for moderate to severe flare . With ointments be careful to avoid the armpits and groin area.   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    -  tomato and shellfish were negative and will obtain serum IgE levels  - continue avoidance of all nuts,   - have access to self-injectable epinephrine (Epipen or AuviQ) 0.58m at all times  - follow emergency action plan in case of allergic reaction  Follow-up 4 months or sooner if needed  I appreciate the opportunity to take part in Kristie Nelson's care. Please do not hesitate to contact me with questions.  Sincerely,   SPrudy Feeler MD Allergy/[facility]ology [facility]

## 2018-07-20 ENCOUNTER — Telehealth: Payer: Self-pay

## 2018-07-20 NOTE — Telephone Encounter (Signed)
Prior Auth for Elidel initiated through [email or web address] Awaiting approval Per Dr. Nelva Bush if not approve then we can do [place]

## 2018-07-20 NOTE — Telephone Encounter (Signed)
PA approved for Elidel via [email or web address]

## 2018-07-22 LAB — ALLERGEN, OYSTER, F290: F290-IgE Oyster: <0.1 kU/L

## 2018-07-22 LAB — ALLERGEN, TOMATO F25: Allergen Tomato, IgE: 31.8 kU/L — AB

## 2018-07-22 LAB — ALLERGEN LOBSTER: F080-IgE Lobster: <0.1 kU/L

## 2018-07-22 LAB — ALLERGEN, CRAB, F23: F023-IgE Crab: <0.1 kU/L

## 2018-07-22 LAB — ALLERGEN SCALLOPS F338: Scallop IgE: <0.1 kU/L

## 2018-07-25 ENCOUNTER — Ambulatory Visit (INDEPENDENT_AMBULATORY_CARE_PROVIDER_SITE_OTHER): Payer: BC Managed Care – PPO | Admitting: Internal Medicine

## 2018-07-25 ENCOUNTER — Encounter: Payer: Self-pay | Admitting: Internal Medicine

## 2018-07-25 ENCOUNTER — Other Ambulatory Visit: Payer: Self-pay

## 2018-07-25 DIAGNOSIS — I10 Essential (primary) hypertension: Secondary | ICD-10-CM | POA: Diagnosis not present

## 2018-07-25 DIAGNOSIS — H1013 Acute atopic conjunctivitis, bilateral: Secondary | ICD-10-CM

## 2018-07-25 DIAGNOSIS — J3089 Other allergic rhinitis: Secondary | ICD-10-CM

## 2018-07-25 DIAGNOSIS — J019 Acute sinusitis, unspecified: Secondary | ICD-10-CM

## 2018-07-25 MED ORDER — LEVOFLOXACIN 500 MG PO TABS: 500.0000 mg | ORAL_TABLET | Freq: Every day | ORAL | 0 refills | Status: AC

## 2018-07-25 MED ORDER — IBUPROFEN 800 MG PO TABS: 800.0000 mg | ORAL_TABLET | Freq: Three times a day (TID) | ORAL | 0 refills | Status: DC | PRN

## 2018-07-25 NOTE — Telephone Encounter (Signed)
Called pt made appt for this afternoon @ 4.Marland KitchenJohny Nelson

## 2018-07-25 NOTE — Assessment & Plan Note (Signed)
stable overall by history and exam, recent data reviewed with pt, and pt to continue medical treatment as before,  to f/u any worsening symptoms or concerns  

## 2018-07-25 NOTE — Progress Notes (Signed)
Subjective:    Patient ID: Kristie Nelson, female    DOB: 17-Apr-1980, 38 y.o.   MRN: [id number]  HPI   Here with 2-3 days acute onset fever, facial pain, pressure, headache, general weakness and malaise, and greenish d/c, with mild ST and cough, but pt denies chest pain, wheezing, increased sob or doe, orthopnea, PND, increased LE swelling, palpitations, dizziness or syncope.  Saw allergy with improved with itchy eyes and skin earlier this week.   Pt denies polydipsia, polyuria Past Medical History:  Diagnosis Date  . ALLERGIC RHINITIS 04/03/2007  . Anal fissure   . ANXIETY 04/03/2007  . DEPRESSION 04/03/2007  . Dysrhythmia    palpitations due to stress  . ECZEMA 03/19/2009  . Heartburn in pregnancy   . Hemorrhoids   . HYPERLIPIDEMIA 12/09/2009  . HYPERTENSION 04/03/2007  . MIGRAINE, COMMON 12/09/2009  . Postoperative anemia due to acute blood loss 08/30/2012  . Shortness of breath    with anxiety   Past Surgical History:  Procedure Laterality Date  . CESAREAN SECTION  2010  . CESAREAN SECTION N/A 08/29/2012   Procedure: Repeat CESAREAN SECTION;  Surgeon: Lovenia Kim, MD;  Location: [facility];  Service: Obstetrics;  Laterality: N/A;  EDD: 09/17/12;REQUEST DEE;[name]    reports that she has never smoked. She has never used smokeless tobacco. She reports that she does not drink alcohol or use drugs. family history includes Diabetes in her brother, father, maternal grandmother, and mother; Heart disease in her mother; Hyperlipidemia in her maternal grandmother and mother; Hypertension in her brother, father, maternal grandfather, maternal grandmother, and mother; Liver cancer in her maternal grandfather; Thyroid disease in her maternal grandmother. Allergies  Allergen Reactions  . Latex Itching    swelling  . Peanut-Containing Drug Products     REACTION: throat swelling  . Penicillins     REACTION: itching   Current Outpatient Medications on File Prior to Visit  Medication Sig  Dispense Refill  . AMBULATORY NON FORMULARY MEDICATION Diltiazem 2% mixed with Lidocaine 5%, ratio 1:1  Apply to rectum 2-3 times a day 30 g 2  . amLODipine (NORVASC) 10 MG tablet TAKE 1 TABLET BY MOUTH ONCE DAILY 90 tablet 3  . azelastine (OPTIVAR) 0.05 % ophthalmic solution Place 1 drop into both eyes 2 (two) times daily. 6 mL 12  . cetirizine (ZYRTEC) 10 MG tablet Take 1 tablet (10 mg total) by mouth daily. (Patient taking differently: Take 10 mg by mouth as needed. ) 30 tablet 11  . cyclobenzaprine (FLEXERIL) 5 MG tablet TAKE 1 TABLET BY MOUTH THREE TIMES DAILY AS NEEDED FOR MUSCLE SPASM 30 tablet 2  . dexamethasone (DECADRON) 1 MG tablet Take at 10 PM, the night before blood test. 1 tablet 0  . diltiazem 2 % GEL Apply 1 application topically 3 (three) times daily. (Patient taking differently: Apply 1 application topically as needed. ) 30 g 3  . Lidocaine-Hydrocortisone Ace 3-2.5 % KIT Use as directed every 12 hrs as needed 1 each 0  . metoprolol succinate (TOPROL-XL) 50 MG 24 hr tablet TAKE 1 TABLET BY MOUTH ONCE DAILY TAKE  WITH  OR  IMMEDIATELY  FOLLOWING  A  MEAL 90 tablet 3  . mometasone (ELOCON) 0.1 % ointment Apply topically daily. 45 g 0  . Olopatadine HCl (PAZEO) 0.7 % SOLN Place 1 drop into both eyes daily. 2.5 mL 5  . pimecrolimus (ELIDEL) 1 % cream Apply topically 2 (two) times daily. 60 g 4  . potassium  chloride (K-DUR) 10 MEQ tablet Take 1 tablet (10 mEq total) by mouth daily. 90 tablet 3  . traMADol (ULTRAM) 50 MG tablet Take 1 tablet (50 mg total) by mouth every 6 (six) hours as needed. 30 tablet 0  . triamcinolone (NASACORT AQ) 55 MCG/ACT AERO nasal inhaler Place 2 sprays into the nose daily. (Patient taking differently: Place 2 sprays into the nose as needed. ) 1 Inhaler 12  . triamcinolone cream (KENALOG) 0.1 % Apply 1 application topically 2 (two) times daily. 45 g 2   No current facility-administered medications on file prior to visit.    Review of Systems   Constitutional: Negative for other unusual diaphoresis or sweats HENT: Negative for ear discharge or swelling Eyes: Negative for other worsening visual disturbances Respiratory: Negative for stridor or other swelling  Gastrointestinal: Negative for worsening distension or other blood Genitourinary: Negative for retention or other urinary change Musculoskeletal: Negative for other MSK pain or swelling Skin: Negative for color change or other new lesions Neurological: Negative for worsening tremors and other numbness  Psychiatric/Behavioral: Negative for worsening agitation or other fatigue All other system neg per pt    Objective:   Physical Exam BP 114/78   Pulse 86   Temp 99 F (37.2 C) (Oral)   Ht _0  (1.626 m)   Wt 151 lb (68.5 kg)   SpO2 96%   BMI 25.92 kg/m  VS noted, mild ill Constitutional: Pt appears in NAD HENT: Head: NCAT.  Right Ear: External ear normal.  Left Ear: External ear normal.  Eyes: . Pupils are equal, round, and reactive to light. Conjunctivae and EOM are normal Bilat tm's with mild erythema.  Max sinus areas mild tender.  Pharynx with mild erythema, no exudate Nose: without d/c or deformity Neck: Neck supple. Gross normal ROM Cardiovascular: Normal rate and regular rhythm.   Pulmonary/Chest: Effort normal and breath sounds without rales or wheezing.  Neurological: Pt is alert. At baseline orientation, motor grossly intact Skin: Skin is warm. No rashes, other new lesions, no LE edema Psychiatric: Pt behavior is normal without agitation  No other exam findings Lab Results  Component Value Date   WBC 7.3 07/11/2017   HGB 12.4 07/11/2017   HCT 36.3 07/11/2017   PLT 301.0 07/11/2017   GLUCOSE 101 (H) 09/21/2017   CHOL 188 07/11/2017   TRIG 205.0 (H) 07/11/2017   HDL 40.90 07/11/2017   LDLDIRECT 127.0 07/11/2017   LDLCALC 132 (H) 06/19/2015   ALT 10 07/11/2017   AST 14 07/11/2017   NA 139 09/21/2017   K 3.1 (L) 09/21/2017   CL 102 09/21/2017    CREATININE 0.84 09/21/2017   BUN 16 09/21/2017   CO2 29 09/21/2017   TSH 0.65 07/11/2017        Assessment & Plan:

## 2018-07-25 NOTE — Telephone Encounter (Signed)
Staff to assist pt with making an visit Appt please

## 2018-07-25 NOTE — Patient Instructions (Signed)
Please take all new medication as prescribed - the antibiotic, and ibuprofen for pain  Please continue all other medications as before, and refills have been done if requested.  Please have the pharmacy call with any other refills you may need.  Please keep your appointments with your specialists as you may have planned

## 2018-07-25 NOTE — Assessment & Plan Note (Signed)
Mild to mod, for antibx course,  to f/u any worsening symptoms or concerns 

## 2018-08-01 ENCOUNTER — Other Ambulatory Visit: Payer: Self-pay | Admitting: Internal Medicine

## 2018-08-29 ENCOUNTER — Other Ambulatory Visit: Payer: Self-pay | Admitting: Internal Medicine

## 2018-09-20 ENCOUNTER — Other Ambulatory Visit: Payer: Self-pay | Admitting: Internal Medicine

## 2018-09-20 ENCOUNTER — Encounter: Payer: BC Managed Care – PPO | Admitting: Internal Medicine

## 2018-09-28 ENCOUNTER — Telehealth: Payer: Self-pay | Admitting: Family Medicine

## 2018-09-28 ENCOUNTER — Ambulatory Visit: Payer: Self-pay | Admitting: *Deleted

## 2018-09-28 ENCOUNTER — Ambulatory Visit (INDEPENDENT_AMBULATORY_CARE_PROVIDER_SITE_OTHER): Payer: BC Managed Care – PPO | Admitting: Internal Medicine

## 2018-09-28 ENCOUNTER — Other Ambulatory Visit: Payer: Self-pay

## 2018-09-28 ENCOUNTER — Other Ambulatory Visit (INDEPENDENT_AMBULATORY_CARE_PROVIDER_SITE_OTHER): Payer: BC Managed Care – PPO

## 2018-09-28 ENCOUNTER — Telehealth: Payer: Self-pay | Admitting: Internal Medicine

## 2018-09-28 ENCOUNTER — Encounter: Payer: Self-pay | Admitting: Internal Medicine

## 2018-09-28 VITALS — BP 150/80 | HR 80 | Temp 98.3°F | Ht 64.0 in | Wt 150.0 lb

## 2018-09-28 DIAGNOSIS — Z0001 Encounter for general adult medical examination with abnormal findings: Secondary | ICD-10-CM | POA: Diagnosis not present

## 2018-09-28 DIAGNOSIS — I10 Essential (primary) hypertension: Secondary | ICD-10-CM

## 2018-09-28 DIAGNOSIS — E785 Hyperlipidemia, unspecified: Secondary | ICD-10-CM

## 2018-09-28 DIAGNOSIS — M5441 Lumbago with sciatica, right side: Secondary | ICD-10-CM | POA: Diagnosis not present

## 2018-09-28 DIAGNOSIS — E876 Hypokalemia: Secondary | ICD-10-CM

## 2018-09-28 DIAGNOSIS — M25562 Pain in left knee: Secondary | ICD-10-CM

## 2018-09-28 DIAGNOSIS — H1013 Acute atopic conjunctivitis, bilateral: Secondary | ICD-10-CM

## 2018-09-28 LAB — CBC WITH DIFFERENTIAL/PLATELET
Basophils Absolute: 0.1 10*3/uL (ref 0.0–0.1)
Basophils Relative: 1.1 % (ref 0.0–3.0)
Eosinophils Absolute: 0.2 10*3/uL (ref 0.0–0.7)
Eosinophils Relative: 2.9 % (ref 0.0–5.0)
HCT: 36.7 % (ref 36.0–46.0)
Hemoglobin: 12.4 g/dL (ref 12.0–15.0)
Lymphocytes Relative: 33.1 % (ref 12.0–46.0)
Lymphs Abs: 2.1 10*3/uL (ref 0.7–4.0)
MCHC: 33.8 g/dL (ref 30.0–36.0)
MCV: 88.9 fl (ref 78.0–100.0)
Monocytes Absolute: 0.4 10*3/uL (ref 0.1–1.0)
Monocytes Relative: 6.7 % (ref 3.0–12.0)
Neutro Abs: 3.6 10*3/uL (ref 1.4–7.7)
Neutrophils Relative %: 56.2 % (ref 43.0–77.0)
Platelets: 336 10*3/uL (ref 150.0–400.0)
RBC: 4.13 Mil/uL (ref 3.87–5.11)
RDW: 13.4 % (ref 11.5–15.5)
WBC: 6.4 10*3/uL (ref 4.0–10.5)

## 2018-09-28 LAB — HEPATIC FUNCTION PANEL
ALT: 16 U/L (ref 0–35)
AST: 20 U/L (ref 0–37)
Albumin: 4.6 g/dL (ref 3.5–5.2)
Alkaline Phosphatase: 60 U/L (ref 39–117)
Bilirubin, Direct: 0 mg/dL (ref 0.0–0.3)
Total Bilirubin: 0.5 mg/dL (ref 0.2–1.2)
Total Protein: 8.1 g/dL (ref 6.0–8.3)

## 2018-09-28 LAB — URINALYSIS, ROUTINE W REFLEX MICROSCOPIC
Bilirubin Urine: NEGATIVE
Ketones, ur: NEGATIVE
Nitrite: NEGATIVE
Specific Gravity, Urine: 1.015 (ref 1.000–1.030)
Total Protein, Urine: NEGATIVE
Urine Glucose: NEGATIVE
Urobilinogen, UA: 1 (ref 0.0–1.0)
pH: 7.5 (ref 5.0–8.0)

## 2018-09-28 LAB — BASIC METABOLIC PANEL
BUN: 9 mg/dL (ref 6–23)
CO2: 30 mEq/L (ref 19–32)
Calcium: 10 mg/dL (ref 8.4–10.5)
Chloride: 98 mEq/L (ref 96–112)
Creatinine, Ser: 0.77 mg/dL (ref 0.40–1.20)
GFR: 100.19 mL/min (ref >60.00)
Glucose, Bld: 89 mg/dL (ref 70–99)
Potassium: 2.8 mEq/L — CL (ref 3.5–5.1)
Sodium: 139 mEq/L (ref 135–145)

## 2018-09-28 LAB — LIPID PANEL
Cholesterol: 232 mg/dL — ABNORMAL HIGH (ref 0–200)
HDL: 37.5 mg/dL — ABNORMAL LOW (ref >39.00)
LDL Cholesterol: 157 mg/dL — ABNORMAL HIGH (ref 0–99)
NonHDL: 194.8
Total CHOL/HDL Ratio: 6
Triglycerides: 189 mg/dL — ABNORMAL HIGH (ref 0.0–149.0)
VLDL: 37.8 mg/dL (ref 0.0–40.0)

## 2018-09-28 LAB — TSH: TSH: 1.03 u[IU]/mL (ref 0.35–4.50)

## 2018-09-28 MED ORDER — EPINASTINE HCL 0.05 % OP SOLN: 1.0000 [drp] | Freq: Two times a day (BID) | OPHTHALMIC | 12 refills | Status: DC

## 2018-09-28 MED ORDER — METOPROLOL SUCCINATE ER 100 MG PO TB24: 100.0000 mg | ORAL_TABLET | Freq: Every day | ORAL | 3 refills | Status: DC

## 2018-09-28 NOTE — Assessment & Plan Note (Signed)

## 2018-09-28 NOTE — Telephone Encounter (Signed)
Saa called from [facility] with a critical lab of 2.8  Potassium. Labs were drawn at 4:20 this afternoon. Will notify the doctor on call. Left message for on call provider to call back for lab results.  Notified provider on call for critical lab. He voiced understanding.

## 2018-09-28 NOTE — Assessment & Plan Note (Signed)
stable overall by history and exam, recent data reviewed with pt, and pt to continue medical treatment as before,  to f/u any worsening symptoms or concerns, for f/u lab today 

## 2018-09-28 NOTE — Assessment & Plan Note (Signed)
stable overall by history and exam, recent data reviewed with pt, and pt to continue medical treatment as before,  to f/u any worsening symptoms or concerns  

## 2018-09-28 NOTE — Telephone Encounter (Signed)
Spoke with the patient regarding her potassium. She notes no chest pain, palpitations, or muscle weakness. She is chronically on a potassium supplement 10 meq daily. I advised her to take 40 meq daily for the next 3 days and that I would forward this message to her PCP. Discussed if she developed any chest pain, palpitations, or muscle weakness she should seek medical attention in the ED over the weekend. She verbalized understanding.

## 2018-09-28 NOTE — Progress Notes (Signed)
Subjective:    Patient ID: Kristie Nelson, female    DOB: 02-Dec-1980, 38 y.o.   MRN: [id number]  HPI    Here for wellness and f/u;  Overall doing ok;  Pt denies Chest pain, worsening SOB, DOE, wheezing, orthopnea, PND, worsening LE edema, palpitations, dizziness or syncope.  Pt denies neurological change such as new headache, facial or extremity weakness.  Pt denies polydipsia, polyuria, or low sugar symptoms. Pt states overall good compliance with treatment and medications, good tolerability, and has been trying to follow appropriate diet.  Pt denies worsening depressive symptoms, suicidal ideation or panic. No fever, night sweats, wt loss, loss of appetite, or other constitutional symptoms.  Pt states good ability with ADL's, has low fall risk, home safety reviewed and adequate, no other significant changes in hearing or vision, and only occasionally active with exercise. BP Readings from Last 3 Encounters:  09/28/18 (!) 150/80  07/25/18 114/78  07/19/18 (!) 152/102   Wt Readings from Last 3 Encounters:  09/28/18 150 lb (68 kg)  07/25/18 151 lb (68.5 kg)  07/19/18 149 lb 12.8 oz (67.9 kg)  Also c/o left knee pain, mild intermittent with click for > 1 mo, without giveaways or falls, but with intermittent mild swelling.  Nothing else seems to make better or worse.  Does tend to misstep and fall going up stairs at times Pt continues to have recurring LBP without change in severity, bowel or bladder change, fever, wt loss,  worsening LE pain/numbness/weakness, gait change or falls. Does have several wks ongoing eye allergy symptoms with clearish congestion, itch and sneezing, without fever, pain, ST, cough, swelling or wheezing. Past Medical History:  Diagnosis Date  . ALLERGIC RHINITIS 04/03/2007  . Anal fissure   . ANXIETY 04/03/2007  . DEPRESSION 04/03/2007  . Dysrhythmia    palpitations due to stress  . ECZEMA 03/19/2009  . Heartburn in pregnancy   . Hemorrhoids   . HYPERLIPIDEMIA  12/09/2009  . HYPERTENSION 04/03/2007  . MIGRAINE, COMMON 12/09/2009  . Postoperative anemia due to acute blood loss 08/30/2012  . Shortness of breath    with anxiety   Past Surgical History:  Procedure Laterality Date  . CESAREAN SECTION  2010  . CESAREAN SECTION N/A 08/29/2012   Procedure: Repeat CESAREAN SECTION;  Surgeon: Lovenia Kim, MD;  Location: [facility];  Service: Obstetrics;  Laterality: N/A;  EDD: 09/17/12;REQUEST DEE;[name]    reports that she has never smoked. She has never used smokeless tobacco. She reports that she does not drink alcohol or use drugs. family history includes Diabetes in her brother, father, maternal grandmother, and mother; Heart disease in her mother; Hyperlipidemia in her maternal grandmother and mother; Hypertension in her brother, father, maternal grandfather, maternal grandmother, and mother; Liver cancer in her maternal grandfather; Thyroid disease in her maternal grandmother. Allergies  Allergen Reactions  . Latex Itching    swelling  . Peanut-Containing Drug Products     REACTION: throat swelling  . Penicillins     REACTION: itching   Current Outpatient Medications on File Prior to Visit  Medication Sig Dispense Refill  . AMBULATORY NON FORMULARY MEDICATION Diltiazem 2% mixed with Lidocaine 5%, ratio 1:1  Apply to rectum 2-3 times a day 30 g 2  . amLODipine (NORVASC) 10 MG tablet Take 1 tablet by mouth once daily 90 tablet 0  . azelastine (OPTIVAR) 0.05 % ophthalmic solution Place 1 drop into both eyes 2 (two) times daily. 6 mL 12  . cetirizine (  ZYRTEC) 10 MG tablet Take 1 tablet (10 mg total) by mouth daily. (Patient taking differently: Take 10 mg by mouth as needed. ) 30 tablet 11  . cyclobenzaprine (FLEXERIL) 5 MG tablet TAKE 1 TABLET BY MOUTH THREE TIMES DAILY AS NEEDED FOR MUSCLE SPASM 30 tablet 2  . ibuprofen (ADVIL) 800 MG tablet Take 1 tablet (800 mg total) by mouth every 8 (eight) hours as needed. 30 tablet 0  . mometasone (ELOCON) 0.1  % ointment Apply topically daily. 45 g 0  . Olopatadine HCl (PAZEO) 0.7 % SOLN Place 1 drop into both eyes daily. 2.5 mL 5  . pimecrolimus (ELIDEL) 1 % cream Apply topically 2 (two) times daily. 60 g 4  . potassium chloride (K-DUR) 10 MEQ tablet Take 1 tablet by mouth once daily 90 tablet 0  . triamcinolone (NASACORT AQ) 55 MCG/ACT AERO nasal inhaler Place 2 sprays into the nose daily. (Patient taking differently: Place 2 sprays into the nose as needed. ) 1 Inhaler 12  . triamcinolone cream (KENALOG) 0.1 % Apply 1 application topically 2 (two) times daily. 45 g 2  . diltiazem 2 % GEL Apply 1 application topically 3 (three) times daily. (Patient not taking: Reported on 09/28/2018) 30 g 3  . Lidocaine-Hydrocortisone Ace 3-2.5 % KIT Use as directed every 12 hrs as needed (Patient not taking: Reported on 09/28/2018) 1 each 0  . traMADol (ULTRAM) 50 MG tablet Take 1 tablet (50 mg total) by mouth every 6 (six) hours as needed. (Patient not taking: Reported on 09/28/2018) 30 tablet 0   No current facility-administered medications on file prior to visit.   ROS: Constitutional: Negative for other unusual diaphoresis, sweats, appetite or weight changes HENT: Negative for other worsening hearing loss, ear pain, facial swelling, mouth sores or neck stiffness.   Eyes: Negative for other worsening pain, redness or other visual disturbance.  Respiratory: Negative for other stridor or swelling Cardiovascular: Negative for other palpitations or other chest pain  Gastrointestinal: Negative for worsening diarrhea or loose stools, blood in stool, distention or other pain Genitourinary: Negative for hematuria, flank pain or other change in urine volume.  Musculoskeletal: Negative for myalgias or other joint swelling.  Skin: Negative for other color change, or other wound or worsening drainage.  Neurological: Negative for other syncope or numbness. Hematological: Negative for other adenopathy or swelling  Psychiatric/Behavioral: Negative for hallucinations, other worsening agitation, SI, self-injury, or new decreased concentration All otherwise neg per pt     Objective:   Physical Exam BP (!) 150/80 (BP Location: Left Arm, Patient Position: Sitting, Cuff Size: Normal)   Pulse 80   Temp 98.3 F (36.8 C) (Oral)   Ht _0  (1.626 m)   Wt 150 lb (68 kg)   SpO2 99%   BMI 25.75 kg/m  VS noted,  Constitutional: Pt is oriented to person, place, and time. Appears well-developed and well-nourished, in no significant distress and comfortable Head: Normocephalic and atraumatic  Eyes: Conjunctivae and EOM are normal. Pupils are equal, round, and reactive to light Bilat tm's with mild erythema.  Max sinus areas non tender.  Pharynx with mild erythema, no exudate Right Ear: External ear normal without discharge Left Ear: External ear normal without discharge Nose: Nose without discharge or deformity Mouth/Throat: Oropharynx is without other ulcerations and moist  Neck: Normal range of motion. Neck supple. No JVD present. No tracheal deviation present or significant neck LA or mass Cardiovascular: Normal rate, regular rhythm, normal heart sounds and intact distal pulses.  Pulmonary/Chest: WOB normal and breath sounds without rales or wheezing  Abdominal: Soft. Bowel sounds are normal. NT. No HSM  Musculoskeletal: Normal range of motion. Exhibits no edema Lymphadenopathy: Has no other cervical adenopathy.  Neurological: Pt is alert and oriented to person, place, and time. Pt has normal reflexes. No cranial nerve deficit. Motor grossly intact, Gait intact Skin: Skin is warm and dry. No rash noted or new ulcerations Psychiatric:  Has normal mood and affect. Behavior is normal without agitation All otherwise neg per pt  Lab Results  Component Value Date   WBC 7.3 07/11/2017   HGB 12.4 07/11/2017   HCT 36.3 07/11/2017   PLT 301.0 07/11/2017   GLUCOSE 101 (H) 09/21/2017   CHOL 188 07/11/2017    TRIG 205.0 (H) 07/11/2017   HDL 40.90 07/11/2017   LDLDIRECT 127.0 07/11/2017   LDLCALC 132 (H) 06/19/2015   ALT 10 07/11/2017   AST 14 07/11/2017   NA 139 09/21/2017   K 3.1 (L) 09/21/2017   CL 102 09/21/2017   CREATININE 0.84 09/21/2017   BUN 16 09/21/2017   CO2 29 09/21/2017   TSH 0.65 07/11/2017       Assessment & Plan:

## 2018-09-28 NOTE — Assessment & Plan Note (Signed)
Uncontrolled, ok to increase the toprol xl 100 qd, f/u BP at home and next visit, o/w stable overall by history and exam, recent data reviewed with pt, and pt to continue medical treatment as before,  to f/u any worsening symptoms or concerns

## 2018-09-28 NOTE — Patient Instructions (Addendum)
Please take all new medication as prescribed - the eye drops for allergies  OK to increase the toprol XL to 100 mg per day  You will be contacted regarding the referral for: Dr Tamala Julian of Sports Medicine for the left knee  Please continue all other medications as before, and refills have been done if requested.  Please have the pharmacy call with any other refills you may need.  Please continue your efforts at being more active, low cholesterol diet, and weight control.  You are otherwise up to date with prevention measures today.  Please keep your appointments with your specialists as you may have planned  Please go to the LAB in the Basement (turn left off the elevator) for the tests to be done today  You will be contacted by phone if any changes need to be made immediately.  Otherwise, you will receive a letter about your results with an explanation, but please check with MyChart first.  Please remember to sign up for MyChart if you have not done so, as this will be important to you in the future with finding out test results, communicating by private email, and scheduling acute appointments online when needed.  Please return in 6 months, or sooner if needed

## 2018-09-28 NOTE — Telephone Encounter (Signed)
Received call from [facility] regarding a critical lab. Potassium is 2.8. Attempted to call patient x2 and left messages asking her to call back to the office number regarding lab results. Attempted to call listed emergency contact number though there was no answer. Will attempt to contact the patient again later.

## 2018-09-28 NOTE — Assessment & Plan Note (Signed)
?   Meniscal tear  - for sport med referral

## 2018-09-28 NOTE — Assessment & Plan Note (Addendum)
.  Mild to mod, for epinastine bid prn,  to f/u any worsening symptoms or concerns  In addition to the time spent performing CPE, I spent an additional 25 minutes face to face,in which greater than 50% of this time was spent in counseling and coordination of care for patient's acute illness as documented, including the differential dx, treatment, further evaluation and other management of allergic conjunctivitis, HLD, HTN, low back pain, left knee pain

## 2018-10-02 NOTE — Telephone Encounter (Signed)
Pt calling back today, stating she took the extra potassium as she was advised by Dr Josephina Gip.  But now pt states she will be running out of her potassium early and need a new rx for  potassium chloride (K-DUR) 10 MEQ tablet  Also pt would like to know if she should do anything differently.  Pt states she cannot tell a difference in how she feels.  She was having some muscle weakness. but that had been going on a while.  But is feeling ok today.  Pt did not go to the ED as sh was advised,  [place], [place] [phone] (Phone) [phone] (Fax)

## 2018-10-03 MED ORDER — POTASSIUM CHLORIDE ER 10 MEQ PO TBCR: 20.0000 meq | EXTENDED_RELEASE_TABLET | Freq: Every day | ORAL | 3 refills | Status: DC

## 2018-10-03 NOTE — Addendum Note (Signed)
Addended by: Biagio Borg on: 10/03/2018 01:11 PM   Modules accepted: Orders

## 2018-10-03 NOTE — Telephone Encounter (Signed)
[place] for 2 potassium pills per day  Then please return to lab in 1 week for recheck  I still have concerns about why this is happening as there is no obvious reason, so I will need to look at this further  [place] to please inform pt

## 2018-10-03 NOTE — Telephone Encounter (Signed)
Pt has been informed.

## 2018-10-10 ENCOUNTER — Other Ambulatory Visit (INDEPENDENT_AMBULATORY_CARE_PROVIDER_SITE_OTHER): Payer: BC Managed Care – PPO

## 2018-10-10 DIAGNOSIS — E876 Hypokalemia: Secondary | ICD-10-CM

## 2018-10-10 LAB — BASIC METABOLIC PANEL
BUN: 11 mg/dL (ref 6–23)
CO2: 30 mEq/L (ref 19–32)
Calcium: 9.7 mg/dL (ref 8.4–10.5)
Chloride: 101 mEq/L (ref 96–112)
Creatinine, Ser: 0.72 mg/dL (ref 0.40–1.20)
GFR: 108.24 mL/min (ref >60.00)
Glucose, Bld: 83 mg/dL (ref 70–99)
Potassium: 3.1 mEq/L — ABNORMAL LOW (ref 3.5–5.1)
Sodium: 140 mEq/L (ref 135–145)

## 2018-10-15 ENCOUNTER — Other Ambulatory Visit: Payer: Self-pay | Admitting: Internal Medicine

## 2018-10-15 ENCOUNTER — Telehealth: Payer: Self-pay

## 2018-10-15 DIAGNOSIS — E876 Hypokalemia: Secondary | ICD-10-CM

## 2018-10-15 NOTE — Telephone Encounter (Signed)
Patient returned call and scheduled new patient appointment.

## 2018-10-15 NOTE — Telephone Encounter (Signed)
Called to schedule new patient appointment. Left message to call back.  

## 2018-11-15 ENCOUNTER — Other Ambulatory Visit: Payer: Self-pay

## 2018-11-15 ENCOUNTER — Ambulatory Visit (INDEPENDENT_AMBULATORY_CARE_PROVIDER_SITE_OTHER): Payer: BC Managed Care – PPO | Admitting: Family Medicine

## 2018-11-15 ENCOUNTER — Encounter: Payer: Self-pay | Admitting: Family Medicine

## 2018-11-15 ENCOUNTER — Ambulatory Visit (INDEPENDENT_AMBULATORY_CARE_PROVIDER_SITE_OTHER)
Admission: RE | Admit: 2018-11-15 | Discharge: 2018-11-15 | Disposition: A | Discharge status: Home / Self Care | Payer: BC Managed Care – PPO | Source: Ambulatory Visit | Attending: Family Medicine | Admitting: Family Medicine

## 2018-11-15 VITALS — BP 110/70 | HR 77 | Ht 64.0 in | Wt 156.0 lb

## 2018-11-15 DIAGNOSIS — M25561 Pain in right knee: Secondary | ICD-10-CM | POA: Diagnosis not present

## 2018-11-15 DIAGNOSIS — G8929 Other chronic pain: Secondary | ICD-10-CM

## 2018-11-15 DIAGNOSIS — M222X1 Patellofemoral disorders, right knee: Secondary | ICD-10-CM | POA: Diagnosis not present

## 2018-11-15 DIAGNOSIS — M222X2 Patellofemoral disorders, left knee: Secondary | ICD-10-CM | POA: Diagnosis not present

## 2018-11-15 DIAGNOSIS — M25562 Pain in left knee: Secondary | ICD-10-CM

## 2018-11-15 NOTE — Patient Instructions (Signed)
Good to see you.  Ice 20 minutes 2 times daily. Usually after activity and before bed. Exercises 3 times a week.  pennsaid pinkie amount topically 2 times daily as needed.  Turmeric 500mg  daily  Vitamin D 2000 IU daily  See me again in 5-6 weeks

## 2018-11-15 NOTE — Progress Notes (Signed)
[facility] [place],  [id number] Phone: [phone] Subjective:   I Kandace Blitz am serving as a Education administrator for Dr. Hulan Saas.  I'm seeing this patient by the request  of:  Biagio Borg, MD   CC: Bilateral knee pain  [id number]  TAMEAKA EICHHORN is a 38 y.o. female coming in with complaint of bilateral knee pain. Patient states some days are better than others. Experiences stiffness. Pain radiates down the leg.  Onset- 1 year Location -  Duration-  Character- achy  Aggravating factors- standing or sitting for long periods of time  Reliving factors-  Therapies tried- Ibuprofen  Severity-  6/10 at its worse      Past Medical History:  Diagnosis Date  . ALLERGIC RHINITIS 04/03/2007  . Anal fissure   . ANXIETY 04/03/2007  . DEPRESSION 04/03/2007  . Dysrhythmia    palpitations due to stress  . ECZEMA 03/19/2009  . Heartburn in pregnancy   . Hemorrhoids   . HYPERLIPIDEMIA 12/09/2009  . HYPERTENSION 04/03/2007  . MIGRAINE, COMMON 12/09/2009  . Postoperative anemia due to acute blood loss 08/30/2012  . Shortness of breath    with anxiety   Past Surgical History:  Procedure Laterality Date  . CESAREAN SECTION  2010  . CESAREAN SECTION N/A 08/29/2012   Procedure: Repeat CESAREAN SECTION;  Surgeon: Lovenia Kim, MD;  Location: [facility];  Service: Obstetrics;  Laterality: N/A;  EDD: 09/17/12;REQUEST DEE;[name]   Social History   Socioeconomic History  . Marital status: Married    Spouse name: Not on file  . Number of children: 2  . Years of education: Not on file  . Highest education level: Not on file  Occupational History  . Occupation: TEACHER    Employer: [employer]  Social Needs  . Financial resource strain: Not on file  . Food insecurity    Worry: Not on file    Inability: Not on file  . Transportation needs    Medical: Not on file    Non-medical: Not on file  Tobacco Use  . Smoking status: Never Smoker   . Smokeless tobacco: Never Used  Substance and Sexual Activity  . Alcohol use: No    Alcohol/week: 0.0 standard drinks  . Drug use: No  . Sexual activity: Not on file  Lifestyle  . Physical activity    Days per week: Not on file    Minutes per session: Not on file  . Stress: Not on file  Relationships  . Social Herbalist on phone: Not on file    Gets together: Not on file    Attends religious service: Not on file    Active member of club or organization: Not on file    Attends meetings of clubs or organizations: Not on file    Relationship status: Not on file  Other Topics Concern  . Not on file  Social History Narrative   Kindergarten teacher - [employer]   Married, 1 son 2010 and 1 daughter 2014   02/25/2016   Allergies  Allergen Reactions  . Latex Itching    swelling  . Peanut-Containing Drug Products     REACTION: throat swelling  . Penicillins     REACTION: itching   Family History  Problem Relation Age of Onset  . Diabetes Mother   . Hypertension Mother   . Heart disease Mother   . Hyperlipidemia Mother   . Diabetes Father   .  Hypertension Father   . Diabetes Brother   . Hypertension Brother   . Diabetes Maternal Grandmother   . Hypertension Maternal Grandmother   . Thyroid disease Maternal Grandmother   . Hyperlipidemia Maternal Grandmother   . Liver cancer Maternal Grandfather        mets  . Hypertension Maternal Grandfather   . Other Neg Hx        hyperaldosteronism     Current Outpatient Medications (Cardiovascular):  .  amLODipine (NORVASC) 10 MG tablet, Take 1 tablet by mouth once daily .  diltiazem 2 % GEL*, Apply 1 application topically 3 (three) times daily. .  metoprolol succinate (TOPROL-XL) 100 MG 24 hr tablet, Take 1 tablet (100 mg total) by mouth daily. Take with or immediately following a meal.  Current Outpatient Medications (Respiratory):  .  cetirizine (ZYRTEC) 10 MG tablet, Take 1 tablet (10 mg total) by mouth  daily. (Patient taking differently: Take 10 mg by mouth as needed. ) .  triamcinolone (NASACORT AQ) 55 MCG/ACT AERO nasal inhaler, Place 2 sprays into the nose daily. (Patient taking differently: Place 2 sprays into the nose as needed. )  Current Outpatient Medications (Analgesics):  .  ibuprofen (ADVIL) 800 MG tablet, Take 1 tablet (800 mg total) by mouth every 8 (eight) hours as needed. .  traMADol (ULTRAM) 50 MG tablet, Take 1 tablet (50 mg total) by mouth every 6 (six) hours as needed.   Current Outpatient Medications (Other):  Marland Kitchen  AMBULATORY NON FORMULARY MEDICATION, Diltiazem 2% mixed with Lidocaine 5%, ratio 1:1  Apply to rectum 2-3 times a day .  azelastine (OPTIVAR) 0.05 % ophthalmic solution, Place 1 drop into both eyes 2 (two) times daily. .  cyclobenzaprine (FLEXERIL) 5 MG tablet, TAKE 1 TABLET BY MOUTH THREE TIMES DAILY AS NEEDED FOR MUSCLE SPASM .  diltiazem 2 % GEL*, Apply 1 application topically 3 (three) times daily. Marland Kitchen  Epinastine HCl 0.05 % ophthalmic solution, Place 1 drop into both eyes 2 (two) times daily. .  Lidocaine-Hydrocortisone Ace 3-2.5 % KIT, Use as directed every 12 hrs as needed .  mometasone (ELOCON) 0.1 % ointment, Apply topically daily. .  Olopatadine HCl (PAZEO) 0.7 % SOLN, Place 1 drop into both eyes daily. .  pimecrolimus (ELIDEL) 1 % cream, Apply topically 2 (two) times daily. .  potassium chloride (KLOR-CON) 10 MEQ tablet, Take 2 tablets (20 mEq total) by mouth daily. Marland Kitchen  triamcinolone cream (KENALOG) 0.1 %, Apply 1 application topically 2 (two) times daily. * These medications belong to multiple therapeutic classes and are listed under each applicable group.    Past medical history, social, surgical and family history all reviewed in electronic medical record.  No pertanent information unless stated regarding to the chief complaint.   Review of Systems:  No headache, visual changes, nausea, vomiting, diarrhea, constipation, dizziness, abdominal pain,  skin rash, fevers, chills, night sweats, weight loss, swollen lymph nodes, body aches, joint swelling,  chest pain, shortness of breath, mood changes.  Positive muscle aches  Objective  Blood pressure 110/70, pulse 77, height _0  (1.626 m), weight 156 lb (70.8 kg), SpO2 99 %.    General: No apparent distress alert and oriented x3 mood and affect normal, dressed appropriately.  HEENT: Pupils equal, extraocular movements intact  Respiratory: Patient's speak in full sentences and does not appear short of breath  Cardiovascular: No lower extremity edema, non tender, no erythema  Skin: Warm dry intact with no signs of infection or rash on extremities  or on axial skeleton.  Abdomen: Soft nontender  Neuro: Cranial nerves II through XII are intact, neurovascularly intact in all extremities with 2+ DTRs and 2+ pulses.  Lymph: No lymphadenopathy of posterior or anterior cervical chain or axillae bilaterally.  Gait normal with good balance and coordination.  MSK:  tender with full range of motion and good stability and symmetric strength and tone of shoulders, elbows, wrist, hip and ankles bilaterally.  Bilateral knee exam shows the patient does have positive patellar crepitus.  Patient does have lateral tracking of the left knee.  Patient does have mild tenderness to palpation in the patellofemoral joint.  Good stability of the left knee noted.  [id number]; 15 additional minutes spent for Therapeutic exercises as stated in above notes.  This included exercises focusing on stretching, strengthening, with significant focus on eccentric aspects.   Long term goals include an improvement in range of motion, strength, endurance as well as avoiding reinjury. Patient's frequency would include in 1-2 times a day, 3-5 times a week for a duration of 6-12 weeks. Reviewed anatomy using anatomical model and how PFS occurs.  Given rehab exercises handout for VMO, hip abductors, core, entire kinetic chain including  proprioception exercises.  Could benefit from PT, regular exercise, upright biking, and a PFS knee brace to assist with tracking abnormalities.   Proper technique shown and discussed handout in great detail with ATC.  All questions were discussed and answered.     Impression and Recommendations:     This case required medical decision making of moderate complexity. The above documentation has been reviewed and is accurate and complete Lyndal Pulley, DO       Note: This dictation was prepared with Dragon dictation along with smaller phrase technology. Any transcriptional errors that result from this process are unintentional.

## 2018-11-15 NOTE — Assessment & Plan Note (Signed)
Appears to be more patellofemoral.  Discussed icing regimen and home exercise, topical anti-inflammatories, which activities to do which wants to avoid.  Patient is to increase activity slowly.  Worsening pain will consider formal physical therapy or possible injections.  Patient is in agreement with the plan x-rays pending.

## 2018-11-21 ENCOUNTER — Encounter: Payer: Self-pay | Admitting: Family Medicine

## 2018-11-22 ENCOUNTER — Ambulatory Visit: Payer: BC Managed Care – PPO | Admitting: Allergy

## 2018-11-25 ENCOUNTER — Other Ambulatory Visit: Payer: Self-pay | Admitting: Internal Medicine

## 2018-12-12 ENCOUNTER — Encounter: Payer: Self-pay | Admitting: Internal Medicine

## 2018-12-12 ENCOUNTER — Telehealth: Payer: Self-pay | Admitting: Internal Medicine

## 2018-12-12 ENCOUNTER — Ambulatory Visit (INDEPENDENT_AMBULATORY_CARE_PROVIDER_SITE_OTHER): Payer: BC Managed Care – PPO | Admitting: Internal Medicine

## 2018-12-12 DIAGNOSIS — F32A Depression, unspecified: Secondary | ICD-10-CM

## 2018-12-12 DIAGNOSIS — F411 Generalized anxiety disorder: Secondary | ICD-10-CM | POA: Diagnosis not present

## 2018-12-12 DIAGNOSIS — F329 Major depressive disorder, single episode, unspecified: Secondary | ICD-10-CM | POA: Diagnosis not present

## 2018-12-12 DIAGNOSIS — R1012 Left upper quadrant pain: Secondary | ICD-10-CM | POA: Diagnosis not present

## 2018-12-12 DIAGNOSIS — R1011 Right upper quadrant pain: Secondary | ICD-10-CM | POA: Diagnosis not present

## 2018-12-12 NOTE — Assessment & Plan Note (Signed)
I suspect some mild msk type complaint and not really a "pain " per pt; should be ok to follow for now with expectant management, and consider imaging and/or lab evaluatoin for any worsening; will hold on any specific tx for now except tylenol prn

## 2018-12-12 NOTE — Assessment & Plan Note (Signed)
stable overall by history and exam, recent data reviewed with pt, and pt to continue medical treatment as before,  to f/u any worsening symptoms or concerns  

## 2018-12-12 NOTE — Patient Instructions (Signed)
Ok for tylenol as needed  Please return for any worsening symptoms  Please continue all other medications as before, and refills have been done if requested.  Please have the pharmacy call with any other refills you may need.  Please keep your appointments with your specialists as you may have planned

## 2018-12-12 NOTE — Telephone Encounter (Signed)
Copied from [place] [id number]. Topic: General - Other >> Dec 12, 2018  8:51 AM Keene Breath wrote: Reason for CRM: Patient would like a call from the nurse on advice regarding her stomach issues.  She stated that she feels trembling and a little pain in her stomach.  No diarrhea, sharp pain or nausea.  Patient is not sure if she needs an appt.  Please advise and call patient back at [phone]

## 2018-12-12 NOTE — Telephone Encounter (Signed)
Noted  

## 2018-12-12 NOTE — Telephone Encounter (Signed)
FYI, Patient scheduled for a DOXY at 1:00pm today.

## 2018-12-12 NOTE — Progress Notes (Signed)
Patient ID: Kristie Nelson, female   DOB: 09-03-1980, 38 y.o.   MRN: [id number]  Virtual Visit via Video Note  I connected with Kristie Nelson on 12/12/18 at  1:00 PM EST by a video enabled telemedicine application and verified that I am speaking with the correct person using two identifiers.  Location: Patient: at home Provider: at office   I discussed the limitations of evaluation and management by telemedicine and the availability of in person appointments. The patient expressed understanding and agreed to proceed.  History of Present Illness: Here with 2 days onset unusual mild intermittent "trembling" type sensation to the mid and upper abdomen she has not had previously.  Pt denies chest pain, increased sob or doe, wheezing, orthopnea, PND, increased LE swelling, palpitations, dizziness or syncope.   Pt denies fever, wt loss, night sweats, loss of appetite, or other constitutional symptoms  Denies worsening reflux, abd pain, dysphagia, n/v, bowel change or blood except for some mild nausea this am for a short time.  No recent COVID exposure known.  No back pain. Denies urinary symptoms such as dysuria, frequency, urgency, flank pain, hematuria or n/v, fever, chills.  Denies worsening depressive symptoms, suicidal ideation, or panic. Past Medical History:  Diagnosis Date  . ALLERGIC RHINITIS 04/03/2007  . Anal fissure   . ANXIETY 04/03/2007  . DEPRESSION 04/03/2007  . Dysrhythmia    palpitations due to stress  . ECZEMA 03/19/2009  . Heartburn in pregnancy   . Hemorrhoids   . HYPERLIPIDEMIA 12/09/2009  . HYPERTENSION 04/03/2007  . MIGRAINE, COMMON 12/09/2009  . Postoperative anemia due to acute blood loss 08/30/2012  . Shortness of breath    with anxiety   Past Surgical History:  Procedure Laterality Date  . CESAREAN SECTION  2010  . CESAREAN SECTION N/A 08/29/2012   Procedure: Repeat CESAREAN SECTION;  Surgeon: Lovenia Kim, MD;  Location: [facility];  Service: Obstetrics;  Laterality:  N/A;  EDD: 09/17/12;REQUEST DEE;[name]    reports that she has never smoked. She has never used smokeless tobacco. She reports that she does not drink alcohol or use drugs. family history includes Diabetes in her brother, father, maternal grandmother, and mother; Heart disease in her mother; Hyperlipidemia in her maternal grandmother and mother; Hypertension in her brother, father, maternal grandfather, maternal grandmother, and mother; Liver cancer in her maternal grandfather; Thyroid disease in her maternal grandmother. Allergies  Allergen Reactions  . Latex Itching    swelling  . Peanut-Containing Drug Products     REACTION: throat swelling  . Penicillins     REACTION: itching   Current Outpatient Medications on File Prior to Visit  Medication Sig Dispense Refill  . AMBULATORY NON FORMULARY MEDICATION Diltiazem 2% mixed with Lidocaine 5%, ratio 1:1  Apply to rectum 2-3 times a day 30 g 2  . amLODipine (NORVASC) 10 MG tablet Take 1 tablet by mouth once daily 90 tablet 0  . azelastine (OPTIVAR) 0.05 % ophthalmic solution Place 1 drop into both eyes 2 (two) times daily. 6 mL 12  . cetirizine (ZYRTEC) 10 MG tablet Take 1 tablet (10 mg total) by mouth daily. (Patient taking differently: Take 10 mg by mouth as needed. ) 30 tablet 11  . cyclobenzaprine (FLEXERIL) 5 MG tablet TAKE 1 TABLET BY MOUTH THREE TIMES DAILY AS NEEDED FOR MUSCLE SPASM 30 tablet 2  . diltiazem 2 % GEL Apply 1 application topically 3 (three) times daily. 30 g 3  . Epinastine HCl 0.05 % ophthalmic solution  Place 1 drop into both eyes 2 (two) times daily. 10 mL 12  . ibuprofen (ADVIL) 800 MG tablet Take 1 tablet (800 mg total) by mouth every 8 (eight) hours as needed. 30 tablet 0  . Lidocaine-Hydrocortisone Ace 3-2.5 % KIT Use as directed every 12 hrs as needed 1 each 0  . metoprolol succinate (TOPROL-XL) 100 MG 24 hr tablet Take 1 tablet (100 mg total) by mouth daily. Take with or immediately following a meal. 90 tablet 3   . mometasone (ELOCON) 0.1 % ointment Apply topically daily. 45 g 0  . Olopatadine HCl (PAZEO) 0.7 % SOLN Place 1 drop into both eyes daily. 2.5 mL 5  . pimecrolimus (ELIDEL) 1 % cream Apply topically 2 (two) times daily. 60 g 4  . potassium chloride (KLOR-CON) 10 MEQ tablet Take 2 tablets (20 mEq total) by mouth daily. 180 tablet 3  . traMADol (ULTRAM) 50 MG tablet Take 1 tablet (50 mg total) by mouth every 6 (six) hours as needed. 30 tablet 0  . triamcinolone (NASACORT AQ) 55 MCG/ACT AERO nasal inhaler Place 2 sprays into the nose daily. (Patient taking differently: Place 2 sprays into the nose as needed. ) 1 Inhaler 12  . triamcinolone cream (KENALOG) 0.1 % Apply 1 application topically 2 (two) times daily. 45 g 2   No current facility-administered medications on file prior to visit.      Observations/Objective: Alert, NAD, appropriate mood and affect, resps normal, cn 2-12 intact, moves all 4s, no visible rash or swelling Lab Results  Component Value Date   WBC 6.4 09/28/2018   HGB 12.4 09/28/2018   HCT 36.7 09/28/2018   PLT 336.0 09/28/2018   GLUCOSE 83 10/10/2018   CHOL 232 (H) 09/28/2018   TRIG 189.0 (H) 09/28/2018   HDL 37.50 (L) 09/28/2018   LDLDIRECT 127.0 07/11/2017   LDLCALC 157 (H) 09/28/2018   ALT 16 09/28/2018   AST 20 09/28/2018   NA 140 10/10/2018   K 3.1 (L) 10/10/2018   CL 101 10/10/2018   CREATININE 0.72 10/10/2018   BUN 11 10/10/2018   CO2 30 10/10/2018   TSH 1.03 09/28/2018   Assessment and Plan: See notes  Follow Up Instructions: See notes   I discussed the assessment and treatment plan with the patient. The patient was provided an opportunity to ask questions and all were answered. The patient agreed with the plan and demonstrated an understanding of the instructions.   The patient was advised to call back or seek an in-person evaluation if the symptoms worsen or if the condition fails to improve as anticipated.   Cathlean Cower, MD

## 2018-12-17 ENCOUNTER — Encounter: Payer: Self-pay | Admitting: *Deleted

## 2018-12-19 ENCOUNTER — Ambulatory Visit: Payer: BC Managed Care – PPO | Admitting: Family Medicine

## 2019-01-07 ENCOUNTER — Ambulatory Visit: Payer: Self-pay | Admitting: Internal Medicine

## 2019-01-07 NOTE — Telephone Encounter (Signed)
Pt reports BP this am 166/93, last night 186/103. States her medications were adjusted twice due to hypokalemia, by Dr. Marisue Humble at  [facility].  States she has attempted to reach them but has not heard back.  Also reports she called their on call MD last night, "He said it was nothing to worry about, no big deal. "   Reports slight headache, no other symptoms.  Pt would like Dr.John's advise.   Care advise given per protocol.  Please advise:  [phone]  Reason for Disposition . Systolic BP  >= 160 OR Diastolic >= 100  Answer Assessment - Initial Assessment Questions 1. BLOOD PRESSURE: "What is the blood pressure?" "Did you take at least two measurements 5 minutes apart?"     *No Answer* 2. ONSET: "When did you take your blood pressure?"     *No Answer* 3. HOW: "How did you obtain the blood pressure?" (e.g., visiting nurse, automatic home BP monitor)     *No Answer* 4. HISTORY: "Do you have a history of high blood pressure?"     *No Answer* 5. MEDICATIONS: "Are you taking any medications for blood pressure?" "Have you missed any doses recently?"     *No Answer* 6. OTHER SYMPTOMS: "Do you have any symptoms?" (e.g., headache, chest pain, blurred vision, difficulty breathing, weakness)     Slight  headache  Protocols used: HIGH BLOOD PRESSURE-A-AH

## 2019-01-08 NOTE — Telephone Encounter (Signed)
Ok for OV - in person or virtual

## 2019-01-08 NOTE — Telephone Encounter (Signed)
Sent to front office-[place] to make in person or virtual to make appointment.

## 2019-01-09 ENCOUNTER — Ambulatory Visit (INDEPENDENT_AMBULATORY_CARE_PROVIDER_SITE_OTHER): Payer: BC Managed Care – PPO | Admitting: Internal Medicine

## 2019-01-09 ENCOUNTER — Encounter: Payer: Self-pay | Admitting: Internal Medicine

## 2019-01-09 DIAGNOSIS — I10 Essential (primary) hypertension: Secondary | ICD-10-CM

## 2019-01-09 DIAGNOSIS — F329 Major depressive disorder, single episode, unspecified: Secondary | ICD-10-CM

## 2019-01-09 DIAGNOSIS — F411 Generalized anxiety disorder: Secondary | ICD-10-CM

## 2019-01-09 DIAGNOSIS — F32A Depression, unspecified: Secondary | ICD-10-CM

## 2019-01-09 MED ORDER — HYDRALAZINE HCL 50 MG PO TABS: 50.0000 mg | ORAL_TABLET | Freq: Three times a day (TID) | ORAL | 5 refills | Status: DC

## 2019-01-09 NOTE — Assessment & Plan Note (Signed)
Has seen renal with amlod and K stopped, now on toprl xl 100 and aldactone 50 but pressure not well controlled.  Unclear to me why amlodipine stopped, as did not have LE swelling. OK for now for hydralazine 50 tid, and asked pt to call renal to f/u with nurse visit for BP there next wk as was originally suggested by renal (per pt)

## 2019-01-09 NOTE — Progress Notes (Signed)
Patient ID: Kristie Nelson, female   DOB: 08-19-1980, 39 y.o.   MRN: [id number]  Virtual Visit via Video Note  I connected with [name] on 01/09/19 at  9:00 AM EST by a video enabled telemedicine application and verified that I am speaking with the correct person using two identifiers.  Location:  Patient: at home Provider: at office   I discussed the limitations of evaluation and management by telemedicine and the availability of in person appointments. The patient expressed understanding and agreed to proceed.  History of Present Illness: Here to f/u with persistent elevated BP; has seen renal with stoppage of amlodipine 10 mg and potassium with ok f/u labs dec 30 by report; instead now taking aldactone 50 qd and toprol xl 100 qd, with plan to f/u next wk.  Unfortunately BP have been quite high average about 186/103 since then, 169/100 this am.  Unable to get a call back from renal so far.  Pt denies chest pain, increased sob or doe, wheezing, orthopnea, PND, increased LE swelling, palpitations, dizziness or syncope.  Pt denies new neurological symptoms such as new headache, or facial or extremity weakness or numbness   Pt denies polydipsia, polyuria.  Denies worsening depressive symptoms, suicidal ideation, or panic; has ongoing anxiety Past Medical History:  Diagnosis Date  . ALLERGIC RHINITIS 04/03/2007  . Anal fissure   . ANXIETY 04/03/2007  . DEPRESSION 04/03/2007  . Dysrhythmia    palpitations due to stress  . ECZEMA 03/19/2009  . Heartburn in pregnancy   . Hemorrhoids   . HYPERLIPIDEMIA 12/09/2009  . HYPERTENSION 04/03/2007  . MIGRAINE, COMMON 12/09/2009  . Postoperative anemia due to acute blood loss 08/30/2012  . Shortness of breath    with anxiety   Past Surgical History:  Procedure Laterality Date  . CESAREAN SECTION  2010  . CESAREAN SECTION N/A 08/29/2012   Procedure: Repeat CESAREAN SECTION;  Surgeon: Lovenia Kim, MD;  Location: [facility];  Service: Obstetrics;   Laterality: N/A;  EDD: 09/17/12;REQUEST DEE;[name]    reports that she has never smoked. She has never used smokeless tobacco. She reports that she does not drink alcohol or use drugs. family history includes Diabetes in her brother, father, maternal grandmother, and mother; Heart disease in her mother; Hyperlipidemia in her maternal grandmother and mother; Hypertension in her brother, father, maternal grandfather, maternal grandmother, and mother; Liver cancer in her maternal grandfather; Thyroid disease in her maternal grandmother. Allergies  Allergen Reactions  . Latex Itching    swelling  . Peanut-Containing Drug Products     REACTION: throat swelling  . Penicillins     REACTION: itching   Current Outpatient Medications on File Prior to Visit  Medication Sig Dispense Refill  . AMBULATORY NON FORMULARY MEDICATION Diltiazem 2% mixed with Lidocaine 5%, ratio 1:1  Apply to rectum 2-3 times a day 30 g 2  . azelastine (OPTIVAR) 0.05 % ophthalmic solution Place 1 drop into both eyes 2 (two) times daily. 6 mL 12  . cetirizine (ZYRTEC) 10 MG tablet Take 1 tablet (10 mg total) by mouth daily. (Patient taking differently: Take 10 mg by mouth as needed. ) 30 tablet 11  . cyclobenzaprine (FLEXERIL) 5 MG tablet TAKE 1 TABLET BY MOUTH THREE TIMES DAILY AS NEEDED FOR MUSCLE SPASM 30 tablet 2  . Epinastine HCl 0.05 % ophthalmic solution Place 1 drop into both eyes 2 (two) times daily. 10 mL 12  . ibuprofen (ADVIL) 800 MG tablet Take 1 tablet (800  mg total) by mouth every 8 (eight) hours as needed. 30 tablet 0  . Lidocaine-Hydrocortisone Ace 3-2.5 % KIT Use as directed every 12 hrs as needed 1 each 0  . metoprolol succinate (TOPROL-XL) 100 MG 24 hr tablet Take 1 tablet (100 mg total) by mouth daily. Take with or immediately following a meal. 90 tablet 3  . mometasone (ELOCON) 0.1 % ointment Apply topically daily. 45 g 0  . Olopatadine HCl (PAZEO) 0.7 % SOLN Place 1 drop into both eyes daily. 2.5 mL 5  .  pimecrolimus (ELIDEL) 1 % cream Apply topically 2 (two) times daily. 60 g 4  . spironolactone (ALDACTONE) 50 MG tablet Take 50 mg by mouth daily.    . traMADol (ULTRAM) 50 MG tablet Take 1 tablet (50 mg total) by mouth every 6 (six) hours as needed. 30 tablet 0  . triamcinolone (NASACORT AQ) 55 MCG/ACT AERO nasal inhaler Place 2 sprays into the nose daily. (Patient taking differently: Place 2 sprays into the nose as needed. ) 1 Inhaler 12  . triamcinolone cream (KENALOG) 0.1 % Apply 1 application topically 2 (two) times daily. 45 g 2  . amLODipine (NORVASC) 10 MG tablet Take 1 tablet by mouth once daily (Patient not taking: Reported on 01/09/2019) 90 tablet 0  . diltiazem 2 % GEL Apply 1 application topically 3 (three) times daily. (Patient not taking: Reported on 01/09/2019) 30 g 3  . potassium chloride (KLOR-CON) 10 MEQ tablet Take 2 tablets (20 mEq total) by mouth daily. (Patient not taking: Reported on 01/09/2019) 180 tablet 3   No current facility-administered medications on file prior to visit.    Observations/Objective: Alert, NAD, appropriate mood and affect, resps normal, cn 2-12 intact, moves all 4s, no visible rash or swelling Lab Results  Component Value Date   WBC 6.4 09/28/2018   HGB 12.4 09/28/2018   HCT 36.7 09/28/2018   PLT 336.0 09/28/2018   GLUCOSE 83 10/10/2018   CHOL 232 (H) 09/28/2018   TRIG 189.0 (H) 09/28/2018   HDL 37.50 (L) 09/28/2018   LDLDIRECT 127.0 07/11/2017   LDLCALC 157 (H) 09/28/2018   ALT 16 09/28/2018   AST 20 09/28/2018   NA 140 10/10/2018   K 3.1 (L) 10/10/2018   CL 101 10/10/2018   CREATININE 0.72 10/10/2018   BUN 11 10/10/2018   CO2 30 10/10/2018   TSH 1.03 09/28/2018    Assessment and Plan: See notes  Follow Up Instructions: See notes   I discussed the assessment and treatment plan with the patient. The patient was provided an opportunity to ask questions and all were answered. The patient agreed with the plan and demonstrated an  understanding of the instructions.   The patient was advised to call back or seek an in-person evaluation if the symptoms worsen or if the condition fails to improve as anticipated.   Cathlean Cower, MD

## 2019-01-11 NOTE — Telephone Encounter (Signed)
VV completed. Closing note.

## 2019-01-13 ENCOUNTER — Encounter: Payer: Self-pay | Admitting: Internal Medicine

## 2019-01-13 NOTE — Assessment & Plan Note (Signed)
stable overall by history and exam, recent data reviewed with pt, and pt to continue medical treatment as before,  to f/u any worsening symptoms or concerns  

## 2019-01-13 NOTE — Patient Instructions (Signed)
Ok for hydralazine 50 tid, cont to monitor BP at home and f/u with renal next wk as planned  Please continue all other medications as before, and refills have been done if requested.  Please have the pharmacy call with any other refills you may need.  Please keep your appointments with your specialists as you may have planned

## 2019-02-11 ENCOUNTER — Other Ambulatory Visit: Payer: Self-pay | Admitting: Internal Medicine

## 2019-02-11 NOTE — Telephone Encounter (Signed)
Please refill as per office routine med refill policy (all routine meds refilled for 3 mo or monthly per pt preference up to one year from last visit, then month to month grace period for 3 mo, then further med refills will have to be denied)  

## 2019-02-22 ENCOUNTER — Ambulatory Visit (INDEPENDENT_AMBULATORY_CARE_PROVIDER_SITE_OTHER): Payer: BC Managed Care – PPO | Admitting: Internal Medicine

## 2019-02-22 ENCOUNTER — Other Ambulatory Visit: Payer: Self-pay

## 2019-02-22 ENCOUNTER — Encounter: Payer: Self-pay | Admitting: Internal Medicine

## 2019-02-22 DIAGNOSIS — F411 Generalized anxiety disorder: Secondary | ICD-10-CM

## 2019-02-22 DIAGNOSIS — I10 Essential (primary) hypertension: Secondary | ICD-10-CM | POA: Diagnosis not present

## 2019-02-22 DIAGNOSIS — H1013 Acute atopic conjunctivitis, bilateral: Secondary | ICD-10-CM

## 2019-02-22 DIAGNOSIS — J3089 Other allergic rhinitis: Secondary | ICD-10-CM | POA: Diagnosis not present

## 2019-02-22 MED ORDER — AZELASTINE HCL 0.05 % OP SOLN: 1.0000 [drp] | Freq: Two times a day (BID) | OPHTHALMIC | 12 refills | Status: DC

## 2019-02-22 MED ORDER — TRIAMCINOLONE ACETONIDE 55 MCG/ACT NA AERO: 2.0000 | INHALATION_SPRAY | Freq: Every day | NASAL | 12 refills | Status: DC

## 2019-02-22 MED ORDER — PREDNISONE 10 MG PO TABS: ORAL_TABLET | ORAL | 0 refills | Status: DC

## 2019-02-22 NOTE — Assessment & Plan Note (Signed)
To continue check BP at home and next visit

## 2019-02-22 NOTE — Assessment & Plan Note (Signed)
Also for nasacort asd,  to f/u any worsening symptoms or concerns  

## 2019-02-22 NOTE — Progress Notes (Signed)
Patient ID: Kristie Nelson, female   DOB: 22-Aug-1980, 39 y.o.   MRN: [id number]  Virtual Visit via Video Note  I connected with Kristie Nelson on 02/22/19 at  3:20 PM EST by a video enabled telemedicine application and verified that I am speaking with the correct person using two identifiers.  Location: Patient: at home Provider: at office   I discussed the limitations of evaluation and management by telemedicine and the availability of in person appointments. The patient expressed understanding and agreed to proceed.  History of Present Illness: Here with flare of mod to severe eye itch and puffiness and slight clear dc for last 2 wks just not better with current meds, though has not been taking her nasal steroid.  Has seen allergy last yr and determined to have several allergies, not taking the allergy shots for now.  Has some mild nasal allergy symptoms as well, without fever, colored D/C, HA, ST and Pt denies chest pain, increased sob or doe, wheezing, orthopnea, PND, increased LE swelling, palpitations, dizziness or syncope.  BP at home < 140/90.  Denies worsening depressive symptoms, suicidal ideation, or panic Past Medical History:  Diagnosis Date  . ALLERGIC RHINITIS 04/03/2007  . Anal fissure   . ANXIETY 04/03/2007  . DEPRESSION 04/03/2007  . Dysrhythmia    palpitations due to stress  . ECZEMA 03/19/2009  . Heartburn in pregnancy   . Hemorrhoids   . HYPERLIPIDEMIA 12/09/2009  . HYPERTENSION 04/03/2007  . MIGRAINE, COMMON 12/09/2009  . Postoperative anemia due to acute blood loss 08/30/2012  . Shortness of breath    with anxiety   Past Surgical History:  Procedure Laterality Date  . CESAREAN SECTION  2010  . CESAREAN SECTION N/A 08/29/2012   Procedure: Repeat CESAREAN SECTION;  Surgeon: Lovenia Kim, MD;  Location: [facility];  Service: Obstetrics;  Laterality: N/A;  EDD: 09/17/12;REQUEST DEE;[name]    reports that she has never smoked. She has never used smokeless tobacco. She  reports that she does not drink alcohol or use drugs. family history includes Diabetes in her brother, father, maternal grandmother, and mother; Heart disease in her mother; Hyperlipidemia in her maternal grandmother and mother; Hypertension in her brother, father, maternal grandfather, maternal grandmother, and mother; Liver cancer in her maternal grandfather; Thyroid disease in her maternal grandmother. Allergies  Allergen Reactions  . Latex Itching    swelling  . Peanut-Containing Drug Products     REACTION: throat swelling  . Penicillins     REACTION: itching   Current Outpatient Medications on File Prior to Visit  Medication Sig Dispense Refill  . AMBULATORY NON FORMULARY MEDICATION Diltiazem 2% mixed with Lidocaine 5%, ratio 1:1  Apply to rectum 2-3 times a day 30 g 2  . amLODipine (NORVASC) 10 MG tablet Take 1 tablet by mouth once daily 90 tablet 0  . cetirizine (ZYRTEC) 10 MG tablet Take 1 tablet (10 mg total) by mouth daily. (Patient taking differently: Take 10 mg by mouth as needed. ) 30 tablet 11  . cyclobenzaprine (FLEXERIL) 5 MG tablet TAKE 1 TABLET BY MOUTH THREE TIMES DAILY AS NEEDED FOR MUSCLE SPASM 30 tablet 2  . diltiazem 2 % GEL Apply 1 application topically 3 (three) times daily. (Patient not taking: Reported on 01/09/2019) 30 g 3  . Epinastine HCl 0.05 % ophthalmic solution Place 1 drop into both eyes 2 (two) times daily. 10 mL 12  . hydrALAZINE (APRESOLINE) 50 MG tablet Take 1 tablet (50 mg total) by mouth  3 (three) times daily. 90 tablet 5  . ibuprofen (ADVIL) 800 MG tablet Take 1 tablet (800 mg total) by mouth every 8 (eight) hours as needed. 30 tablet 0  . Lidocaine-Hydrocortisone Ace 3-2.5 % KIT Use as directed every 12 hrs as needed 1 each 0  . metoprolol succinate (TOPROL-XL) 100 MG 24 hr tablet Take 1 tablet (100 mg total) by mouth daily. Take with or immediately following a meal. 90 tablet 3  . mometasone (ELOCON) 0.1 % ointment Apply topically daily. 45 g 0  .  Olopatadine HCl (PAZEO) 0.7 % SOLN Place 1 drop into both eyes daily. 2.5 mL 5  . pimecrolimus (ELIDEL) 1 % cream Apply topically 2 (two) times daily. 60 g 4  . potassium chloride (KLOR-CON) 10 MEQ tablet Take 2 tablets (20 mEq total) by mouth daily. (Patient not taking: Reported on 01/09/2019) 180 tablet 3  . spironolactone (ALDACTONE) 50 MG tablet Take 50 mg by mouth daily.    . traMADol (ULTRAM) 50 MG tablet Take 1 tablet (50 mg total) by mouth every 6 (six) hours as needed. 30 tablet 0  . triamcinolone cream (KENALOG) 0.1 % Apply 1 application topically 2 (two) times daily. 45 g 2   No current facility-administered medications on file prior to visit.    Observations/Objective: Alert, NAD, appropriate mood and affect, resps normal, cn 2-12 intact, moves all 4s, no visible rash or swelling Lab Results  Component Value Date   WBC 6.4 09/28/2018   HGB 12.4 09/28/2018   HCT 36.7 09/28/2018   PLT 336.0 09/28/2018   GLUCOSE 83 10/10/2018   CHOL 232 (H) 09/28/2018   TRIG 189.0 (H) 09/28/2018   HDL 37.50 (L) 09/28/2018   LDLDIRECT 127.0 07/11/2017   LDLCALC 157 (H) 09/28/2018   ALT 16 09/28/2018   AST 20 09/28/2018   NA 140 10/10/2018   K 3.1 (L) 10/10/2018   CL 101 10/10/2018   CREATININE 0.72 10/10/2018   BUN 11 10/10/2018   CO2 30 10/10/2018   TSH 1.03 09/28/2018   Assessment and Plan: See notes  Follow Up Instructions: See notes   I discussed the assessment and treatment plan with the patient. The patient was provided an opportunity to ask questions and all were answered. The patient agreed with the plan and demonstrated an understanding of the instructions.   The patient was advised to call back or seek an in-person evaluation if the symptoms worsen or if the condition fails to improve as anticipated.  Cathlean Cower, MD

## 2019-02-22 NOTE — Patient Instructions (Signed)
Please take all new medication as prescribed - the optivar and prednisone  Please continue all other medications as before, and nasacort  Please have the pharmacy call with any other refills you may need.  Please continue your efforts at being more active, low cholesterol diet, and weight control.  Please keep your appointments with your specialists as you may have planned

## 2019-02-22 NOTE — Assessment & Plan Note (Addendum)
With moderate flare, for optivar asd and predpac asd  I spent 31 minutes in preparing to see the patient by review of recent labs, imaging and procedures, obtaining and reviewing separately obtained history, communicating with the patient and family or caregiver, ordering medications, tests or procedures, and documenting clinical information in the EHR including the differential Dx, treatment, and any further evaluation and other management of eye and nasal allergies, HTN, anxiety

## 2019-02-22 NOTE — Assessment & Plan Note (Signed)
stable overall by history and exam, recent data reviewed with pt, and pt to continue medical treatment as before,  to f/u any worsening symptoms or concerns  

## 2019-03-19 ENCOUNTER — Telehealth: Payer: Self-pay

## 2019-03-19 NOTE — Telephone Encounter (Signed)
New message    The patient calling wanted to know if Dr. Jonny Ruiz will take her husband on as a new patient.

## 2019-03-19 NOTE — Telephone Encounter (Signed)
Can we please clarify, as I would need name and DOB to assess the current record

## 2019-03-20 NOTE — Telephone Encounter (Signed)
Governor Specking  Pund 08/06/1967

## 2019-03-20 NOTE — Telephone Encounter (Signed)
Ok with me to accept husband as pt

## 2019-03-21 NOTE — Telephone Encounter (Signed)
Addendum   The patient husband appt is made on 3.31.21 @ 10:00 am

## 2019-06-20 ENCOUNTER — Other Ambulatory Visit: Payer: Self-pay | Admitting: Internal Medicine

## 2019-06-20 NOTE — Telephone Encounter (Signed)
Please refill as per office routine med refill policy (all routine meds refilled for 3 mo or monthly per pt preference up to one year from last visit, then month to month grace period for 3 mo, then further med refills will have to be denied)  

## 2019-06-27 ENCOUNTER — Ambulatory Visit: Payer: BC Managed Care – PPO | Admitting: Family Medicine

## 2019-06-27 ENCOUNTER — Ambulatory Visit: Payer: Self-pay

## 2019-06-27 ENCOUNTER — Other Ambulatory Visit: Payer: Self-pay

## 2019-06-27 ENCOUNTER — Encounter: Payer: Self-pay | Admitting: Family Medicine

## 2019-06-27 VITALS — BP 118/80 | HR 93 | Ht 64.0 in | Wt 153.0 lb

## 2019-06-27 DIAGNOSIS — M25562 Pain in left knee: Secondary | ICD-10-CM | POA: Diagnosis not present

## 2019-06-27 MED ORDER — PREDNISONE 10 MG PO TABS: 30.0000 mg | ORAL_TABLET | Freq: Every day | ORAL | 0 refills | Status: DC

## 2019-06-27 NOTE — Patient Instructions (Addendum)
Thank you for coming in today. Try using some voltaren gel or Pennsaid on the knee.  I think this is prepatellar bursitis.  Consider adding compression sleeve like body helix full knee sleeve.   I recommend you obtained a compression sleeve to help with your joint problems. There are many options on the market however I recommend obtaining a Knee Body Helix compression sleeve.  You can find information (including how to appropriate measure yourself for sizing) can be found at www.Body http://www.lambert.com/.  Many of these products are health savings account (HSA) eligible.   You can use the compression sleeve at any time throughout the day but is most important to use while being active as well as for 2 hours post-activity.   It is appropriate to ice following activity with the compression sleeve in place.   I will prescribe prednisone. Start the prednisone if not improving.   Do the exercises included when starting to feel better.   If not improving let me know. Could consider injection.     Prepatellar Bursitis Rehab Ask your health care provider which exercises are safe for you. Do exercises exactly as told by your health care provider and adjust them as directed. It is normal to feel mild stretching, pulling, tightness, or discomfort as you do these exercises. Stop right away if you feel sudden pain or your pain gets worse. Do not begin these exercises until told by your health care provider. Stretching and range-of-motion exercises These exercises warm up your muscles and joints and improve the movement and flexibility of your knee. These exercises also help to relieve pain, numbness, and tingling. Hamstring stretch, standing This is an exercise in which you prop your leg on a chair and lean forward to achieve a stretch. To do this exercise: 1. Stand with your left / right heel resting on a chair. Your left / right leg should be fully extended. 2. Arch your lower back slightly. 3. Lean forward at  the waist, leading with your chest, until you feel a gentle stretch in the back of your left / right knee or thigh. You should not need to lean far to feel the stretch. 4. Hold this position for __________ seconds. Repeat __________ times. Complete this exercise __________ times a day. Knee flexion, active  1. Lie on your back with both knees straight. If this causes back discomfort, bend your healthy knee so your foot is flat on the floor. 2. Slowly slide your left / right heel back toward your buttocks (knee flexion). Stop when you feel a gentle stretch in the front of your knee or thigh. 3. Hold this position for __________ seconds. 4. Slowly slide your left / right heel back to the starting position. Repeat __________ times. Complete this exercise __________ times a day. Strengthening exercises These exercises build strength and endurance in your knee. Endurance is the ability to use your muscles for a long time, even after they get tired. Quadriceps, isometric This exercise stretches the muscles in the front of your thigh (quadriceps) without moving your knee joint (isometric). 1. Lie on your back with your left / right leg extended and your other knee bent. If told by your health care provider, put a rolled towel or a small pillow under your left / right knee. 2. Slowly tense the muscles in the front of your left / right thigh. You should see your kneecap slide up toward your hip or see increased dimpling just above your knee. This motion will  push the back of your knee down toward the surface that is under it. 3. For __________ seconds, hold the muscles as tight as you can without increasing your pain. 4. Relax the muscles slowly and completely after each repetition. Repeat __________ times. Complete this exercise __________ times a day. Straight leg raises, supine This exercise is sometimes called a quadriceps and hip flexor exercise. 1. Lie on your back (supine position) with your left  / right leg extended and your healthy knee bent. 2. Slowly tense the muscles in the front of your left / right thigh (quadriceps). You should see your kneecap slide up or see increased dimpling just above the knee. 3. Tighten these muscles even more as you raise your leg 4-6 inches (10-15 cm) off the floor. Do not let your knee bend. 4. Hold this position for __________ seconds. 5. Keep the thigh muscles tense as you lower your leg. 6. Relax your muscles slowly and completely after each repetition. Repeat __________ times. Complete this exercise __________ times a day. Straight leg raises, prone This is an exercise in which you stretch the muscles in the front and back of your thigh (hip extensors). 1. Lie on your abdomen (prone position) on a firm surface. You can put a pillow under your hips if that is more comfortable for your lower back. 2. Squeeze your buttocks muscles and lift your left / right leg about 4-6 inches (10-15 cm). Keep your knee straight as you lift your leg. 3. Hold this position for __________ seconds. 4. Slowly lower your leg to the starting position. 5. Let your muscles relax completely after each repetition. Repeat __________ times. Complete this exercise __________ times a day. This information is not intended to replace advice given to you by your health care provider. Make sure you discuss any questions you have with your health care provider. Document Revised: 04/16/2018 Document Reviewed: 04/16/2018 Elsevier Patient Education  2020 [employer].

## 2019-06-27 NOTE — Progress Notes (Signed)
[name], am serving as a Neurosurgeon for Dr. Clementeen Graham.  Kristie Nelson is a 39 y.o. female who presents to [facility] today for f/u of B knee pain, L >R.  She was last seen by Dr. Katrinka Blazing on 11/15/18 for B knee pain and was provided a HEP, Pennsaid samples and advised on vitamins/supplements.  Since her last visit, pt reports Tuesday was working from home and L knee started throbbing above the knee cap. Was unable to put pressure on that knee. Patient was having trouble walking on Tuesday and Wednesday but today is able to walk, but still with a limp and slowly. Patient did not hear any pop or anything and the tylenol she was taking did not help the pain.  Diagnostic imaging: B standing knee XR- 11/15/18  Pertinent review of systems: No fevers or chills  Relevant historical information: History of patellofemoral arthralgia.   Exam:  BP 118/80 (BP Location: Left Arm, Patient Position: Sitting, Cuff Size: Normal)   Pulse 93   Ht 5\' 4"  (1.626 m)   Wt 153 lb (69.4 kg)   SpO2 98%   BMI 26.26 kg/m  General: Well Developed, well nourished, and in no acute distress.   MSK: Left knee normal-appearing Tender palpation overlying patella and patellar tendon. Full motion however pain with extension with mild crepitation. Palpable squeak overlying patella tendon. Stable ligamentous exam. Guarding and some pain with resisted knee extension strength testing.  Intact flexion strength testing. Mild antalgic gait.    Lab and Radiology Results  Diagnostic Limited MSK Ultrasound of: Left knee Patellar tendon intact normal-appearing Tiny joint effusion superior patellar space. Patellar tendon is intact.  Hypoechoic fluid tracking superficial to patellar tendon indicating mild prepatellar bursitis. Small amount of hyperechoic change at distal tendon insertion indicating mild chronic calcific tendinopathy. Medial and lateral joint line slightly narrowed  otherwise normal. No significant Baker's cyst posterior knee. Impression: Prepatellar bursitis and mild calcific insertional patellar tendinitis.     Assessment and Plan: 39 y.o. female with left knee pain anterior knee ongoing for a few days.  Pain due to prepatellar bursitis and mild patellar tendinitis.  Unclear fundamental cause.  It is likely that change in her activity level because of the pain however we do not have a clear smoking gun.  I think it is not impossible that she has a rheumatologic process.  However if this pain returns repeatedly would proceed with rheumatologic work-up.  For now we will treat with compression topical Voltaren gel and home exercise program.  If not improving I have prescribed prednisone that she can start taking.  If still not better patient will notify me and return to clinic proceed with injection.  Recheck back in about 6 weeks.   PDMP not reviewed this encounter. Orders Placed This Encounter  Procedures  . 46 LIMITED JOINT SPACE STRUCTURES LOW LEFT    Standing Status:   Future    Number of Occurrences:   1    Standing Expiration Date:   06/26/2020    Order Specific Question:   Reason for Exam (SYMPTOM  OR DIAGNOSIS REQUIRED)    Answer:   Left knee pain    Order Specific Question:   Preferred imaging location?    Answer:   [facility] ordered this encounter  Medications  . predniSONE (DELTASONE) 10 MG tablet    Sig: Take 3 tablets (30 mg total) by mouth daily with breakfast.  Dispense:  15 tablet    Refill:  0     Discussed warning signs or symptoms. Please see discharge instructions. Patient expresses understanding.   The above documentation has been reviewed and is accurate and complete Lynne Leader, M.D.

## 2019-09-26 ENCOUNTER — Other Ambulatory Visit: Payer: Self-pay | Admitting: Internal Medicine

## 2019-10-08 ENCOUNTER — Telehealth (INDEPENDENT_AMBULATORY_CARE_PROVIDER_SITE_OTHER): Payer: BC Managed Care – PPO | Admitting: Internal Medicine

## 2019-10-08 ENCOUNTER — Encounter: Payer: Self-pay | Admitting: Internal Medicine

## 2019-10-08 DIAGNOSIS — B349 Viral infection, unspecified: Secondary | ICD-10-CM

## 2019-10-08 NOTE — Progress Notes (Signed)
Virtual Visit via Video Note  I connected with Kristie Nelson on 10/08/19 at  2:00 PM EDT by a video enabled telemedicine application and verified that I am speaking with the correct person using two identifiers.   I discussed the limitations of evaluation and management by telemedicine and the availability of in person appointments. The patient expressed understanding and agreed to proceed.  Present for the visit:  Myself, Dr Cheryll Cockayne, Kristie Nelson.  The patient is currently at home and I am in the office.    No referring provider.    History of Present Illness: This is an acute visit for chills, fever, headache, upset stomach and body aches.  Her symptoms started yesterday.  Last night she had chills, fever (100), headache and nausea.      She is a Runner, broadcasting/film/video.  She has not gotten tested for covid yet.  She has had the vaccine.   Review of Systems  Constitutional: Positive for chills and fever (Tmax 102).  HENT: Negative for congestion, ear pain, sinus pain and sore throat.   Respiratory: Negative for cough, shortness of breath and wheezing.   Gastrointestinal: Positive for abdominal pain (mild) and nausea. Negative for diarrhea.  Musculoskeletal: Positive for myalgias.  Neurological: Positive for headaches.      Social History   Socioeconomic History   Marital status: Married    Spouse name: Not on file   Number of children: 2   Years of education: Not on file   Highest education level: Not on file  Occupational History   Occupation: TEACHER    Employer: [employer]  Tobacco Use   Smoking status: Never Smoker   Smokeless tobacco: Never Used  Building services engineer Use: Never used  Substance and Sexual Activity   Alcohol use: No    Alcohol/week: 0.0 standard drinks   Drug use: No   Sexual activity: Not on file  Other Topics Concern   Not on file  Social History Narrative   Kindergarten teacher - Kristie Nelson   Married, 1 son 2010 and 1 daughter  2014   02/25/2016   Social Determinants of Health   Financial Resource Strain:    Difficulty of Paying Living Expenses: Not on file  Food Insecurity:    Worried About Programme researcher, broadcasting/film/video in the Last Year: Not on file   [employer] of Food in the Last Year: Not on file  Transportation Needs:    Lack of Transportation (Medical): Not on file   Lack of Transportation (Non-Medical): Not on file  Physical Activity:    Days of Exercise per Week: Not on file   Minutes of Exercise per Session: Not on file  Stress:    Feeling of Stress : Not on file  Social Connections:    Frequency of Communication with Friends and Family: Not on file   Frequency of Social Gatherings with Friends and Family: Not on file   Attends Religious Services: Not on file   Active Member of Clubs or Organizations: Not on file   Attends Banker Meetings: Not on file   Marital Status: Not on file     Observations/Objective: Appears well in NAD Breathing normally Skin appears warm and dry  Assessment and Plan:   Viral syndrome: Acute Concern for covid - advised she get tested as soon as possible and if there is any difficulty getting tested in the next 24 hrs to call us If positive will refer for Ab infusion  If negative may need to be re-evaluated Symptomatic treatment at this time    See Problem List for Assessment and Plan of chronic medical problems.   Follow Up Instructions:    I discussed the assessment and treatment plan with the patient. The patient was provided an opportunity to ask questions and all were answered. The patient agreed with the plan and demonstrated an understanding of the instructions.   The patient was advised to call back or seek an in-person evaluation if the symptoms worsen or if the condition fails to improve as anticipated.    Pincus Sanes, MD

## 2019-10-28 ENCOUNTER — Other Ambulatory Visit: Payer: Self-pay

## 2019-10-29 ENCOUNTER — Encounter: Payer: BC Managed Care – PPO | Admitting: Internal Medicine

## 2019-11-12 ENCOUNTER — Other Ambulatory Visit: Payer: Self-pay | Admitting: Internal Medicine

## 2019-11-13 NOTE — Telephone Encounter (Signed)
Please refill as per office routine med refill policy (all routine meds refilled for 3 mo or monthly per pt preference up to one year from last visit, then month to month grace period for 3 mo, then further med refills will have to be denied)  

## 2019-11-15 ENCOUNTER — Encounter: Payer: BC Managed Care – PPO | Admitting: Internal Medicine

## 2019-12-20 ENCOUNTER — Encounter: Payer: BC Managed Care – PPO | Admitting: Internal Medicine

## 2019-12-25 ENCOUNTER — Other Ambulatory Visit: Payer: Self-pay | Admitting: Internal Medicine

## 2019-12-25 NOTE — Telephone Encounter (Signed)
Please refill as per office routine med refill policy (all routine meds refilled for 3 mo or monthly per pt preference up to one year from last visit, then month to month grace period for 3 mo, then further med refills will have to be denied)  

## 2020-01-23 ENCOUNTER — Other Ambulatory Visit: Payer: Self-pay

## 2020-01-24 ENCOUNTER — Encounter: Payer: BC Managed Care – PPO | Admitting: Internal Medicine

## 2020-02-09 ENCOUNTER — Other Ambulatory Visit: Payer: Self-pay | Admitting: Internal Medicine

## 2020-02-09 NOTE — Telephone Encounter (Signed)
Please refill as per office routine med refill policy (all routine meds refilled for 3 mo or monthly per pt preference up to one year from last visit, then month to month grace period for 3 mo, then further med refills will have to be denied)  

## 2020-03-16 ENCOUNTER — Other Ambulatory Visit: Payer: Self-pay

## 2020-03-17 ENCOUNTER — Ambulatory Visit (INDEPENDENT_AMBULATORY_CARE_PROVIDER_SITE_OTHER): Payer: BC Managed Care – PPO | Admitting: Internal Medicine

## 2020-03-17 ENCOUNTER — Encounter: Payer: Self-pay | Admitting: Internal Medicine

## 2020-03-17 VITALS — BP 122/70 | HR 73 | Ht 64.0 in | Wt 153.0 lb

## 2020-03-17 DIAGNOSIS — F411 Generalized anxiety disorder: Secondary | ICD-10-CM | POA: Diagnosis not present

## 2020-03-17 DIAGNOSIS — Z0001 Encounter for general adult medical examination with abnormal findings: Secondary | ICD-10-CM

## 2020-03-17 DIAGNOSIS — M5441 Lumbago with sciatica, right side: Secondary | ICD-10-CM | POA: Diagnosis not present

## 2020-03-17 DIAGNOSIS — I10 Essential (primary) hypertension: Secondary | ICD-10-CM

## 2020-03-17 DIAGNOSIS — E559 Vitamin D deficiency, unspecified: Secondary | ICD-10-CM | POA: Diagnosis not present

## 2020-03-17 DIAGNOSIS — Z1159 Encounter for screening for other viral diseases: Secondary | ICD-10-CM

## 2020-03-17 DIAGNOSIS — E538 Deficiency of other specified B group vitamins: Secondary | ICD-10-CM | POA: Diagnosis not present

## 2020-03-17 DIAGNOSIS — E7800 Pure hypercholesterolemia, unspecified: Secondary | ICD-10-CM

## 2020-03-17 LAB — BASIC METABOLIC PANEL
BUN: 18 mg/dL (ref 6–23)
CO2: 30 mEq/L (ref 19–32)
Calcium: 9.8 mg/dL (ref 8.4–10.5)
Chloride: 102 mEq/L (ref 96–112)
Creatinine, Ser: 0.76 mg/dL (ref 0.40–1.20)
GFR: 96.93 mL/min (ref >60.00)
Glucose, Bld: 96 mg/dL (ref 70–99)
Potassium: 3.8 mEq/L (ref 3.5–5.1)
Sodium: 137 mEq/L (ref 135–145)

## 2020-03-17 LAB — CBC WITH DIFFERENTIAL/PLATELET
Basophils Absolute: 0 10*3/uL (ref 0.0–0.1)
Basophils Relative: 0.5 % (ref 0.0–3.0)
Eosinophils Absolute: 0.2 10*3/uL (ref 0.0–0.7)
Eosinophils Relative: 1.8 % (ref 0.0–5.0)
HCT: 36 % (ref 36.0–46.0)
Hemoglobin: 12.2 g/dL (ref 12.0–15.0)
Lymphocytes Relative: 28.1 % (ref 12.0–46.0)
Lymphs Abs: 2.6 10*3/uL (ref 0.7–4.0)
MCHC: 33.8 g/dL (ref 30.0–36.0)
MCV: 92.2 fl (ref 78.0–100.0)
Monocytes Absolute: 0.8 10*3/uL (ref 0.1–1.0)
Monocytes Relative: 8.7 % (ref 3.0–12.0)
Neutro Abs: 5.7 10*3/uL (ref 1.4–7.7)
Neutrophils Relative %: 60.9 % (ref 43.0–77.0)
Platelets: 285 10*3/uL (ref 150.0–400.0)
RBC: 3.9 Mil/uL (ref 3.87–5.11)
RDW: 13.5 % (ref 11.5–15.5)
WBC: 9.4 10*3/uL (ref 4.0–10.5)

## 2020-03-17 LAB — HEPATIC FUNCTION PANEL
ALT: 13 U/L (ref 0–35)
AST: 17 U/L (ref 0–37)
Albumin: 4.4 g/dL (ref 3.5–5.2)
Alkaline Phosphatase: 39 U/L (ref 39–117)
Bilirubin, Direct: 0.1 mg/dL (ref 0.0–0.3)
Total Bilirubin: 0.4 mg/dL (ref 0.2–1.2)
Total Protein: 7.8 g/dL (ref 6.0–8.3)

## 2020-03-17 LAB — VITAMIN D 25 HYDROXY (VIT D DEFICIENCY, FRACTURES): VITD: 49.07 ng/mL (ref 30.00–100.00)

## 2020-03-17 LAB — LIPID PANEL
Cholesterol: 192 mg/dL (ref 0–200)
HDL: 39 mg/dL — ABNORMAL LOW (ref >39.00)
LDL Cholesterol: 127 mg/dL — ABNORMAL HIGH (ref 0–99)
NonHDL: 153.26
Total CHOL/HDL Ratio: 5
Triglycerides: 133 mg/dL (ref 0.0–149.0)
VLDL: 26.6 mg/dL (ref 0.0–40.0)

## 2020-03-17 LAB — VITAMIN B12: Vitamin B-12: 409 pg/mL (ref 211–911)

## 2020-03-17 LAB — TSH: TSH: 0.71 u[IU]/mL (ref 0.35–4.50)

## 2020-03-17 NOTE — Assessment & Plan Note (Signed)
Age and sex appropriate education and counseling updated with regular exercise and diet Referrals for preventative services - for hep c screen Immunizations addressed - none needed Smoking counseling  - none needed Evidence for depression or other mood disorder - none significant Most recent labs reviewed. I have personally reviewed and have noted: 1) the patient's medical and social history 2) The patient's current medications and supplements 3) The patient's height, weight, and BMI have been recorded in the chart  

## 2020-03-17 NOTE — Assessment & Plan Note (Addendum)
BP Readings from Last 3 Encounters:  03/17/20 122/70  06/27/19 118/80  01/09/19 (!) 160/90   Stable, pt to continue medical treatment norvasc, hydralazine, toprol, aldactone

## 2020-03-17 NOTE — Assessment & Plan Note (Signed)
Lab Results  Component Value Date   LDLCALC 157 (H) 09/28/2018   Stable, pt to continue current low chol det, declines statin for now

## 2020-03-17 NOTE — Assessment & Plan Note (Signed)
Overall stable, cont current treatment   Current Outpatient Medications (Cardiovascular):  .  amLODipine (NORVASC) 10 MG tablet, Take 1 tablet by mouth once daily .  diltiazem 2 % GEL*, Apply 1 application topically 3 (three) times daily. .  hydrALAZINE (APRESOLINE) 50 MG tablet, Take 1 tablet (50 mg total) by mouth 3 (three) times daily. .  metoprolol succinate (TOPROL-XL) 100 MG 24 hr tablet, TAKE 1 TABLET BY MOUTH ONCE DAILY WITH  OR  IMMEDIATELY  FOLLOWING  A  MEAL .  spironolactone (ALDACTONE) 50 MG tablet, Take 50 mg by mouth daily.  Current Outpatient Medications (Respiratory):  .  cetirizine (ZYRTEC) 10 MG tablet, Take 1 tablet (10 mg total) by mouth daily. (Patient taking differently: Take 10 mg by mouth as needed.) .  triamcinolone (NASACORT AQ) 55 MCG/ACT AERO nasal inhaler, Place 2 sprays into the nose daily.  Current Outpatient Medications (Analgesics):  .  ibuprofen (ADVIL) 800 MG tablet, Take 1 tablet (800 mg total) by mouth every 8 (eight) hours as needed. .  traMADol (ULTRAM) 50 MG tablet, Take 1 tablet (50 mg total) by mouth every 6 (six) hours as needed.   Current Outpatient Medications (Other):  Marland Kitchen  AMBULATORY NON FORMULARY MEDICATION, Diltiazem 2% mixed with Lidocaine 5%, ratio 1:1  Apply to rectum 2-3 times a day .  azelastine (OPTIVAR) 0.05 % ophthalmic solution, Place 1 drop into both eyes 2 (two) times daily. .  cyclobenzaprine (FLEXERIL) 5 MG tablet, TAKE 1 TABLET BY MOUTH THREE TIMES DAILY AS NEEDED FOR MUSCLE SPASM .  diltiazem 2 % GEL*, Apply 1 application topically 3 (three) times daily. Marland Kitchen  Epinastine HCl 0.05 % ophthalmic solution, Place 1 drop into both eyes 2 (two) times daily. .  Lidocaine-Hydrocortisone Ace 3-2.5 % KIT, Use as directed every 12 hrs as needed .  mometasone (ELOCON) 0.1 % ointment, Apply topically daily. .  Olopatadine HCl (PAZEO) 0.7 % SOLN, Place 1 drop into both eyes daily. .  pimecrolimus (ELIDEL) 1 % cream, Apply topically 2 (two)  times daily. .  potassium chloride (KLOR-CON) 10 MEQ tablet, Take 2 tablets (20 mEq total) by mouth daily. Marland Kitchen  triamcinolone cream (KENALOG) 0.1 %, Apply 1 application topically 2 (two) times daily. Marland Kitchen  COVID-19 Specimen Collection KIT, See admin instructions. for testing * These medications belong to multiple therapeutic classes and are listed under each applicable group.

## 2020-03-17 NOTE — Assessment & Plan Note (Signed)
With intermittent bilateral intermittent leg paresthesias, exam benign, ok to follow with any worsening s/s

## 2020-03-17 NOTE — Progress Notes (Signed)
Patient ID: Kristie Nelson, female   DOB: August 03, 1980, 40 y.o.   MRN: [id number]         Chief Complaint:: wellness exam and htn, hypokalemia, bilateral leg paresthesias, hld, anxiety       HPI:  Kristie Nelson is a 40 y.o. female here for wellness exam; due for hep c screening, o/w up to date with preventive referrals and immunizations                        Also c/o midline low back pain mild intermittent and worse to bending and twisting at the waist for > 3 mo, and associated with mild bilateral LE paresthesias only with sitting, but no other position; Pt denies bowel or bladder change, fever, wt loss, worsening LE weakness, gait change or falls.  Pt denies chest pain, increased sob or doe, wheezing, orthopnea, PND, increased LE swelling, palpitations, dizziness or syncope.   Pt denies polydipsia, polyuria, Denies other focal neuro s/s.   Pt denies fever, wt loss, night sweats, loss of appetite, or other constitutional symptoms  No other new complaiints.  Has with her feb 2022 labs per renal with normal K.     Wt Readings from Last 3 Encounters:  03/17/20 153 lb (69.4 kg)  06/27/19 153 lb (69.4 kg)  01/09/19 151 lb (68.5 kg)   BP Readings from Last 3 Encounters:  03/17/20 122/70  06/27/19 118/80  01/09/19 (!) 160/90   Immunization History  Administered Date(s) Administered  . Influenza Whole 11/16/2007, 11/19/2008  . Influenza,inj,Quad PF,6+ Mos 11/14/2012, 10/27/2015  . PFIZER Comirnaty(Gray Top)Covid-19 Tri-Sucrose Vaccine 03/16/2019, 04/09/2019, 11/02/2019  . Td 01/04/2004  . Tdap 08/30/2012   Health Maintenance Due  Topic Date Due  . Hepatitis C Screening  Never done      Past Medical History:  Diagnosis Date  . ALLERGIC RHINITIS 04/03/2007  . Anal fissure   . ANXIETY 04/03/2007  . DEPRESSION 04/03/2007  . Dysrhythmia    palpitations due to stress  . ECZEMA 03/19/2009  . Heartburn in pregnancy   . Hemorrhoids   . HYPERLIPIDEMIA 12/09/2009  . HYPERTENSION 04/03/2007  .  MIGRAINE, COMMON 12/09/2009  . Postoperative anemia due to acute blood loss 08/30/2012  . Shortness of breath    with anxiety   Past Surgical History:  Procedure Laterality Date  . CESAREAN SECTION  2010  . CESAREAN SECTION N/A 08/29/2012   Procedure: Repeat CESAREAN SECTION;  Surgeon: Lovenia Kim, MD;  Location: [facility];  Service: Obstetrics;  Laterality: N/A;  EDD: 09/17/12;REQUEST DEE;[name]    reports that she has never smoked. She has never used smokeless tobacco. She reports that she does not drink alcohol and does not use drugs. family history includes Diabetes in her brother, father, maternal grandmother, and mother; Heart disease in her mother; Hyperlipidemia in her maternal grandmother and mother; Hypertension in her brother, father, maternal grandfather, maternal grandmother, and mother; Liver cancer in her maternal grandfather; Thyroid disease in her maternal grandmother. Allergies  Allergen Reactions  . Dog Epithelium Itching  . Latex Itching    swelling  . Peanut-Containing Drug Products     REACTION: throat swelling  . Penicillins     REACTION: itching  . Tomato (Diagnostic) Itching and Swelling   Current Outpatient Medications on File Prior to Visit  Medication Sig Dispense Refill  . AMBULATORY NON FORMULARY MEDICATION Diltiazem 2% mixed with Lidocaine 5%, ratio 1:1  Apply to rectum 2-3 times a day  30 g 2  . amLODipine (NORVASC) 10 MG tablet Take 1 tablet by mouth once daily 90 tablet 0  . azelastine (OPTIVAR) 0.05 % ophthalmic solution Place 1 drop into both eyes 2 (two) times daily. 6 mL 12  . cetirizine (ZYRTEC) 10 MG tablet Take 1 tablet (10 mg total) by mouth daily. (Patient taking differently: Take 10 mg by mouth as needed.) 30 tablet 11  . cyclobenzaprine (FLEXERIL) 5 MG tablet TAKE 1 TABLET BY MOUTH THREE TIMES DAILY AS NEEDED FOR MUSCLE SPASM 30 tablet 2  . diltiazem 2 % GEL Apply 1 application topically 3 (three) times daily. 30 g 3  . Epinastine HCl 0.05  % ophthalmic solution Place 1 drop into both eyes 2 (two) times daily. 10 mL 12  . hydrALAZINE (APRESOLINE) 50 MG tablet Take 1 tablet (50 mg total) by mouth 3 (three) times daily. 90 tablet 5  . ibuprofen (ADVIL) 800 MG tablet Take 1 tablet (800 mg total) by mouth every 8 (eight) hours as needed. 30 tablet 0  . Lidocaine-Hydrocortisone Ace 3-2.5 % KIT Use as directed every 12 hrs as needed 1 each 0  . metoprolol succinate (TOPROL-XL) 100 MG 24 hr tablet TAKE 1 TABLET BY MOUTH ONCE DAILY WITH  OR  IMMEDIATELY  FOLLOWING  A  MEAL 90 tablet 0  . mometasone (ELOCON) 0.1 % ointment Apply topically daily. 45 g 0  . Olopatadine HCl (PAZEO) 0.7 % SOLN Place 1 drop into both eyes daily. 2.5 mL 5  . pimecrolimus (ELIDEL) 1 % cream Apply topically 2 (two) times daily. 60 g 4  . potassium chloride (KLOR-CON) 10 MEQ tablet Take 2 tablets (20 mEq total) by mouth daily. 180 tablet 3  . spironolactone (ALDACTONE) 50 MG tablet Take 50 mg by mouth daily.    . traMADol (ULTRAM) 50 MG tablet Take 1 tablet (50 mg total) by mouth every 6 (six) hours as needed. 30 tablet 0  . triamcinolone (NASACORT AQ) 55 MCG/ACT AERO nasal inhaler Place 2 sprays into the nose daily. 1 Inhaler 12  . triamcinolone cream (KENALOG) 0.1 % Apply 1 application topically 2 (two) times daily. 45 g 2  . COVID-19 Specimen Collection KIT See admin instructions. for testing     No current facility-administered medications on file prior to visit.        ROS:  All others reviewed and negative.  Objective        PE:  BP 122/70   Pulse 73   Ht _0  (1.626 m)   Wt 153 lb (69.4 kg)   SpO2 97%   BMI 26.26 kg/m                 Constitutional: Pt appears in NAD               HENT: Head: NCAT.                Right Ear: External ear normal.                 Left Ear: External ear normal.                Eyes: . Pupils are equal, round, and reactive to light. Conjunctivae and EOM are normal               Nose: without d/c or deformity                Neck: Neck supple. Gross normal ROM  Cardiovascular: Normal rate and regular rhythm.                 Pulmonary/Chest: Effort normal and breath sounds without rales or wheezing.                Abd:  Soft, NT, ND, + BS, no organomegaly               Neurological: Pt is alert. At baseline orientation, motor grossly intact               Skin: Skin is warm. No rashes, no other new lesions, LE edema - trace LLE               Psychiatric: Pt behavior is normal without agitation   Micro: none  Cardiac tracings I have personally interpreted today:  none  Pertinent Radiological findings (summarize): none   Lab Results  Component Value Date   WBC 6.4 09/28/2018   HGB 12.4 09/28/2018   HCT 36.7 09/28/2018   PLT 336.0 09/28/2018   GLUCOSE 83 10/10/2018   CHOL 232 (H) 09/28/2018   TRIG 189.0 (H) 09/28/2018   HDL 37.50 (L) 09/28/2018   LDLDIRECT 127.0 07/11/2017   LDLCALC 157 (H) 09/28/2018   ALT 16 09/28/2018   AST 20 09/28/2018   NA 140 10/10/2018   K 3.1 (L) 10/10/2018   CL 101 10/10/2018   CREATININE 0.72 10/10/2018   BUN 11 10/10/2018   CO2 30 10/10/2018   TSH 1.03 09/28/2018   Assessment/Plan:  Kristie Nelson is a 40 y.o. Black or African American [2] female with  has a past medical history of ALLERGIC RHINITIS (04/03/2007), Anal fissure, ANXIETY (04/03/2007), DEPRESSION (04/03/2007), Dysrhythmia, ECZEMA (03/19/2009), Heartburn in pregnancy, Hemorrhoids, HYPERLIPIDEMIA (12/09/2009), HYPERTENSION (04/03/2007), MIGRAINE, COMMON (12/09/2009), Postoperative anemia due to acute blood loss (08/30/2012), and Shortness of breath.  Encounter for well adult exam with abnormal findings Age and sex appropriate education and counseling updated with regular exercise and diet Referrals for preventative services - for hep c screen Immunizations addressed - none needed Smoking counseling  - none needed Evidence for depression or other mood disorder - none significant Most recent labs  reviewed. I have personally reviewed and have noted: 1) the patient's medical and social history 2) The patient's current medications and supplements 3) The patient's height, weight, and BMI have been recorded in the chart   Anxiety state Overall stable, cont current treatment   Current Outpatient Medications (Cardiovascular):  .  amLODipine (NORVASC) 10 MG tablet, Take 1 tablet by mouth once daily .  diltiazem 2 % GEL*, Apply 1 application topically 3 (three) times daily. .  hydrALAZINE (APRESOLINE) 50 MG tablet, Take 1 tablet (50 mg total) by mouth 3 (three) times daily. .  metoprolol succinate (TOPROL-XL) 100 MG 24 hr tablet, TAKE 1 TABLET BY MOUTH ONCE DAILY WITH  OR  IMMEDIATELY  FOLLOWING  A  MEAL .  spironolactone (ALDACTONE) 50 MG tablet, Take 50 mg by mouth daily.  Current Outpatient Medications (Respiratory):  .  cetirizine (ZYRTEC) 10 MG tablet, Take 1 tablet (10 mg total) by mouth daily. (Patient taking differently: Take 10 mg by mouth as needed.) .  triamcinolone (NASACORT AQ) 55 MCG/ACT AERO nasal inhaler, Place 2 sprays into the nose daily.  Current Outpatient Medications (Analgesics):  .  ibuprofen (ADVIL) 800 MG tablet, Take 1 tablet (800 mg total) by mouth every 8 (eight) hours as needed. .  traMADol (ULTRAM) 50 MG tablet, Take 1 tablet (50  mg total) by mouth every 6 (six) hours as needed.   Current Outpatient Medications (Other):  Marland Kitchen  AMBULATORY NON FORMULARY MEDICATION, Diltiazem 2% mixed with Lidocaine 5%, ratio 1:1  Apply to rectum 2-3 times a day .  azelastine (OPTIVAR) 0.05 % ophthalmic solution, Place 1 drop into both eyes 2 (two) times daily. .  cyclobenzaprine (FLEXERIL) 5 MG tablet, TAKE 1 TABLET BY MOUTH THREE TIMES DAILY AS NEEDED FOR MUSCLE SPASM .  diltiazem 2 % GEL*, Apply 1 application topically 3 (three) times daily. Marland Kitchen  Epinastine HCl 0.05 % ophthalmic solution, Place 1 drop into both eyes 2 (two) times daily. .  Lidocaine-Hydrocortisone Ace 3-2.5 %  KIT, Use as directed every 12 hrs as needed .  mometasone (ELOCON) 0.1 % ointment, Apply topically daily. .  Olopatadine HCl (PAZEO) 0.7 % SOLN, Place 1 drop into both eyes daily. .  pimecrolimus (ELIDEL) 1 % cream, Apply topically 2 (two) times daily. .  potassium chloride (KLOR-CON) 10 MEQ tablet, Take 2 tablets (20 mEq total) by mouth daily. Marland Kitchen  triamcinolone cream (KENALOG) 0.1 %, Apply 1 application topically 2 (two) times daily. Marland Kitchen  COVID-19 Specimen Collection KIT, See admin instructions. for testing * These medications belong to multiple therapeutic classes and are listed under each applicable group.   Essential hypertension BP Readings from Last 3 Encounters:  03/17/20 122/70  06/27/19 118/80  01/09/19 (!) 160/90   Stable, pt to continue medical treatment norvasc, hydralazine, toprol, aldactone   Hyperlipidemia Lab Results  Component Value Date   LDLCALC 157 (H) 09/28/2018   Stable, pt to continue current low chol det, declines statin for now   Low back pain With intermittent bilateral intermittent leg paresthesias, exam benign, ok to follow with any worsening s/s  Followup: Return in about 1 year (around 03/17/2021).  Cathlean Cower, MD 03/17/2020 8:28 PM [facility]

## 2020-03-17 NOTE — Patient Instructions (Signed)
Please check your blood pressure at home once per day for 10 days; the goal should be on average at least less than 130/80 (but not too low such as at your kidney visit earlier today)  Please continue all other medications as before, and refills have been done if requested.  Please have the pharmacy call with any other refills you may need.  Please continue your efforts at being more active, low cholesterol diet, and weight control.  You are otherwise up to date with prevention measures today.  Please keep your appointments with your specialists as you may have planned  Please go to the LAB at the blood drawing area for the tests to be done  You will be contacted by phone if any changes need to be made immediately.  Otherwise, you will receive a letter about your results with an explanation, but please check with MyChart first.  Please remember to sign up for MyChart if you have not done so, as this will be important to you in the future with finding out test results, communicating by private email, and scheduling acute appointments online when needed.  Please make an Appointment to return for your 1 year visit, or sooner if needed

## 2020-03-18 ENCOUNTER — Encounter: Payer: Self-pay | Admitting: Internal Medicine

## 2020-03-18 LAB — URINALYSIS, ROUTINE W REFLEX MICROSCOPIC
Bilirubin Urine: NEGATIVE
Hgb urine dipstick: NEGATIVE
Ketones, ur: NEGATIVE
Leukocytes,Ua: NEGATIVE
Nitrite: NEGATIVE
Specific Gravity, Urine: 1.01 (ref 1.000–1.030)
Total Protein, Urine: NEGATIVE
Urine Glucose: NEGATIVE
Urobilinogen, UA: 0.2 (ref 0.0–1.0)
pH: 7 (ref 5.0–8.0)

## 2020-03-18 LAB — HEPATITIS C ANTIBODY
Hepatitis C Ab: NONREACTIVE
SIGNAL TO CUT-OFF: 0.01 (ref <1.00)

## 2020-03-24 ENCOUNTER — Telehealth: Payer: Self-pay | Admitting: Internal Medicine

## 2020-03-24 MED ORDER — CYCLOBENZAPRINE HCL 5 MG PO TABS: 5.0000 mg | ORAL_TABLET | Freq: Three times a day (TID) | ORAL | 2 refills | Status: DC | PRN

## 2020-03-24 NOTE — Telephone Encounter (Signed)
Patient calling to report continued back pain. She is requesting new order for cyclobenzaprine (FLEXERIL) 5 MG tablet   Pharmacy [facility] - [place], [place] - [address]

## 2020-03-24 NOTE — Telephone Encounter (Signed)
Ok this is done to [facility] 

## 2020-03-27 ENCOUNTER — Other Ambulatory Visit: Payer: Self-pay | Admitting: Internal Medicine

## 2020-03-27 NOTE — Telephone Encounter (Signed)
Please refill as per office routine med refill policy (all routine meds refilled for 3 mo or monthly per pt preference up to one year from last visit, then month to month grace period for 3 mo, then further med refills will have to be denied)  

## 2020-04-15 ENCOUNTER — Telehealth (INDEPENDENT_AMBULATORY_CARE_PROVIDER_SITE_OTHER): Payer: BC Managed Care – PPO | Admitting: Internal Medicine

## 2020-04-15 DIAGNOSIS — J329 Chronic sinusitis, unspecified: Secondary | ICD-10-CM

## 2020-04-15 DIAGNOSIS — I10 Essential (primary) hypertension: Secondary | ICD-10-CM

## 2020-04-15 DIAGNOSIS — J3089 Other allergic rhinitis: Secondary | ICD-10-CM | POA: Diagnosis not present

## 2020-04-15 MED ORDER — METOPROLOL SUCCINATE ER 25 MG PO TB24: 25.0000 mg | ORAL_TABLET | Freq: Every day | ORAL | 3 refills | Status: DC

## 2020-04-15 MED ORDER — LEVOFLOXACIN 500 MG PO TABS: 500.0000 mg | ORAL_TABLET | Freq: Every day | ORAL | 0 refills | Status: AC

## 2020-04-15 MED ORDER — PREDNISONE 10 MG PO TABS: ORAL_TABLET | ORAL | 0 refills | Status: DC

## 2020-04-15 MED ORDER — AMLODIPINE BESYLATE 5 MG PO TABS: 5.0000 mg | ORAL_TABLET | Freq: Every day | ORAL | 3 refills | Status: DC

## 2020-04-15 NOTE — Progress Notes (Signed)
Patient ID: Kristie Nelson, female   DOB: 1980-07-18, 40 y.o.   MRN: [id number]  Virtual Visit via Video Note  I connected with [name] on 04/18/20 at  8:40 AM EDT by a video enabled telemedicine application and verified that I am speaking with the correct person using two identifiers.  Location of all participants today Patient: at home Provider: at office   I discussed the limitations of evaluation and management by telemedicine and the availability of in person appointments. The patient expressed understanding and agreed to proceed.  History of Present Illness:  Here with 2-3 days acute onset fever, facial pain, pressure, headache, general weakness and malaise, and greenish d/c, with mild ST and cough, but pt denies chest pain, wheezing, increased sob or doe, orthopnea, PND, increased LE swelling, palpitations, dizziness or syncope.  Also Does have several wks ongoing nasal allergy symptoms with clearish congestion, itch and sneezing, without fever, pain, ST, cough, swelling or wheezing.  Also, BP at home has been persistently low such as today 104/60 despite recent decrease in toprol XL from 100 to 25 mg per renal Past Medical History:  Diagnosis Date  . ALLERGIC RHINITIS 04/03/2007  . Anal fissure   . ANXIETY 04/03/2007  . DEPRESSION 04/03/2007  . Dysrhythmia    palpitations due to stress  . ECZEMA 03/19/2009  . Heartburn in pregnancy   . Hemorrhoids   . HYPERLIPIDEMIA 12/09/2009  . HYPERTENSION 04/03/2007  . MIGRAINE, COMMON 12/09/2009  . Postoperative anemia due to acute blood loss 08/30/2012  . Shortness of breath    with anxiety   Past Surgical History:  Procedure Laterality Date  . CESAREAN SECTION  2010  . CESAREAN SECTION N/A 08/29/2012   Procedure: Repeat CESAREAN SECTION;  Surgeon: Lovenia Kim, MD;  Location: [facility];  Service: Obstetrics;  Laterality: N/A;  EDD: 09/17/12;REQUEST DEE;[name]    reports that she has never smoked. She has never used smokeless tobacco.  She reports that she does not drink alcohol and does not use drugs. family history includes Diabetes in her brother, father, maternal grandmother, and mother; Heart disease in her mother; Hyperlipidemia in her maternal grandmother and mother; Hypertension in her brother, father, maternal grandfather, maternal grandmother, and mother; Liver cancer in her maternal grandfather; Thyroid disease in her maternal grandmother. Allergies  Allergen Reactions  . Dog Epithelium Itching  . Latex Itching    swelling  . Peanut-Containing Drug Products     REACTION: throat swelling  . Penicillins     REACTION: itching  . Tomato (Diagnostic) Itching and Swelling   Current Outpatient Medications on File Prior to Visit  Medication Sig Dispense Refill  . AMBULATORY NON FORMULARY MEDICATION Diltiazem 2% mixed with Lidocaine 5%, ratio 1:1  Apply to rectum 2-3 times a day 30 g 2  . azelastine (OPTIVAR) 0.05 % ophthalmic solution Place 1 drop into both eyes 2 (two) times daily. 6 mL 12  . cetirizine (ZYRTEC) 10 MG tablet Take 1 tablet (10 mg total) by mouth daily. (Patient taking differently: Take 10 mg by mouth as needed.) 30 tablet 11  . COVID-19 Specimen Collection KIT See admin instructions. for testing    . cyclobenzaprine (FLEXERIL) 5 MG tablet Take 1 tablet (5 mg total) by mouth 3 (three) times daily as needed. for muscle spams 60 tablet 2  . diltiazem 2 % GEL Apply 1 application topically 3 (three) times daily. 30 g 3  . Epinastine HCl 0.05 % ophthalmic solution Place 1 drop into  both eyes 2 (two) times daily. 10 mL 12  . hydrALAZINE (APRESOLINE) 50 MG tablet Take 1 tablet (50 mg total) by mouth 3 (three) times daily. 90 tablet 5  . ibuprofen (ADVIL) 800 MG tablet Take 1 tablet (800 mg total) by mouth every 8 (eight) hours as needed. 30 tablet 0  . Lidocaine-Hydrocortisone Ace 3-2.5 % KIT Use as directed every 12 hrs as needed 1 each 0  . mometasone (ELOCON) 0.1 % ointment Apply topically daily. 45 g 0   . Olopatadine HCl (PAZEO) 0.7 % SOLN Place 1 drop into both eyes daily. 2.5 mL 5  . pimecrolimus (ELIDEL) 1 % cream Apply topically 2 (two) times daily. 60 g 4  . potassium chloride (KLOR-CON) 10 MEQ tablet Take 2 tablets (20 mEq total) by mouth daily. 180 tablet 3  . spironolactone (ALDACTONE) 50 MG tablet Take 50 mg by mouth daily.    . traMADol (ULTRAM) 50 MG tablet Take 1 tablet (50 mg total) by mouth every 6 (six) hours as needed. 30 tablet 0  . triamcinolone (NASACORT AQ) 55 MCG/ACT AERO nasal inhaler Place 2 sprays into the nose daily. 1 Inhaler 12  . triamcinolone cream (KENALOG) 0.1 % Apply 1 application topically 2 (two) times daily. 45 g 2   No current facility-administered medications on file prior to visit.   . Observations/Objective: Alert, NAD, appropriate mood and affect, resps normal, cn 2-12 intact, moves all 4s, no visible rash or swelling Lab Results  Component Value Date   WBC 9.4 03/17/2020   HGB 12.2 03/17/2020   HCT 36.0 03/17/2020   PLT 285.0 03/17/2020   GLUCOSE 96 03/17/2020   CHOL 192 03/17/2020   TRIG 133.0 03/17/2020   HDL 39.00 (L) 03/17/2020   LDLDIRECT 127.0 07/11/2017   LDLCALC 127 (H) 03/17/2020   ALT 13 03/17/2020   AST 17 03/17/2020   NA 137 03/17/2020   K 3.8 03/17/2020   CL 102 03/17/2020   CREATININE 0.76 03/17/2020   BUN 18 03/17/2020   CO2 30 03/17/2020   TSH 0.71 03/17/2020   Assessment and Plan: See notes  Follow Up Instructions: See notes   I discussed the assessment and treatment plan with the patient. The patient was provided an opportunity to ask questions and all were answered. The patient agreed with the plan and demonstrated an understanding of the instructions.   The patient was advised to call back or seek an in-person evaluation if the symptoms worsen or if the condition fails to improve as anticipated.  Domingo Sep, MD

## 2020-04-18 ENCOUNTER — Encounter: Payer: Self-pay | Admitting: Internal Medicine

## 2020-04-18 DIAGNOSIS — J329 Chronic sinusitis, unspecified: Secondary | ICD-10-CM | POA: Insufficient documentation

## 2020-04-18 NOTE — Assessment & Plan Note (Signed)
Mild to mod, for antibx course,  to f/u any worsening symptoms or concerns 

## 2020-04-18 NOTE — Assessment & Plan Note (Signed)
Mild to mod seasonal flare, for predpac asd,  to f/u any worsening symptoms or concerns 

## 2020-04-18 NOTE — Assessment & Plan Note (Signed)
C/w overcontrolled, ok to decrease the amlodipine 5 qd, cont all other tx

## 2020-04-18 NOTE — Patient Instructions (Signed)
Please take all new medication as prescribed  Ok to decrease the amloidpine to 5 mg

## 2020-06-10 ENCOUNTER — Ambulatory Visit: Payer: BC Managed Care – PPO | Admitting: Internal Medicine

## 2020-06-30 ENCOUNTER — Ambulatory Visit: Payer: BC Managed Care – PPO | Admitting: Internal Medicine

## 2020-06-30 ENCOUNTER — Other Ambulatory Visit: Payer: Self-pay

## 2020-06-30 ENCOUNTER — Encounter: Payer: Self-pay | Admitting: Internal Medicine

## 2020-06-30 VITALS — BP 138/84 | HR 86 | Temp 99.1°F | Ht 64.0 in | Wt 151.4 lb

## 2020-06-30 DIAGNOSIS — I10 Essential (primary) hypertension: Secondary | ICD-10-CM | POA: Diagnosis not present

## 2020-06-30 DIAGNOSIS — M545 Low back pain, unspecified: Secondary | ICD-10-CM

## 2020-06-30 DIAGNOSIS — K5909 Other constipation: Secondary | ICD-10-CM

## 2020-06-30 MED ORDER — HYDROCODONE-ACETAMINOPHEN 5-325 MG PO TABS: 1.0000 | ORAL_TABLET | Freq: Four times a day (QID) | ORAL | 0 refills | Status: DC | PRN

## 2020-06-30 MED ORDER — CYCLOBENZAPRINE HCL 5 MG PO TABS: 5.0000 mg | ORAL_TABLET | Freq: Three times a day (TID) | ORAL | 2 refills | Status: DC | PRN

## 2020-06-30 NOTE — Progress Notes (Signed)
Patient ID: Kristie Nelson, female   DOB: 09-17-1980, 40 y.o.   MRN: [id number]        Chief Complaint: follow up acute back pain       HPI:  Kristie Nelson is a 40 y.o. female here with acute onset left > right lower back pain x  2 wks, severe, constant, worse to twist about at the waist or lean forward, has had prior similar pain minor in the past and never lasted this long, no obvious lifting or activity to make worse.  Denies worsening reflux, abd pain, dysphagia, n/v, bowel change or blood.  Denies urinary symptoms such as dysuria, frequency, urgency, flank pain, hematuria or n/v, fever, chills.  Pt denies chest pain, increased sob or doe, wheezing, orthopnea, PND, increased LE swelling, palpitations, dizziness or syncope.  No prior hx of singificnat back dz or mri.  Pt denies bowel or bladder change, fever, wt loss,  worsening LE pain/numbness/weakness, gait change or falls.  Wt Readings from Last 3 Encounters:  06/30/20 151 lb 6.4 oz (68.7 kg)  03/17/20 153 lb (69.4 kg)  06/27/19 153 lb (69.4 kg)   BP Readings from Last 3 Encounters:  06/30/20 138/84  03/17/20 122/70  06/27/19 118/80         Past Medical History:  Diagnosis Date   ALLERGIC RHINITIS 04/03/2007   Anal fissure    ANXIETY 04/03/2007   DEPRESSION 04/03/2007   Dysrhythmia    palpitations due to stress   ECZEMA 03/19/2009   Heartburn in pregnancy    Hemorrhoids    HYPERLIPIDEMIA 12/09/2009   HYPERTENSION 04/03/2007   MIGRAINE, COMMON 12/09/2009   Postoperative anemia due to acute blood loss 08/30/2012   Shortness of breath    with anxiety   Past Surgical History:  Procedure Laterality Date   CESAREAN SECTION  2010   CESAREAN SECTION N/A 08/29/2012   Procedure: Repeat CESAREAN SECTION;  Surgeon: Lovenia Kim, MD;  Location: [facility];  Service: Obstetrics;  Laterality: N/A;  EDD: 09/17/12;REQUEST DEE;[name]    reports that she has never smoked. She has never used smokeless tobacco. She reports that she does not drink  alcohol and does not use drugs. family history includes Diabetes in her brother, father, maternal grandmother, and mother; Heart disease in her mother; Hyperlipidemia in her maternal grandmother and mother; Hypertension in her brother, father, maternal grandfather, maternal grandmother, and mother; Liver cancer in her maternal grandfather; Thyroid disease in her maternal grandmother. Allergies  Allergen Reactions   Dog Epithelium Itching   Latex Itching    swelling   Peanut-Containing Drug Products     REACTION: throat swelling   Penicillins     REACTION: itching   Tomato (Diagnostic) Itching and Swelling   Current Outpatient Medications on File Prior to Visit  Medication Sig Dispense Refill   AMBULATORY NON FORMULARY MEDICATION Diltiazem 2% mixed with Lidocaine 5%, ratio 1:1  Apply to rectum 2-3 times a day 30 g 2   amLODipine (NORVASC) 5 MG tablet Take 1 tablet (5 mg total) by mouth daily. 90 tablet 3   azelastine (OPTIVAR) 0.05 % ophthalmic solution Place 1 drop into both eyes 2 (two) times daily. 6 mL 12   cetirizine (ZYRTEC) 10 MG tablet Take 1 tablet (10 mg total) by mouth daily. (Patient taking differently: Take 10 mg by mouth as needed.) 30 tablet 11   COVID-19 Specimen Collection KIT See admin instructions. for testing     diltiazem 2 % GEL Apply 1  application topically 3 (three) times daily. 30 g 3   Epinastine HCl 0.05 % ophthalmic solution Place 1 drop into both eyes 2 (two) times daily. 10 mL 12   hydrALAZINE (APRESOLINE) 50 MG tablet Take 1 tablet (50 mg total) by mouth 3 (three) times daily. 90 tablet 5   ibuprofen (ADVIL) 800 MG tablet Take 1 tablet (800 mg total) by mouth every 8 (eight) hours as needed. 30 tablet 0   Lidocaine-Hydrocortisone Ace 3-2.5 % KIT Use as directed every 12 hrs as needed 1 each 0   metoprolol succinate (TOPROL XL) 25 MG 24 hr tablet Take 1 tablet (25 mg total) by mouth daily. 90 tablet 3   mometasone (ELOCON) 0.1 % ointment Apply topically daily.  45 g 0   Olopatadine HCl (PAZEO) 0.7 % SOLN Place 1 drop into both eyes daily. 2.5 mL 5   pimecrolimus (ELIDEL) 1 % cream Apply topically 2 (two) times daily. 60 g 4   potassium chloride (KLOR-CON) 10 MEQ tablet Take 2 tablets (20 mEq total) by mouth daily. 180 tablet 3   predniSONE (DELTASONE) 10 MG tablet 3 tabs by mouth per day for 3 days,2tabs per day for 3 days,1tab per day for 3 days 18 tablet 0   spironolactone (ALDACTONE) 50 MG tablet Take 50 mg by mouth daily.     traMADol (ULTRAM) 50 MG tablet Take 1 tablet (50 mg total) by mouth every 6 (six) hours as needed. 30 tablet 0   triamcinolone (NASACORT AQ) 55 MCG/ACT AERO nasal inhaler Place 2 sprays into the nose daily. 1 Inhaler 12   triamcinolone cream (KENALOG) 0.1 % Apply 1 application topically 2 (two) times daily. 45 g 2   No current facility-administered medications on file prior to visit.        ROS:  All others reviewed and negative.  Objective        PE:  BP 138/84 (BP Location: Left Arm, Patient Position: Sitting, Cuff Size: Normal)   Pulse 86   Temp 99.1 F (37.3 C) (Oral)   Ht _0  (1.626 m)   Wt 151 lb 6.4 oz (68.7 kg)   SpO2 99%   BMI 25.99 kg/m                 Constitutional: Pt appears in NAD               HENT: Head: NCAT.                Right Ear: External ear normal.                 Left Ear: External ear normal.                Eyes: . Pupils are equal, round, and reactive to light. Conjunctivae and EOM are normal               Nose: without d/c or deformity               Neck: Neck supple. Gross normal ROM               Cardiovascular: Normal rate and regular rhythm.                 Pulmonary/Chest: Effort normal and breath sounds without rales or wheezing.                Abd:  Soft, NT, ND, + BS, no organomegaly  Neurological: Pt is alert. At baseline orientation, motor grossly intact                Spine nontender in midline, has severe left > right msk lumbar paravertebral spasm                Skin: Skin is warm. No rashes, no other new lesions, LE edema - none               Psychiatric: Pt behavior is normal without agitation   Micro: none  Cardiac tracings I have personally interpreted today:  none  Pertinent Radiological findings (summarize): none   Lab Results  Component Value Date   WBC 9.4 03/17/2020   HGB 12.2 03/17/2020   HCT 36.0 03/17/2020   PLT 285.0 03/17/2020   GLUCOSE 96 03/17/2020   CHOL 192 03/17/2020   TRIG 133.0 03/17/2020   HDL 39.00 (L) 03/17/2020   LDLDIRECT 127.0 07/11/2017   LDLCALC 127 (H) 03/17/2020   ALT 13 03/17/2020   AST 17 03/17/2020   NA 137 03/17/2020   K 3.8 03/17/2020   CL 102 03/17/2020   CREATININE 0.76 03/17/2020   BUN 18 03/17/2020   CO2 30 03/17/2020   TSH 0.71 03/17/2020   Assessment/Plan:  Kristie Nelson is a 40 y.o. Black or African American [2] female with  has a past medical history of ALLERGIC RHINITIS (04/03/2007), Anal fissure, ANXIETY (04/03/2007), DEPRESSION (04/03/2007), Dysrhythmia, ECZEMA (03/19/2009), Heartburn in pregnancy, Hemorrhoids, HYPERLIPIDEMIA (12/09/2009), HYPERTENSION (04/03/2007), MIGRAINE, COMMON (12/09/2009), Postoperative anemia due to acute blood loss (08/30/2012), and Shortness of breath.  Low back pain This is c/w msk strain/spasm - severe currently , for pain control and flexeril prn,  to f/u any worsening symptoms or concerns  Essential hypertension BP Readings from Last 3 Encounters:  06/30/20 138/84  03/17/20 122/70  06/27/19 118/80   Stable, pt to continue medical treatment norvasc, hydralazine   Chronic constipation Currently stable, cont same tx,  to f/u any worsening symptoms or concerns   Followup: Return if symptoms worsen or fail to improve.  Cathlean Cower, MD 07/04/2020 8:32 PM [facility]

## 2020-06-30 NOTE — Patient Instructions (Signed)
Please take all new medication as prescribed - the pain medication and muscle relaxer as needed  Please continue all other medications as before, and refills have been done if requested.  Please have the pharmacy call with any other refills you may need.  Please keep your appointments with your specialists as you may have planned   

## 2020-07-04 ENCOUNTER — Encounter: Payer: Self-pay | Admitting: Internal Medicine

## 2020-07-04 NOTE — Assessment & Plan Note (Signed)
BP Readings from Last 3 Encounters:  06/30/20 138/84  03/17/20 122/70  06/27/19 118/80   Stable, pt to continue medical treatment norvasc, hydralazine

## 2020-07-04 NOTE — Assessment & Plan Note (Signed)
Currently stable, cont same tx,  to f/u any worsening symptoms or concerns

## 2020-07-04 NOTE — Assessment & Plan Note (Signed)
This is c/w msk strain/spasm - severe currently , for pain control and flexeril prn,  to f/u any worsening symptoms or concerns

## 2020-07-10 ENCOUNTER — Other Ambulatory Visit: Payer: Self-pay

## 2020-07-10 ENCOUNTER — Encounter: Payer: Self-pay | Admitting: Internal Medicine

## 2020-07-10 ENCOUNTER — Ambulatory Visit: Payer: BC Managed Care – PPO | Admitting: Internal Medicine

## 2020-07-10 VITALS — BP 120/78 | HR 96 | Temp 99.7°F | Ht 64.0 in | Wt 150.0 lb

## 2020-07-10 DIAGNOSIS — F411 Generalized anxiety disorder: Secondary | ICD-10-CM | POA: Diagnosis not present

## 2020-07-10 DIAGNOSIS — I10 Essential (primary) hypertension: Secondary | ICD-10-CM

## 2020-07-10 DIAGNOSIS — E7800 Pure hypercholesterolemia, unspecified: Secondary | ICD-10-CM | POA: Diagnosis not present

## 2020-07-10 DIAGNOSIS — F32A Depression, unspecified: Secondary | ICD-10-CM | POA: Diagnosis not present

## 2020-07-10 MED ORDER — AMLODIPINE BESYLATE 2.5 MG PO TABS: 2.5000 mg | ORAL_TABLET | Freq: Every day | ORAL | 3 refills | Status: DC

## 2020-07-10 MED ORDER — HYDRALAZINE HCL 25 MG PO TABS: ORAL_TABLET | ORAL | 11 refills | Status: DC

## 2020-07-10 NOTE — Patient Instructions (Signed)
Ok to decrease the amlodipine to 2.5 mg per day  Please take all new medication as prescribed  - the hydralazine 25 mg only for SBP (the top BP number) more then 160 (up to three times per day)  Please continue all other medications as before, and refills have been done if requested.  Please have the pharmacy call with any other refills you may need.  Please continue your efforts at being more active, low cholesterol diet, and weight control.  Please keep your appointments with your specialists as you may have planned

## 2020-07-10 NOTE — Progress Notes (Signed)
Patient ID: Kristie Nelson, female   DOB: 1980/06/15, 40 y.o.   MRN: [id number]        Chief Complaint: follow up HTN, HLD, anxiety       HPI:  Kristie Nelson is a 40 y.o. female here with c/o BP too low at times with sbp at 100 with sluggishness and fatigue, Pt denies chest pain, increased sob or doe, wheezing, orthopnea, PND, increased LE swelling, palpitations, dizziness or syncope.   Pt denies polydipsia, polyuria, or new focal neuro s/s.   Pt denies fever, wt loss, night sweats, loss of appetite, or other constitutional symptoms  Trying to follow a lower chol diet.  Denies worsening depressive symptoms, suicidal ideation, or panic; has ongoing anxiety, overall stable and declines change in tx today       Wt Readings from Last 3 Encounters:  07/10/20 150 lb (68 kg)  06/30/20 151 lb 6.4 oz (68.7 kg)  03/17/20 153 lb (69.4 kg)   BP Readings from Last 3 Encounters:  07/10/20 120/78  06/30/20 138/84  03/17/20 122/70         Past Medical History:  Diagnosis Date   ALLERGIC RHINITIS 04/03/2007   Anal fissure    ANXIETY 04/03/2007   DEPRESSION 04/03/2007   Dysrhythmia    palpitations due to stress   ECZEMA 03/19/2009   Heartburn in pregnancy    Hemorrhoids    HYPERLIPIDEMIA 12/09/2009   HYPERTENSION 04/03/2007   MIGRAINE, COMMON 12/09/2009   Postoperative anemia due to acute blood loss 08/30/2012   Shortness of breath    with anxiety   Past Surgical History:  Procedure Laterality Date   CESAREAN SECTION  2010   CESAREAN SECTION N/A 08/29/2012   Procedure: Repeat CESAREAN SECTION;  Surgeon: Lovenia Kim, MD;  Location: [facility];  Service: Obstetrics;  Laterality: N/A;  EDD: 09/17/12;REQUEST DEE;[name]    reports that she has never smoked. She has never used smokeless tobacco. She reports that she does not drink alcohol and does not use drugs. family history includes Diabetes in her brother, father, maternal grandmother, and mother; Heart disease in her mother; Hyperlipidemia in her  maternal grandmother and mother; Hypertension in her brother, father, maternal grandfather, maternal grandmother, and mother; Liver cancer in her maternal grandfather; Thyroid disease in her maternal grandmother. Allergies  Allergen Reactions   Dog Epithelium Itching   Latex Itching    swelling   Peanut-Containing Drug Products     REACTION: throat swelling   Penicillins     REACTION: itching   Tomato (Diagnostic) Itching and Swelling   Current Outpatient Medications on File Prior to Visit  Medication Sig Dispense Refill   AMBULATORY NON FORMULARY MEDICATION Diltiazem 2% mixed with Lidocaine 5%, ratio 1:1  Apply to rectum 2-3 times a day 30 g 2   azelastine (OPTIVAR) 0.05 % ophthalmic solution Place 1 drop into both eyes 2 (two) times daily. 6 mL 12   cetirizine (ZYRTEC) 10 MG tablet Take 1 tablet (10 mg total) by mouth daily. (Patient taking differently: Take 10 mg by mouth as needed.) 30 tablet 11   COVID-19 Specimen Collection KIT See admin instructions. for testing     cyclobenzaprine (FLEXERIL) 5 MG tablet Take 1 tablet (5 mg total) by mouth 3 (three) times daily as needed. for muscle spams 60 tablet 2   diltiazem 2 % GEL Apply 1 application topically 3 (three) times daily. 30 g 3   Epinastine HCl 0.05 % ophthalmic solution Place 1 drop into  both eyes 2 (two) times daily. 10 mL 12   HYDROcodone-acetaminophen (NORCO/VICODIN) 5-325 MG tablet Take 1 tablet by mouth every 6 (six) hours as needed for moderate pain. 30 tablet 0   ibuprofen (ADVIL) 800 MG tablet Take 1 tablet (800 mg total) by mouth every 8 (eight) hours as needed. 30 tablet 0   Lidocaine-Hydrocortisone Ace 3-2.5 % KIT Use as directed every 12 hrs as needed 1 each 0   metoprolol succinate (TOPROL XL) 25 MG 24 hr tablet Take 1 tablet (25 mg total) by mouth daily. 90 tablet 3   mometasone (ELOCON) 0.1 % ointment Apply topically daily. 45 g 0   Olopatadine HCl (PAZEO) 0.7 % SOLN Place 1 drop into both eyes daily. 2.5 mL 5    pimecrolimus (ELIDEL) 1 % cream Apply topically 2 (two) times daily. 60 g 4   potassium chloride (KLOR-CON) 10 MEQ tablet Take 2 tablets (20 mEq total) by mouth daily. 180 tablet 3   predniSONE (DELTASONE) 10 MG tablet 3 tabs by mouth per day for 3 days,2tabs per day for 3 days,1tab per day for 3 days 18 tablet 0   spironolactone (ALDACTONE) 50 MG tablet Take 50 mg by mouth daily.     traMADol (ULTRAM) 50 MG tablet Take 1 tablet (50 mg total) by mouth every 6 (six) hours as needed. 30 tablet 0   triamcinolone (NASACORT AQ) 55 MCG/ACT AERO nasal inhaler Place 2 sprays into the nose daily. 1 Inhaler 12   triamcinolone cream (KENALOG) 0.1 % Apply 1 application topically 2 (two) times daily. 45 g 2   No current facility-administered medications on file prior to visit.        ROS:  All others reviewed and negative.  Objective        PE:  BP 120/78 (BP Location: Right Arm, Patient Position: Sitting, Cuff Size: Normal)   Pulse 96   Temp 99.7 F (37.6 C) (Oral)   Ht '5\' 4"'  (1.626 m)   Wt 150 lb (68 kg)   SpO2 97%   BMI 25.75 kg/m                 Constitutional: Pt appears in NAD               HENT: Head: NCAT.                Right Ear: External ear normal.                 Left Ear: External ear normal.                Eyes: . Pupils are equal, round, and reactive to light. Conjunctivae and EOM are normal               Nose: without d/c or deformity               Neck: Neck supple. Gross normal ROM               Cardiovascular: Normal rate and regular rhythm.                 Pulmonary/Chest: Effort normal and breath sounds without rales or wheezing.                Abd:  Soft, NT, ND, + BS, no organomegaly               Neurological: Pt is alert. At baseline orientation, motor grossly intact  Skin: Skin is warm. No rashes, no other new lesions, LE edema - none               Psychiatric: Pt behavior is normal without agitation   Micro: none  Cardiac tracings I have personally  interpreted today:  none  Pertinent Radiological findings (summarize): none   Lab Results  Component Value Date   WBC 9.4 03/17/2020   HGB 12.2 03/17/2020   HCT 36.0 03/17/2020   PLT 285.0 03/17/2020   GLUCOSE 96 03/17/2020   CHOL 192 03/17/2020   TRIG 133.0 03/17/2020   HDL 39.00 (L) 03/17/2020   LDLDIRECT 127.0 07/11/2017   LDLCALC 127 (H) 03/17/2020   ALT 13 03/17/2020   AST 17 03/17/2020   NA 137 03/17/2020   K 3.8 03/17/2020   CL 102 03/17/2020   CREATININE 0.76 03/17/2020   BUN 18 03/17/2020   CO2 30 03/17/2020   TSH 0.71 03/17/2020   Assessment/Plan:  REGENE MCCARTHY is a 40 y.o. Black or African American [2] female with  has a past medical history of ALLERGIC RHINITIS (04/03/2007), Anal fissure, ANXIETY (04/03/2007), DEPRESSION (04/03/2007), Dysrhythmia, ECZEMA (03/19/2009), Heartburn in pregnancy, Hemorrhoids, HYPERLIPIDEMIA (12/09/2009), HYPERTENSION (04/03/2007), MIGRAINE, COMMON (12/09/2009), Postoperative anemia due to acute blood loss (08/30/2012), and Shortness of breath.  Essential hypertension overcontrolled by hx, ok for decreased amlodipine to 2.5 mg per day, but also hydralazine 25 mg tid prn sbp > 160 as BP has been somewhat labile in the past  Hyperlipidemia Lab Results  Component Value Date   LDLCALC 127 (H) 03/17/2020   Uncontrolled, goal ld < 100,  pt to continue low chol diet, declines statin   Depression Overall chronic persistent, declines med change or other referral at this itme  Anxiety state Overall chronic persistent, declines med change or other referral at this itme  Followup: Return if symptoms worsen or fail to improve.  Cathlean Cower, MD 07/14/2020 7:26 PM [facility]

## 2020-07-14 ENCOUNTER — Encounter: Payer: Self-pay | Admitting: Internal Medicine

## 2020-07-14 NOTE — Assessment & Plan Note (Signed)
Overall chronic persistent, declines med change or other referral at this itme 

## 2020-07-14 NOTE — Assessment & Plan Note (Signed)
Lab Results  Component Value Date   LDLCALC 127 (H) 03/17/2020   Uncontrolled, goal ld < 100,  pt to continue low chol diet, declines statin

## 2020-07-14 NOTE — Assessment & Plan Note (Signed)
overcontrolled by hx, ok for decreased amlodipine to 2.5 mg per day, but also hydralazine 25 mg tid prn sbp > 160 as BP has been somewhat labile in the past

## 2020-07-16 ENCOUNTER — Encounter: Payer: Self-pay | Admitting: Internal Medicine

## 2020-08-06 ENCOUNTER — Ambulatory Visit: Payer: BC Managed Care – PPO | Admitting: Internal Medicine

## 2020-08-06 ENCOUNTER — Other Ambulatory Visit: Payer: Self-pay

## 2020-08-06 ENCOUNTER — Encounter: Payer: Self-pay | Admitting: Internal Medicine

## 2020-08-06 ENCOUNTER — Ambulatory Visit (INDEPENDENT_AMBULATORY_CARE_PROVIDER_SITE_OTHER): Payer: BC Managed Care – PPO

## 2020-08-06 VITALS — BP 120/78 | HR 97 | Temp 98.0°F | Ht 64.0 in | Wt 151.6 lb

## 2020-08-06 DIAGNOSIS — R079 Chest pain, unspecified: Secondary | ICD-10-CM | POA: Diagnosis not present

## 2020-08-06 DIAGNOSIS — M549 Dorsalgia, unspecified: Secondary | ICD-10-CM

## 2020-08-06 DIAGNOSIS — I10 Essential (primary) hypertension: Secondary | ICD-10-CM

## 2020-08-06 DIAGNOSIS — S39012D Strain of muscle, fascia and tendon of lower back, subsequent encounter: Secondary | ICD-10-CM

## 2020-08-06 NOTE — Progress Notes (Signed)
Patient ID: Kristie Nelson, female   DOB: [birth year], 40 y.o.   MRN: [id number]        Chief Complaint: follow up chest pain and bilateral upper back pain       HPI:  Kristie Nelson is a 40 y.o. 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   Pt denies fever, wt loss, night sweats, loss of appetite, or other constitutional symptoms         Wt Readings from Last 3 Encounters:  08/06/20 151 lb 9.6 oz (68.8 kg)  07/10/20 150 lb (68 kg)  06/30/20 151 lb 6.4 oz (68.7 kg)   BP Readings from Last 3 Encounters:  08/06/20 120/78  07/10/20 120/78  06/30/20 138/84         Past Medical History:  Diagnosis Date   ALLERGIC RHINITIS 04/03/2007   Anal fissure    ANXIETY 04/03/2007   DEPRESSION 04/03/2007   Dysrhythmia    palpitations due to stress   ECZEMA 03/19/2009   Heartburn in pregnancy    Hemorrhoids    HYPERLIPIDEMIA 12/09/2009   HYPERTENSION 04/03/2007   MIGRAINE, COMMON 12/09/2009   Postoperative anemia due to acute blood loss 08/30/2012   Shortness of breath    with anxiety   Past Surgical History:  Procedure Laterality Date   CESAREAN SECTION  2010   CESAREAN SECTION N/A 08/29/2012   Procedure: Repeat CESAREAN SECTION;  Surgeon: Lovenia Kim, MD;  Location: [facility];  Service: Obstetrics;  Laterality: N/A;  EDD: 09/17/12;REQUEST DEE;[name]    reports that she has never smoked. She has never used smokeless tobacco. She reports that she does not drink alcohol and does not use drugs. family history includes Diabetes in her brother, father, maternal grandmother, and mother; Heart disease in her mother; Hyperlipidemia in her maternal  grandmother and mother; Hypertension in her brother, father, maternal grandfather, maternal grandmother, and mother; Liver cancer in her maternal grandfather; Thyroid disease in her maternal grandmother. Allergies  Allergen Reactions   Dog Epithelium Itching   Latex Itching    swelling   Peanut-Containing Drug Products     REACTION: throat swelling   Penicillins     REACTION: itching   Tomato (Diagnostic) Itching and Swelling   Current Outpatient Medications on File Prior to Visit  Medication Sig Dispense Refill   AMBULATORY NON FORMULARY MEDICATION Diltiazem 2% mixed with Lidocaine 5%, ratio 1:1  Apply to rectum 2-3 times a day 30 g 2   amLODipine (NORVASC) 2.5 MG tablet Take 1 tablet (2.5 mg total) by mouth daily. 90 tablet 3   azelastine (OPTIVAR) 0.05 % ophthalmic solution Place 1 drop into both eyes 2 (two) times daily. 6 mL 12   cetirizine (ZYRTEC) 10 MG tablet Take 1 tablet (10 mg total) by mouth daily. (Patient taking differently: Take 10 mg by mouth as needed.) 30 tablet 11   COVID-19 Specimen Collection KIT See admin instructions. for testing     cyclobenzaprine (FLEXERIL) 5 MG tablet Take 1 tablet (5 mg total) by mouth 3 (three) times daily as needed. for muscle spams 60 tablet 2   diltiazem 2 % GEL Apply 1 application topically  3 (three) times daily. 30 g 3   Epinastine HCl 0.05 % ophthalmic solution Place 1 drop into both eyes 2 (two) times daily. 10 mL 12   hydrALAZINE (APRESOLINE) 25 MG tablet 1 tab by mouth three times daily as needed for SBP > 160 90 tablet 11   HYDROcodone-acetaminophen (NORCO/VICODIN) 5-325 MG tablet Take 1 tablet by mouth every 6 (six) hours as needed for moderate pain. 30 tablet 0   ibuprofen (ADVIL) 800 MG tablet Take 1 tablet (800 mg total) by mouth every 8 (eight) hours as needed. 30 tablet 0   Lidocaine-Hydrocortisone Ace 3-2.5 % KIT Use as directed every 12 hrs as needed 1 each 0   metoprolol succinate (TOPROL XL) 25 MG 24 hr tablet Take 1 tablet  (25 mg total) by mouth daily. 90 tablet 3   mometasone (ELOCON) 0.1 % ointment Apply topically daily. 45 g 0   Olopatadine HCl (PAZEO) 0.7 % SOLN Place 1 drop into both eyes daily. 2.5 mL 5   pimecrolimus (ELIDEL) 1 % cream Apply topically 2 (two) times daily. 60 g 4   potassium chloride (KLOR-CON) 10 MEQ tablet Take 2 tablets (20 mEq total) by mouth daily. 180 tablet 3   predniSONE (DELTASONE) 10 MG tablet 3 tabs by mouth per day for 3 days,2tabs per day for 3 days,1tab per day for 3 days 18 tablet 0   spironolactone (ALDACTONE) 50 MG tablet Take 50 mg by mouth daily.     traMADol (ULTRAM) 50 MG tablet Take 1 tablet (50 mg total) by mouth every 6 (six) hours as needed. 30 tablet 0   triamcinolone (NASACORT AQ) 55 MCG/ACT AERO nasal inhaler Place 2 sprays into the nose daily. 1 Inhaler 12   triamcinolone cream (KENALOG) 0.1 % Apply 1 application topically 2 (two) times daily. 45 g 2   No current facility-administered medications on file prior to visit.        ROS:  All others reviewed and negative.  Objective        PE:  BP 120/78 (BP Location: Left Arm, Patient Position: Sitting, Cuff Size: Normal)   Pulse 97   Temp 98 F (36.7 C) (Oral)   Ht _0  (1.626 m)   Wt 151 lb 9.6 oz (68.8 kg)   LMP 07/29/2020   SpO2 98%   BMI 26.02 kg/m                 Constitutional: Pt appears in NAD               HENT: Head: NCAT.                Right Ear: External ear normal.                 Left Ear: External ear normal.                Eyes: . Pupils are equal, round, and reactive to light. Conjunctivae and EOM are normal               Nose: without d/c or deformity               Neck: Neck supple. Gross normal ROM               Cardiovascular: Normal rate and regular rhythm.                 Pulmonary/Chest: Effort normal and breath sounds without rales or wheezing.  Bilat upper back with muscular tension and tenderness between shoulder blades               Neurological: Pt is  alert. At baseline orientation, motor grossly intact               Skin: Skin is warm. No rashes, no other new lesions, LE edema - none               Psychiatric: Pt behavior is normal without agitation   Micro: none  Cardiac tracings I have personally interpreted today:  none  Pertinent Radiological findings (summarize): none   Lab Results  Component Value Date   WBC 9.4 03/17/2020   HGB 12.2 03/17/2020   HCT 36.0 03/17/2020   PLT 285.0 03/17/2020   GLUCOSE 96 03/17/2020   CHOL 192 03/17/2020   TRIG 133.0 03/17/2020   HDL 39.00 (L) 03/17/2020   LDLDIRECT 127.0 07/11/2017   LDLCALC 127 (H) 03/17/2020   ALT 13 03/17/2020   AST 17 03/17/2020   NA 137 03/17/2020   K 3.8 03/17/2020   CL 102 03/17/2020   CREATININE 0.76 03/17/2020   BUN 18 03/17/2020   CO2 30 03/17/2020   TSH 0.71 03/17/2020   Assessment/Plan:  Kristie Nelson is a 40 y.o. Black or African American [2] female with  has a past medical history of ALLERGIC RHINITIS (04/03/2007), Anal fissure, ANXIETY (04/03/2007), DEPRESSION (04/03/2007), Dysrhythmia, ECZEMA (03/19/2009), Heartburn in pregnancy, Hemorrhoids, HYPERLIPIDEMIA (12/09/2009), HYPERTENSION (04/03/2007), MIGRAINE, COMMON (12/09/2009), Postoperative anemia due to acute blood loss (08/30/2012), and Shortness of breath.  Essential hypertension BP Readings from Last 3 Encounters:  08/06/20 120/78  07/10/20 120/78  06/30/20 138/84   Stable, pt to continue medical treatment norvasc, toprol, aldactone   Low back strain Improved after last visit,  to f/u any worsening symptoms or concerns  Acute upper back pain C/w msk strain with typing, for muscle relaxer prn,  to f/u any worsening symptoms or concerns  Chest pain Etiology unclear, very low suspicion for cardiac, for cxr,  to f/u any worsening symptoms or concerns  Followup: Return if symptoms worsen or fail to improve.  Cathlean Cower, MD 08/08/2020 9:14 PM [facility]

## 2020-08-06 NOTE — Patient Instructions (Signed)
Please continue all other medications as before, and refills have been done if requested.  Please have the pharmacy call with any other refills you may need.  Please continue your efforts at being more active, low cholesterol diet, and weight control.  Please keep your appointments with your specialists as you may have planned  Please go to the XRAY Department in the first floor for the x-ray testing  You are given the Note today  You will be contacted by phone if any changes need to be made immediately.  Otherwise, you will receive a letter about your results with an explanation, but please check with MyChart first.  Please remember to sign up for MyChart if you have not done so, as this will be important to you in the future with finding out test results, communicating by private email, and scheduling acute appointments online when needed.

## 2020-08-08 ENCOUNTER — Encounter: Payer: Self-pay | Admitting: Internal Medicine

## 2020-08-08 DIAGNOSIS — R079 Chest pain, unspecified: Secondary | ICD-10-CM | POA: Insufficient documentation

## 2020-08-08 NOTE — Assessment & Plan Note (Signed)
Etiology unclear, very low suspicion for cardiac, for cxr,  to f/u any worsening symptoms or concerns

## 2020-08-08 NOTE — Assessment & Plan Note (Signed)
Improved after last visit,  to f/u any worsening symptoms or concerns

## 2020-08-08 NOTE — Assessment & Plan Note (Signed)
C/w msk strain with typing, for muscle relaxer prn,  to f/u any worsening symptoms or concerns

## 2020-08-08 NOTE — Assessment & Plan Note (Signed)
BP Readings from Last 3 Encounters:  08/06/20 120/78  07/10/20 120/78  06/30/20 138/84   Stable, pt to continue medical treatment norvasc, toprol, aldactone

## 2020-08-09 ENCOUNTER — Encounter: Payer: Self-pay | Admitting: Internal Medicine

## 2020-09-14 ENCOUNTER — Ambulatory Visit: Payer: BC Managed Care – PPO | Admitting: Internal Medicine

## 2020-09-14 ENCOUNTER — Encounter: Payer: Self-pay | Admitting: Internal Medicine

## 2020-09-14 ENCOUNTER — Other Ambulatory Visit: Payer: Self-pay

## 2020-09-14 VITALS — BP 146/78 | HR 97 | Temp 99.0°F | Ht 64.0 in | Wt 157.0 lb

## 2020-09-14 DIAGNOSIS — I10 Essential (primary) hypertension: Secondary | ICD-10-CM

## 2020-09-14 DIAGNOSIS — H9192 Unspecified hearing loss, left ear: Secondary | ICD-10-CM | POA: Diagnosis not present

## 2020-09-14 DIAGNOSIS — J3089 Other allergic rhinitis: Secondary | ICD-10-CM | POA: Diagnosis not present

## 2020-09-14 DIAGNOSIS — G44091 Other trigeminal autonomic cephalgias (TAC), intractable: Secondary | ICD-10-CM | POA: Diagnosis not present

## 2020-09-14 MED ORDER — HYDROCODONE-ACETAMINOPHEN 5-325 MG PO TABS: 1.0000 | ORAL_TABLET | Freq: Four times a day (QID) | ORAL | 0 refills | Status: DC | PRN

## 2020-09-14 MED ORDER — CARBAMAZEPINE ER 100 MG PO TB12
100.0000 mg | ORAL_TABLET | Freq: Two times a day (BID) | ORAL | 5 refills | Status: DC
Start: 2020-09-14 — End: 2021-03-17

## 2020-09-14 NOTE — Patient Instructions (Signed)
Your left ear was irrigated today  Please take all new medication as prescribed - the tegretol 100 mg twice per day for nerve pain  Please continue all other medications as before, including the hydrocodone refill if needed  Please have the pharmacy call with any other refills you may need.  Please keep your appointments with your specialists as you may have planned  You will be contacted regarding the referral for: MRI for the head  Please call by Friday or early next wk to let us know if the tegretol is helping; the dose can be increased to 200 mg twice per day if needed  If this is working, you may want to have a Neurosurgury referral  If the medication is not working, we still need the MRI, but you may also need Neurology or ENT

## 2020-09-14 NOTE — Progress Notes (Signed)
Patient ID: Kristie Nelson, female   DOB: [birth year], 40 y.o.   MRN: [id number]        Chief Complaint: follow up right facial pain, left ear discomfort       HPI:  Kristie Nelson is a 40 y.o. female here with c/o 1 mo onset right facial pain, sharp and lancinating maybe to the upper, mid and lower facies to the neck, severe, near constant, not really worse with eating or chewing, also with recent sinus congestion and left ear fullness and mild discomfort without fever , chills and not better overall with recent antibx course.  Right side pain is worse behind the right eye but no other HA, vision change, and not really icepick like.  Pt denies chest pain, increased sob or doe, wheezing, orthopnea, PND, increased LE swelling, palpitations, dizziness or syncope.   Pt denies polydipsia, polyuria, or new neuro s/s.  BP higher today but has been < 140/90 at home recently.        Wt Readings from Last 3 Encounters:  09/15/20 157 lb (71.2 kg)  09/14/20 157 lb (71.2 kg)  08/06/20 151 lb 9.6 oz (68.8 kg)   BP Readings from Last 3 Encounters:  09/16/20 125/78  09/14/20 (!) 146/78  08/06/20 120/78         Past Medical History:  Diagnosis Date   ALLERGIC RHINITIS 04/03/2007   Anal fissure    ANXIETY 04/03/2007   DEPRESSION 04/03/2007   Dysrhythmia    palpitations due to stress   ECZEMA 03/19/2009   Heartburn in pregnancy    Hemorrhoids    HYPERLIPIDEMIA 12/09/2009   HYPERTENSION 04/03/2007   MIGRAINE, COMMON 12/09/2009   Postoperative anemia due to acute blood loss 08/30/2012   Shortness of breath    with anxiety   Past Surgical History:  Procedure Laterality Date   CESAREAN SECTION  2010   CESAREAN SECTION N/A 08/29/2012   Procedure: Repeat CESAREAN SECTION;  Surgeon: Lovenia Kim, MD;  Location: [facility];  Service: Obstetrics;  Laterality: N/A;  EDD: 09/17/12;REQUEST DEE;[name]    reports that she has never smoked. She has never used smokeless tobacco. She reports that she does not drink  alcohol and does not use drugs. family history includes Diabetes in her brother, father, maternal grandmother, and mother; Heart disease in her mother; Hyperlipidemia in her maternal grandmother and mother; Hypertension in her brother, father, maternal grandfather, maternal grandmother, and mother; Liver cancer in her maternal grandfather; Thyroid disease in her maternal grandmother. Allergies  Allergen Reactions   Dog Epithelium Itching   Latex Itching    swelling   Peanut-Containing Drug Products     REACTION: throat swelling   Penicillins     REACTION: itching   Tomato (Diagnostic) Itching and Swelling   Current Outpatient Medications on File Prior to Visit  Medication Sig Dispense Refill   AMBULATORY NON FORMULARY MEDICATION Diltiazem 2% mixed with Lidocaine 5%, ratio 1:1  Apply to rectum 2-3 times a day 30 g 2   amLODipine (NORVASC) 2.5 MG tablet Take 1 tablet (2.5 mg total) by mouth daily. 90 tablet 3   azelastine (OPTIVAR) 0.05 % ophthalmic solution Place 1 drop into both eyes 2 (two) times daily. 6 mL 12   cetirizine (ZYRTEC) 10 MG tablet Take 1 tablet (10 mg total) by mouth daily. (Patient taking differently: Take 10 mg by mouth as needed.) 30 tablet 11   cyclobenzaprine (FLEXERIL) 5 MG tablet Take 1 tablet (5 mg total) by mouth  3 (three) times daily as needed. for muscle spams 60 tablet 2   diltiazem 2 % GEL Apply 1 application topically 3 (three) times daily. 30 g 3   Epinastine HCl 0.05 % ophthalmic solution Place 1 drop into both eyes 2 (two) times daily. 10 mL 12   hydrALAZINE (APRESOLINE) 25 MG tablet 1 tab by mouth three times daily as needed for SBP > 160 90 tablet 11   Lidocaine-Hydrocortisone Ace 3-2.5 % KIT Use as directed every 12 hrs as needed 1 each 0   metoprolol succinate (TOPROL XL) 25 MG 24 hr tablet Take 1 tablet (25 mg total) by mouth daily. 90 tablet 3   mometasone (ELOCON) 0.1 % ointment Apply topically daily. 45 g 0   Olopatadine HCl (PAZEO) 0.7 % SOLN  Place 1 drop into both eyes daily. 2.5 mL 5   pimecrolimus (ELIDEL) 1 % cream Apply topically 2 (two) times daily. 60 g 4   potassium chloride (KLOR-CON) 10 MEQ tablet Take 2 tablets (20 mEq total) by mouth daily. 180 tablet 3   spironolactone (ALDACTONE) 50 MG tablet Take 50 mg by mouth daily.     traMADol (ULTRAM) 50 MG tablet Take 1 tablet (50 mg total) by mouth every 6 (six) hours as needed. 30 tablet 0   triamcinolone (NASACORT AQ) 55 MCG/ACT AERO nasal inhaler Place 2 sprays into the nose daily. 1 Inhaler 12   triamcinolone cream (KENALOG) 0.1 % Apply 1 application topically 2 (two) times daily. 45 g 2   No current facility-administered medications on file prior to visit.        ROS:  All others reviewed and negative.  Objective        PE:  BP (!) 146/78 (BP Location: Right Arm, Patient Position: Sitting, Cuff Size: Normal)   Pulse 97   Temp 99 F (37.2 C) (Oral)   Ht '5\' 4"'  (1.626 m)   Wt 157 lb (71.2 kg)   SpO2 98%   BMI 26.95 kg/m                 Constitutional: Pt appears in NAD               HENT: Head: NCAT.                Right Ear: External ear normal.                 Left Ear: External ear normal. Left TM mild erythema after hearing improved with wax irrigation               Eyes: . Pupils are equal, round, and reactive to light. Conjunctivae and EOM are normal               Nose: without d/c or deformity               Neck: Neck supple. Gross normal ROM               Cardiovascular: Normal rate and regular rhythm.                 Pulmonary/Chest: Effort normal and breath sounds without rales or wheezing.                Abd:  Soft, NT, ND, + BS, no organomegaly               Neurological: Pt is alert. At baseline orientation, motor grossly intact, cn 2-2 intact  Skin: Skin is warm. No rashes, no other new lesions, LE edema - none               Psychiatric: Pt behavior is normal without agitation   Micro: none  Cardiac tracings I have personally  interpreted today:  none  Pertinent Radiological findings (summarize): none   Lab Results  Component Value Date   WBC 7.6 09/15/2020   HGB 13.0 09/15/2020   HCT 39.2 09/15/2020   PLT 325 09/15/2020   GLUCOSE 94 09/15/2020   CHOL 192 03/17/2020   TRIG 133.0 03/17/2020   HDL 39.00 (L) 03/17/2020   LDLDIRECT 127.0 07/11/2017   LDLCALC 127 (H) 03/17/2020   ALT 13 09/15/2020   AST 18 09/15/2020   NA 136 09/15/2020   K 3.5 09/15/2020   CL 104 09/15/2020   CREATININE 0.80 09/15/2020   BUN 17 09/15/2020   CO2 24 09/15/2020   TSH 0.71 03/17/2020   Assessment/Plan:  LORRENA GORANSON is a 40 y.o. Black or African American [2] female with  has a past medical history of ALLERGIC RHINITIS (04/03/2007), Anal fissure, ANXIETY (04/03/2007), DEPRESSION (04/03/2007), Dysrhythmia, ECZEMA (03/19/2009), Heartburn in pregnancy, Hemorrhoids, HYPERLIPIDEMIA (12/09/2009), HYPERTENSION (04/03/2007), MIGRAINE, COMMON (12/09/2009), Postoperative anemia due to acute blood loss (08/30/2012), and Shortness of breath.  Cephalalgia I suspect possible right trigeminal neuralgia - for trial tegretol, MRI and pain control hydrocodone, and consider NS vs neurology referral depending on tegretol trial results  Essential hypertension BP Readings from Last 3 Encounters:  09/16/20 125/78  09/14/20 (!) 146/78  08/06/20 120/78   Mild uncontrolled at today visit, pt to continue medical treatment norvas, hydralazine, toprol, aldactone    Allergic rhinitis Also for mucinec bid prn given left ear probable eustachian valve symtpoms,  to f/u any worsening symptoms or concerns   Left ear hearing loss Also left ear wax irrigated clear and hearing improvED  Ceruminosis is noted.  Wax is removed by syringing and manual debridement. Instructions for home care to prevent wax buildup are given.  Followup: Return if symptoms worsen or fail to improve.  Cathlean Cower, MD 09/19/2020 9:14 PM [facility]

## 2020-09-15 ENCOUNTER — Emergency Department (HOSPITAL_COMMUNITY): Payer: BC Managed Care – PPO

## 2020-09-15 ENCOUNTER — Emergency Department (HOSPITAL_COMMUNITY)
Admission: EM | Admit: 2020-09-15 | Discharge: 2020-09-16 | Disposition: A | Discharge status: Home / Self Care | Payer: BC Managed Care – PPO | Attending: Emergency Medicine | Admitting: Emergency Medicine

## 2020-09-15 ENCOUNTER — Other Ambulatory Visit: Payer: Self-pay

## 2020-09-15 ENCOUNTER — Encounter (HOSPITAL_COMMUNITY): Payer: Self-pay

## 2020-09-15 DIAGNOSIS — I10 Essential (primary) hypertension: Secondary | ICD-10-CM | POA: Insufficient documentation

## 2020-09-15 DIAGNOSIS — Z91010 Allergy to peanuts: Secondary | ICD-10-CM | POA: Diagnosis not present

## 2020-09-15 DIAGNOSIS — G8929 Other chronic pain: Secondary | ICD-10-CM | POA: Insufficient documentation

## 2020-09-15 DIAGNOSIS — E041 Nontoxic single thyroid nodule: Secondary | ICD-10-CM | POA: Diagnosis not present

## 2020-09-15 DIAGNOSIS — Z91040 Latex allergy status: Secondary | ICD-10-CM | POA: Insufficient documentation

## 2020-09-15 DIAGNOSIS — R42 Dizziness and giddiness: Secondary | ICD-10-CM

## 2020-09-15 DIAGNOSIS — R519 Headache, unspecified: Secondary | ICD-10-CM | POA: Insufficient documentation

## 2020-09-15 DIAGNOSIS — Z79899 Other long term (current) drug therapy: Secondary | ICD-10-CM | POA: Diagnosis not present

## 2020-09-15 LAB — COMPREHENSIVE METABOLIC PANEL
ALT: 13 U/L (ref 0–44)
AST: 18 U/L (ref 15–41)
Albumin: 4.6 g/dL (ref 3.5–5.0)
Alkaline Phosphatase: 43 U/L (ref 38–126)
Anion gap: 8 (ref 5–15)
BUN: 17 mg/dL (ref 6–20)
CO2: 24 mmol/L (ref 22–32)
Calcium: 9.3 mg/dL (ref 8.9–10.3)
Chloride: 104 mmol/L (ref 98–111)
Creatinine, Ser: 0.8 mg/dL (ref 0.44–1.00)
GFR, Estimated: >60 mL/min (ref >60)
Glucose, Bld: 94 mg/dL (ref 70–99)
Potassium: 3.5 mmol/L (ref 3.5–5.1)
Sodium: 136 mmol/L (ref 135–145)
Total Bilirubin: 0.3 mg/dL (ref 0.3–1.2)
Total Protein: 8.1 g/dL (ref 6.5–8.1)

## 2020-09-15 LAB — URINALYSIS, ROUTINE W REFLEX MICROSCOPIC
Bilirubin Urine: NEGATIVE
Glucose, UA: NEGATIVE mg/dL
Hgb urine dipstick: NEGATIVE
Ketones, ur: NEGATIVE mg/dL
Leukocytes,Ua: NEGATIVE
Nitrite: NEGATIVE
Protein, ur: NEGATIVE mg/dL
Specific Gravity, Urine: <1.005 — ABNORMAL LOW (ref 1.005–1.030)
pH: 7 (ref 5.0–8.0)

## 2020-09-15 LAB — CBC WITH DIFFERENTIAL/PLATELET
Abs Immature Granulocytes: 0.03 10*3/uL (ref 0.00–0.07)
Basophils Absolute: 0.1 10*3/uL (ref 0.0–0.1)
Basophils Relative: 1 %
Eosinophils Absolute: 0.1 10*3/uL (ref 0.0–0.5)
Eosinophils Relative: 1 %
HCT: 39.2 % (ref 36.0–46.0)
Hemoglobin: 13 g/dL (ref 12.0–15.0)
Immature Granulocytes: 0 %
Lymphocytes Relative: 25 %
Lymphs Abs: 1.9 10*3/uL (ref 0.7–4.0)
MCH: 31.2 pg (ref 26.0–34.0)
MCHC: 33.2 g/dL (ref 30.0–36.0)
MCV: 94 fL (ref 80.0–100.0)
Monocytes Absolute: 0.3 10*3/uL (ref 0.1–1.0)
Monocytes Relative: 5 %
Neutro Abs: 5.1 10*3/uL (ref 1.7–7.7)
Neutrophils Relative %: 68 %
Platelets: 325 10*3/uL (ref 150–400)
RBC: 4.17 MIL/uL (ref 3.87–5.11)
RDW: 12.7 % (ref 11.5–15.5)
WBC: 7.6 10*3/uL (ref 4.0–10.5)
nRBC: 0 % (ref 0.0–0.2)

## 2020-09-15 LAB — TROPONIN I (HIGH SENSITIVITY)
Troponin I (High Sensitivity): <2 ng/L (ref <18)
Troponin I (High Sensitivity): 2 ng/L (ref <18)

## 2020-09-15 LAB — I-STAT BETA HCG BLOOD, ED (MC, WL, AP ONLY): I-stat hCG, quantitative: <5 m[IU]/mL (ref <5)

## 2020-09-15 MED ORDER — KETOROLAC TROMETHAMINE 30 MG/ML IJ SOLN
15.0000 mg | Freq: Once | INTRAMUSCULAR | Status: AC
  Administered 2020-09-16: 15 mg via INTRAVENOUS
  Filled 2020-09-15: qty 1

## 2020-09-15 MED ORDER — IOHEXOL 350 MG/ML SOLN
80.0000 mL | Freq: Once | INTRAVENOUS | Status: AC | PRN
  Administered 2020-09-15: 80 mL via INTRAVENOUS

## 2020-09-15 MED ORDER — METOCLOPRAMIDE HCL 5 MG/ML IJ SOLN
10.0000 mg | Freq: Once | INTRAMUSCULAR | Status: AC
  Administered 2020-09-16: 10 mg via INTRAVENOUS
  Filled 2020-09-15: qty 2

## 2020-09-15 NOTE — ED Provider Notes (Signed)
Emergency Medicine Provider Triage Evaluation Note  Kristie Nelson , a 40 y.o. female  was evaluated in triage.  Pt complains of near syncope today with sudden onset of lightheadedness, weakness, near syncope.  History of right-sided headaches last 2 to 3 hours every night for last 3 weeks.  Review of Systems  Positive: Lightheadedness, near syncope Negative: Chest pain, shortness of breath and palpitations, blurry or double vision  Physical Exam  BP 137/76 (BP Location: Left Arm)   Pulse 91   Temp 97.6 F (36.4 C) (Oral)   Resp 18   Ht 5\' 4"  (1.626 m)   Wt 71.2 kg   LMP 09/15/2020   SpO2 100%   BMI 26.95 kg/m  Gen:   Awake, no distress   Resp:  Normal effort  MSK:   Moves extremities without difficulty  Other:  RRR no M/R/G.  Lung CTA B.  Medical Decision Making  Medically screening exam initiated at 6:44 PM.  Appropriate orders placed.  Kristie Nelson was informed that the remainder of the evaluation will be completed by another provider, this initial triage assessment does not replace that evaluation, and the importance of remaining in the ED until their evaluation is complete.  This chart was dictated using voice recognition software, Dragon. Despite the best efforts of this provider to proofread and correct errors, errors may still occur which can change documentation meaning.    Melvenia Beam, PA-C 09/15/20 [id number]    09/17/20, MD 09/15/20 2007

## 2020-09-15 NOTE — ED Triage Notes (Signed)
Per EMS- Patient c/o dizziness and near syncope. Patient reports that she saw her PCP yesterday with c/o headache x 3-3 1/2 weeks and states the pain occurs night on the right side of her head and lasts 2-3 hours nightly.

## 2020-09-16 NOTE — Discharge Instructions (Addendum)
We saw you in the ER for headaches and an episode of dizziness today. All the labs and imaging are normal -besides minor abnormality around the thyroid gland.  We are not sure what is causing your headaches and dizziness, however, there appears to be no evidence of infection, bleeds or tumors based on our exam and results.  Please take motrin every 8-12 hours for the next 2 days. See your primary care doctor for the thyroid evaluation. See your primary care doctor for the persistent headache as well.  If you decide to call neurologist, make sure you see them after the MRI has been completed.  Please return to the ER if the headache gets severe and in not improving, you have associated new one sided numbness, tingling, weakness or confusion, seizures, poor balance or poor vision.

## 2020-09-16 NOTE — ED Provider Notes (Signed)
[facility] DEPT Provider Note   CSN: [id number] Arrival date & time: 09/15/20  1745     History Chief Complaint  Patient presents with   Dizziness    Kristie Nelson is a 40 y.o. female.  HPI     40 year old female comes in with chief complaint of headache and dizziness.  Her primary complaint is dizziness. She reports that while at work today, she had sudden onset of dizziness and she felt like she was going to fall down.  Dizziness is described as lightheadedness and unsteadiness and not spinning sensation.  No history of similar symptoms in the past.  The episode lasted for several minutes and gradually resolved while she was in the ER.  There was no associated headache, neck pain, visual disturbance, focal weakness or numbness.  No history of similar symptoms in the past.  Patient denies any new medications and she denies any substance use.  Additionally, patient reports that for the last at least month she has been having daily overnight headaches.  She wakes up in the middle night because of severe headaches.  She has talked to her PCP about it who had initially thought that patient had sinusitis, but given the progression of her symptoms has now ordered an MRI.  The headaches are frontal.  The headaches are different than her migraine headaches.  The headaches eventually resolved but there is a pressure-like sensation behind her eye that remains and eventually goes away.  Patient denies any history of clotting disorder and there is no family history of brain aneurysm, bleeding.   Past Medical History:  Diagnosis Date   ALLERGIC RHINITIS 04/03/2007   Anal fissure    ANXIETY 04/03/2007   DEPRESSION 04/03/2007   Dysrhythmia    palpitations due to stress   ECZEMA 03/19/2009   Heartburn in pregnancy    Hemorrhoids    HYPERLIPIDEMIA 12/09/2009   HYPERTENSION 04/03/2007   MIGRAINE, COMMON 12/09/2009   Postoperative anemia due to acute blood loss  08/30/2012   Shortness of breath    with anxiety    Patient Active Problem List   Diagnosis Date Noted   Chest pain 08/08/2020   Acute upper back pain 08/06/2020   Sinusitis 04/18/2020   Viral illness 10/08/2019   Abdominal wall pain in both upper quadrants 12/12/2018   Patellofemoral arthralgia of both knees 11/15/2018   Left knee pain 09/28/2018   Palpitations 07/10/2017   Viral upper respiratory tract infection 02/20/2017   Thrombosed external hemorrhoid 12/29/2016   Menstrual periods irregular 10/06/2016   Hypokalemia 10/06/2016   Hemorrhoids 10/19/2015   Low back pain 09/14/2015   Allergic conjunctivitis 01/21/2015   Low back strain 05/14/2014   Chronic constipation 03/26/2013   Abdominal pain, unspecified site 03/26/2013   Amenorrhea 02/18/2011   Encounter for well adult exam with abnormal findings 09/27/2010   Hyperlipidemia 12/09/2009   MIGRAINE, COMMON 12/09/2009   GANGLION CYST, WRIST, LEFT 12/09/2009   Eczema 03/19/2009   PARESTHESIA 09/23/2008   Anxiety state 04/03/2007   Depression 04/03/2007   Essential hypertension 04/03/2007   Allergic rhinitis 04/03/2007    Past Surgical History:  Procedure Laterality Date   CESAREAN SECTION  2010   CESAREAN SECTION N/A 08/29/2012   Procedure: Repeat CESAREAN SECTION;  Surgeon: Lovenia Kim, MD;  Location: [facility];  Service: Obstetrics;  Laterality: N/A;  EDD: 09/17/12;REQUEST DEE;[name]     OB History     Gravida  3   Para  2  Term  1   Preterm      AB  1   Living  2      SAB      IAB      Ectopic      Multiple      Live Births  1           Family History  Problem Relation Age of Onset   Diabetes Mother    Hypertension Mother    Heart disease Mother    Hyperlipidemia Mother    Diabetes Father    Hypertension Father    Diabetes Brother    Hypertension Brother    Diabetes Maternal Grandmother    Hypertension Maternal Grandmother    Thyroid disease Maternal Grandmother     Hyperlipidemia Maternal Grandmother    Liver cancer Maternal Grandfather        mets   Hypertension Maternal Grandfather    Other Neg Hx        hyperaldosteronism    Social History   Tobacco Use   Smoking status: Never   Smokeless tobacco: Never  Vaping Use   Vaping Use: Never used  Substance Use Topics   Alcohol use: No    Alcohol/week: 0.0 standard drinks   Drug use: No    Home Medications Prior to Admission medications   Medication Sig Start Date End Date Taking? Authorizing Provider  AMBULATORY NON FORMULARY MEDICATION Diltiazem 2% mixed with Lidocaine 5%, ratio 1:1  Apply to rectum 2-3 times a day 06/08/16   Gatha Mayer, MD  amLODipine (NORVASC) 2.5 MG tablet Take 1 tablet (2.5 mg total) by mouth daily. 07/10/20   Biagio Borg, MD  azelastine (OPTIVAR) 0.05 % ophthalmic solution Place 1 drop into both eyes 2 (two) times daily. 02/22/19   Biagio Borg, MD  carbamazepine (TEGRETOL XR) 100 MG 12 hr tablet Take 1 tablet (100 mg total) by mouth 2 (two) times daily. 09/14/20   Biagio Borg, MD  cetirizine (ZYRTEC) 10 MG tablet Take 1 tablet (10 mg total) by mouth daily. Patient taking differently: Take 10 mg by mouth as needed. 01/21/15   Biagio Borg, MD  cyclobenzaprine (FLEXERIL) 5 MG tablet Take 1 tablet (5 mg total) by mouth 3 (three) times daily as needed. for muscle spams 06/30/20   Biagio Borg, MD  diltiazem 2 % GEL Apply 1 application topically 3 (three) times daily. 02/25/16   Gatha Mayer, MD  Epinastine HCl 0.05 % ophthalmic solution Place 1 drop into both eyes 2 (two) times daily. 09/28/18   Biagio Borg, MD  hydrALAZINE (APRESOLINE) 25 MG tablet 1 tab by mouth three times daily as needed for SBP > 160 07/10/20   Biagio Borg, MD  HYDROcodone-acetaminophen (NORCO/VICODIN) 5-325 MG tablet Take 1 tablet by mouth every 6 (six) hours as needed for moderate pain. 09/14/20   Biagio Borg, MD  Lidocaine-Hydrocortisone Ace 3-2.5 % KIT Use as directed every 12 hrs as needed  12/29/16   Biagio Borg, MD  metoprolol succinate (TOPROL XL) 25 MG 24 hr tablet Take 1 tablet (25 mg total) by mouth daily. 04/15/20   Biagio Borg, MD  mometasone (ELOCON) 0.1 % ointment Apply topically daily. 07/19/18   Kennith Gain, MD  Olopatadine HCl (PAZEO) 0.7 % SOLN Place 1 drop into both eyes daily. 07/19/18   Kennith Gain, MD  pimecrolimus (ELIDEL) 1 % cream Apply topically 2 (two) times daily. 07/19/18  Kennith Gain, MD  potassium chloride (KLOR-CON) 10 MEQ tablet Take 2 tablets (20 mEq total) by mouth daily. 10/03/18   Biagio Borg, MD  spironolactone (ALDACTONE) 50 MG tablet Take 50 mg by mouth daily.    [provider]  traMADol (ULTRAM) 50 MG tablet Take 1 tablet (50 mg total) by mouth every 6 (six) hours as needed. 12/29/16   Biagio Borg, MD  triamcinolone (NASACORT AQ) 55 MCG/ACT AERO nasal inhaler Place 2 sprays into the nose daily. 02/22/19   Biagio Borg, MD  triamcinolone cream (KENALOG) 0.1 % Apply 1 application topically 2 (two) times daily. 03/26/18   Biagio Borg, MD    Allergies    Dog epithelium, Latex, Peanut-containing drug products, Penicillins, and Tomato (diagnostic)  Review of Systems   Review of Systems  Constitutional:  Positive for activity change.  Eyes:  Positive for photophobia.  Respiratory:  Negative for shortness of breath.   Cardiovascular:  Negative for chest pain.  Gastrointestinal:  Negative for nausea and vomiting.  Neurological:  Positive for dizziness and headaches.  All other systems reviewed and are negative.  Physical Exam Updated Vital Signs BP 125/80   Pulse 88   Temp 97.6 F (36.4 C) (Oral)   Resp 18   Ht _0  (1.626 m)   Wt 71.2 kg   LMP 09/15/2020   SpO2 99%   BMI 26.95 kg/m   Physical Exam Vitals and nursing note reviewed.  Constitutional:      Appearance: She is well-developed.  HENT:     Head: Atraumatic.  Eyes:     Extraocular Movements: Extraocular movements  intact.     Pupils: Pupils are equal, round, and reactive to light.     Comments: No evidence of papilledema with the limited ED otoscope exam  Cardiovascular:     Rate and Rhythm: Normal rate.  Pulmonary:     Effort: Pulmonary effort is normal.  Musculoskeletal:        General: No swelling or tenderness.     Cervical back: Normal range of motion and neck supple.  Skin:    General: Skin is warm and dry.  Neurological:     General: No focal deficit present.     Mental Status: She is alert and oriented to person, place, and time.     Cranial Nerves: No cranial nerve deficit.     Sensory: No sensory deficit.     Motor: No weakness.     Coordination: Coordination normal.    ED Results / Procedures / Treatments   Labs (all labs ordered are listed, but only abnormal results are displayed) Labs Reviewed  URINALYSIS, ROUTINE W REFLEX MICROSCOPIC - Abnormal; Notable for the following components:      Result Value   Color, Urine STRAW (*)    Specific Gravity, Urine <1.005 (*)    All other components within normal limits  COMPREHENSIVE METABOLIC PANEL  CBC WITH DIFFERENTIAL/PLATELET  I-STAT BETA HCG BLOOD, ED ([facility], [facility], [facility] ONLY)  TROPONIN I (HIGH SENSITIVITY)  TROPONIN I (HIGH SENSITIVITY)    EKG EKG Interpretation  Date/Time:  Tuesday September 15 2020 19:16:55 EDT Ventricular Rate:  85 PR Interval:  162 QRS Duration: 91 QT Interval:  379 QTC Calculation: 451 R Axis:   63 Text Interpretation: Sinus rhythm Borderline T abnormalities, anterior leads No acute changes No significant change since last tracing Confirmed by Varney Biles [id number]) on 09/15/2020 10:07:02 PM  Radiology CT Angio Head W or Wo  Contrast  Result Date: 09/15/2020 CLINICAL DATA:  Headache and dizziness EXAM: CT ANGIOGRAPHY HEAD AND NECK TECHNIQUE: Multidetector CT imaging of the head and neck was performed using the standard protocol during bolus administration of intravenous contrast. Multiplanar CT image  reconstructions and MIPs were obtained to evaluate the vascular anatomy. Carotid stenosis measurements (when applicable) are obtained utilizing NASCET criteria, using the distal internal carotid diameter as the denominator. CONTRAST:  51m OMNIPAQUE IOHEXOL 350 MG/ML SOLN COMPARISON:  None. FINDINGS: CT HEAD FINDINGS Brain: There is no mass, hemorrhage or extra-axial collection. The size and configuration of the ventricles and extra-axial CSF spaces are normal. There is no acute or chronic infarction. The brain parenchyma is normal. Skull: The visualized skull base, calvarium and extracranial soft tissues are normal. Sinuses/Orbits: No fluid levels or advanced mucosal thickening of the visualized paranasal sinuses. No mastoid or middle ear effusion. The orbits are normal. CTA NECK FINDINGS SKELETON: There is no bony spinal canal stenosis. No lytic or blastic lesion. OTHER NECK: Normal pharynx, larynx and major salivary glands. No cervical lymphadenopathy. Heterogeneous thyroid gland. UPPER CHEST: No pneumothorax or pleural effusion. No nodules or masses. AORTIC ARCH: There is no calcific atherosclerosis of the aortic arch. There is no aneurysm, dissection or hemodynamically significant stenosis of the visualized portion of the aorta. Conventional 3 vessel aortic branching pattern. The visualized proximal subclavian arteries are widely patent. RIGHT CAROTID SYSTEM: Normal without aneurysm, dissection or stenosis. LEFT CAROTID SYSTEM: Normal without aneurysm, dissection or stenosis. VERTEBRAL ARTERIES: Left dominant configuration. Both origins are clearly patent. There is no dissection, occlusion or flow-limiting stenosis to the skull base (V1-V3 segments). CTA HEAD FINDINGS POSTERIOR CIRCULATION: --Vertebral arteries: Normal V4 segments. --Inferior cerebellar arteries: Normal. --Basilar artery: Normal. --Superior cerebellar arteries: Normal. --Posterior cerebral arteries (PCA): Normal. ANTERIOR CIRCULATION:  --Intracranial internal carotid arteries: Normal. --Anterior cerebral arteries (ACA): Normal. Both A1 segments are present. Patent anterior communicating artery (a-comm). --Middle cerebral arteries (MCA): Normal. VENOUS SINUSES: As permitted by contrast timing, patent. ANATOMIC VARIANTS: None Review of the MIP images confirms the above findings. IMPRESSION: 1. Normal CTA of the head and neck. 2. Heterogeneous thyroid gland. Thyroid ultrasound recommended (ref: J Am Coll Radiol. 2015 Feb;12(2): 143-50). Electronically Signed   By: KUlyses JarredM.D.   On: 09/15/2020 22:14   DG Chest 2 View  Result Date: 09/15/2020 CLINICAL DATA:  Shortness of breath, dizziness, near syncope EXAM: CHEST - 2 VIEW COMPARISON:  08/06/2020 FINDINGS: Lungs are clear.  No pleural effusion or pneumothorax. The heart is normal in size. Visualized osseous structures are within normal limits. IMPRESSION: Normal chest radiographs. Electronically Signed   By: SJulian HyM.D.   On: 09/15/2020 19:07   CT Angio Neck W and/or Wo Contrast  Result Date: 09/15/2020 CLINICAL DATA:  Headache and dizziness EXAM: CT ANGIOGRAPHY HEAD AND NECK TECHNIQUE: Multidetector CT imaging of the head and neck was performed using the standard protocol during bolus administration of intravenous contrast. Multiplanar CT image reconstructions and MIPs were obtained to evaluate the vascular anatomy. Carotid stenosis measurements (when applicable) are obtained utilizing NASCET criteria, using the distal internal carotid diameter as the denominator. CONTRAST:  832mOMNIPAQUE IOHEXOL 350 MG/ML SOLN COMPARISON:  None. FINDINGS: CT HEAD FINDINGS Brain: There is no mass, hemorrhage or extra-axial collection. The size and configuration of the ventricles and extra-axial CSF spaces are normal. There is no acute or chronic infarction. The brain parenchyma is normal. Skull: The visualized skull base, calvarium and extracranial soft tissues are normal. Sinuses/Orbits:  No  fluid levels or advanced mucosal thickening of the visualized paranasal sinuses. No mastoid or middle ear effusion. The orbits are normal. CTA NECK FINDINGS SKELETON: There is no bony spinal canal stenosis. No lytic or blastic lesion. OTHER NECK: Normal pharynx, larynx and major salivary glands. No cervical lymphadenopathy. Heterogeneous thyroid gland. UPPER CHEST: No pneumothorax or pleural effusion. No nodules or masses. AORTIC ARCH: There is no calcific atherosclerosis of the aortic arch. There is no aneurysm, dissection or hemodynamically significant stenosis of the visualized portion of the aorta. Conventional 3 vessel aortic branching pattern. The visualized proximal subclavian arteries are widely patent. RIGHT CAROTID SYSTEM: Normal without aneurysm, dissection or stenosis. LEFT CAROTID SYSTEM: Normal without aneurysm, dissection or stenosis. VERTEBRAL ARTERIES: Left dominant configuration. Both origins are clearly patent. There is no dissection, occlusion or flow-limiting stenosis to the skull base (V1-V3 segments). CTA HEAD FINDINGS POSTERIOR CIRCULATION: --Vertebral arteries: Normal V4 segments. --Inferior cerebellar arteries: Normal. --Basilar artery: Normal. --Superior cerebellar arteries: Normal. --Posterior cerebral arteries (PCA): Normal. ANTERIOR CIRCULATION: --Intracranial internal carotid arteries: Normal. --Anterior cerebral arteries (ACA): Normal. Both A1 segments are present. Patent anterior communicating artery (a-comm). --Middle cerebral arteries (MCA): Normal. VENOUS SINUSES: As permitted by contrast timing, patent. ANATOMIC VARIANTS: None Review of the MIP images confirms the above findings. IMPRESSION: 1. Normal CTA of the head and neck. 2. Heterogeneous thyroid gland. Thyroid ultrasound recommended (ref: J Am Coll Radiol. 2015 Feb;12(2): 143-50). Electronically Signed   By: Ulyses Jarred M.D.   On: 09/15/2020 22:14    Procedures Procedures   Medications Ordered in ED Medications   ketorolac (TORADOL) 30 MG/ML injection 15 mg (has no administration in time range)  metoCLOPramide (REGLAN) injection 10 mg (has no administration in time range)  iohexol (OMNIPAQUE) 350 MG/ML injection 80 mL (80 mLs Intravenous Contrast Given 09/15/20 2140)    ED Course  I have reviewed the triage vital signs and the nursing notes.  Pertinent labs & imaging results that were available during my care of the patient were reviewed by me and considered in my medical decision making (see chart for details).  Clinical Course as of 09/16/20 0016  Wed Sep 16, 2020  0016 The patient appears reasonably screened and/or stabilized for discharge and I doubt any other medical condition or other [facility] requiring further screening, evaluation, or treatment in the ED at this time prior to discharge.   Results from the ER workup discussed with the patient face to face and all questions answered to the best of my ability. The patient is safe for discharge with strict return precautions.   [AN]    Clinical Course User Index [AN] Varney Biles, MD   MDM Rules/Calculators/A&P                            Patient comes in with chief complaint of dizziness.  Her dizziness which is described as lightheadedness and unsteady gait with near fainting feeling on 1 occasion lasted for several minutes.  Differential diagnosis would include TIA given the prolonged duration of the symptoms.  Orthostatic less likely as the symptoms persisted until patient got better.  She does have some metabolic disorder.  We will get CT angiogram head and neck to assess for any evidence of carotid or posterior circulation stenosis.  Patient currently symptom-free which is reassuring with nonfocal neuro exam.  Other possibility considered include tacky dysrhythmia.  Patient placed on telemetry.  EKG is reassuring.  Finally, patient is  having persistent nightly headaches for the last several days which are different than her migraines.   No papilledema on her evaluation.  She denies any nausea -and it seems like the outpatient doctor has ordered MRI.  Idiopathic intracranial hypertension in the differential.  She does not have any hypercoagulability and no risk factors for thrombosis, with the other possibility includes thrombosis.  For now, we will stick with CT angiograms -and have her get the outpatient MRI as planned.  Final Clinical Impression(s) / ED Diagnoses Final diagnoses:  Thyroid nodule  Dizziness  Chronic daily headache    Rx / DC Orders ED Discharge Orders     None        Varney Biles, MD 09/16/20 [id number]

## 2020-09-19 ENCOUNTER — Encounter: Payer: Self-pay | Admitting: Internal Medicine

## 2020-09-19 DIAGNOSIS — R519 Headache, unspecified: Secondary | ICD-10-CM | POA: Insufficient documentation

## 2020-09-19 DIAGNOSIS — H9192 Unspecified hearing loss, left ear: Secondary | ICD-10-CM | POA: Insufficient documentation

## 2020-09-19 NOTE — Assessment & Plan Note (Signed)
Also left ear wax irrigated clear and hearing improvED  Ceruminosis is noted.  Wax is removed by syringing and manual debridement. Instructions for home care to prevent wax buildup are given.

## 2020-09-19 NOTE — Assessment & Plan Note (Signed)
Also for mucinec bid prn given left ear probable eustachian valve symtpoms,  to f/u any worsening symptoms or concerns

## 2020-09-19 NOTE — Assessment & Plan Note (Signed)
BP Readings from Last 3 Encounters:  09/16/20 125/78  09/14/20 (!) 146/78  08/06/20 120/78   Mild uncontrolled at today visit, pt to continue medical treatment norvas, hydralazine, toprol, aldactone

## 2020-09-19 NOTE — Assessment & Plan Note (Signed)
I suspect possible right trigeminal neuralgia - for trial tegretol, MRI and pain control hydrocodone, and consider NS vs neurology referral depending on tegretol trial results

## 2020-09-24 ENCOUNTER — Other Ambulatory Visit: Payer: Self-pay

## 2020-09-24 ENCOUNTER — Encounter: Payer: Self-pay | Admitting: Internal Medicine

## 2020-09-24 ENCOUNTER — Ambulatory Visit: Payer: BC Managed Care – PPO | Admitting: Internal Medicine

## 2020-09-24 VITALS — BP 138/96 | HR 80 | Temp 98.2°F | Ht 64.0 in | Wt 153.0 lb

## 2020-09-24 DIAGNOSIS — I700 Atherosclerosis of aorta: Secondary | ICD-10-CM | POA: Diagnosis not present

## 2020-09-24 DIAGNOSIS — E7800 Pure hypercholesterolemia, unspecified: Secondary | ICD-10-CM | POA: Diagnosis not present

## 2020-09-24 DIAGNOSIS — I10 Essential (primary) hypertension: Secondary | ICD-10-CM | POA: Diagnosis not present

## 2020-09-24 DIAGNOSIS — R946 Abnormal results of thyroid function studies: Secondary | ICD-10-CM | POA: Diagnosis not present

## 2020-09-24 NOTE — Patient Instructions (Signed)
You will be contacted regarding the referral for: thyroid ultrasound  Please continue all other medications as before, and refills have been done if requested.  Please have the pharmacy call with any other refills you may need.  Please continue your efforts at being more active, low cholesterol diet, and weight control.  Please keep your appointments with your specialists as you may have planned

## 2020-09-24 NOTE — Progress Notes (Signed)
Patient ID: Kristie Nelson, female   DOB: [birth year], 40 y.o.   MRN: [id number]        Chief Complaint: follow up abnormal CT scan       HPI:  Kristie Nelson is a 40 y.o. female here with c/o abnormal CT scan mentioning thyroid possible abnormal, now here for f/u as advised per ED.  Denies hyper or hypo thyroid symptoms such as voice, skin or hair change.   Pt denies chest pain, increased sob or doe, wheezing, orthopnea, PND, increased LE swelling, palpitations, dizziness or syncope.  Pt denies polydipsia, polyuria, or new focal neuro s/s.   Pt denies fever, wt loss, night sweats, loss of appetite, or other constitutional symptoms      Wt Readings from Last 3 Encounters:  09/24/20 153 lb (69.4 kg)  09/15/20 157 lb (71.2 kg)  09/14/20 157 lb (71.2 kg)   BP Readings from Last 3 Encounters:  09/24/20 (!) 138/96  09/16/20 125/78  09/14/20 (!) 146/78         Past Medical History:  Diagnosis Date   ALLERGIC RHINITIS 04/03/2007   Anal fissure    ANXIETY 04/03/2007   DEPRESSION 04/03/2007   Dysrhythmia    palpitations due to stress   ECZEMA 03/19/2009   Heartburn in pregnancy    Hemorrhoids    HYPERLIPIDEMIA 12/09/2009   HYPERTENSION 04/03/2007   MIGRAINE, COMMON 12/09/2009   Postoperative anemia due to acute blood loss 08/30/2012   Shortness of breath    with anxiety   Past Surgical History:  Procedure Laterality Date   CESAREAN SECTION  2010   CESAREAN SECTION N/A 08/29/2012   Procedure: Repeat CESAREAN SECTION;  Surgeon: Lovenia Kim, MD;  Location: [facility];  Service: Obstetrics;  Laterality: N/A;  EDD: 09/17/12;REQUEST DEE;Kristie Nelson    reports that she has never smoked. She has never used smokeless tobacco. She reports that she does not drink alcohol and does not use drugs. family history includes Diabetes in her brother, father, maternal grandmother, and mother; Heart disease in her mother; Hyperlipidemia in her maternal grandmother and mother; Hypertension in her brother, father,  maternal grandfather, maternal grandmother, and mother; Liver cancer in her maternal grandfather; Thyroid disease in her maternal grandmother. Allergies  Allergen Reactions   Dog Epithelium Itching   Latex Itching    swelling   Peanut-Containing Drug Products     REACTION: throat swelling   Penicillins     REACTION: itching   Tomato (Diagnostic) Itching and Swelling   Current Outpatient Medications on File Prior to Visit  Medication Sig Dispense Refill   AMBULATORY NON FORMULARY MEDICATION Diltiazem 2% mixed with Lidocaine 5%, ratio 1:1  Apply to rectum 2-3 times a day 30 g 2   amLODipine (NORVASC) 2.5 MG tablet Take 1 tablet (2.5 mg total) by mouth daily. 90 tablet 3   azelastine (OPTIVAR) 0.05 % ophthalmic solution Place 1 drop into both eyes 2 (two) times daily. 6 mL 12   carbamazepine (TEGRETOL XR) 100 MG 12 hr tablet Take 1 tablet (100 mg total) by mouth 2 (two) times daily. 60 tablet 5   cetirizine (ZYRTEC) 10 MG tablet Take 1 tablet (10 mg total) by mouth daily. (Patient taking differently: Take 10 mg by mouth as needed.) 30 tablet 11   cyclobenzaprine (FLEXERIL) 5 MG tablet Take 1 tablet (5 mg total) by mouth 3 (three) times daily as needed. for muscle spams 60 tablet 2   diltiazem 2 % GEL Apply 1 application topically  3 (three) times daily. 30 g 3   Epinastine HCl 0.05 % ophthalmic solution Place 1 drop into both eyes 2 (two) times daily. 10 mL 12   hydrALAZINE (APRESOLINE) 25 MG tablet 1 tab by mouth three times daily as needed for SBP > 160 90 tablet 11   HYDROcodone-acetaminophen (NORCO/VICODIN) 5-325 MG tablet Take 1 tablet by mouth every 6 (six) hours as needed for moderate pain. 30 tablet 0   Lidocaine-Hydrocortisone Ace 3-2.5 % KIT Use as directed every 12 hrs as needed 1 each 0   metoprolol succinate (TOPROL XL) 25 MG 24 hr tablet Take 1 tablet (25 mg total) by mouth daily. 90 tablet 3   mometasone (ELOCON) 0.1 % ointment Apply topically daily. 45 g 0   Olopatadine HCl  (PAZEO) 0.7 % SOLN Place 1 drop into both eyes daily. 2.5 mL 5   pimecrolimus (ELIDEL) 1 % cream Apply topically 2 (two) times daily. 60 g 4   potassium chloride (KLOR-CON) 10 MEQ tablet Take 2 tablets (20 mEq total) by mouth daily. 180 tablet 3   spironolactone (ALDACTONE) 50 MG tablet Take 50 mg by mouth daily.     traMADol (ULTRAM) 50 MG tablet Take 1 tablet (50 mg total) by mouth every 6 (six) hours as needed. 30 tablet 0   triamcinolone (NASACORT AQ) 55 MCG/ACT AERO nasal inhaler Place 2 sprays into the nose daily. 1 Inhaler 12   triamcinolone cream (KENALOG) 0.1 % Apply 1 application topically 2 (two) times daily. 45 g 2   No current facility-administered medications on file prior to visit.        ROS:  All others reviewed and negative.  Objective        PE:  BP (!) 138/96 (BP Location: Left Arm, Patient Position: Sitting, Cuff Size: Normal)   Pulse 80   Temp 98.2 F (36.8 C) (Oral)   Ht _0  (1.626 m)   Wt 153 lb (69.4 kg)   LMP 09/15/2020   SpO2 100%   BMI 26.26 kg/m                 Constitutional: Pt appears in NAD               HENT: Head: NCAT.                Right Ear: External ear normal.                 Left Ear: External ear normal.                Eyes: . Pupils are equal, round, and reactive to light. Conjunctivae and EOM are normal               Nose: without d/c or deformity               Neck: Neck supple. Gross normal ROM; thyroid normal size no nodules found               Cardiovascular: Normal rate and regular rhythm.                 Pulmonary/Chest: Effort normal and breath sounds without rales or wheezing.                Abd:  Soft, NT, ND, + BS, no organomegaly               Neurological: Pt is alert. At baseline orientation, motor grossly intact  Skin: Skin is warm. No rashes, no other new lesions, LE edema - none               Psychiatric: Pt behavior is normal without agitation   Micro: none  Cardiac tracings I have personally  interpreted today:  none  Pertinent Radiological findings (summarize): CTA neck Sep 15 2020 IMPRESSION: 1. Normal CTA of the head and neck. 2. Heterogeneous thyroid gland. Thyroid ultrasound recommended (ref: J Am Coll Radiol. 2015 Feb;12(2): 143-50).    Lab Results  Component Value Date   WBC 7.6 09/15/2020   HGB 13.0 09/15/2020   HCT 39.2 09/15/2020   PLT 325 09/15/2020   GLUCOSE 94 09/15/2020   CHOL 192 03/17/2020   TRIG 133.0 03/17/2020   HDL 39.00 (L) 03/17/2020   LDLDIRECT 127.0 07/11/2017   LDLCALC 127 (H) 03/17/2020   ALT 13 09/15/2020   AST 18 09/15/2020   NA 136 09/15/2020   K 3.5 09/15/2020   CL 104 09/15/2020   CREATININE 0.80 09/15/2020   BUN 17 09/15/2020   CO2 24 09/15/2020   TSH 0.71 03/17/2020   Assessment/Plan:  Kristie Nelson is a 40 y.o. Black or African American [2] female with  has a past medical history of ALLERGIC RHINITIS (04/03/2007), Anal fissure, ANXIETY (04/03/2007), DEPRESSION (04/03/2007), Dysrhythmia, ECZEMA (03/19/2009), Heartburn in pregnancy, Hemorrhoids, HYPERLIPIDEMIA (12/09/2009), HYPERTENSION (04/03/2007), MIGRAINE, COMMON (12/09/2009), Postoperative anemia due to acute blood loss (08/30/2012), and Shortness of breath.  Abnormal thyroid exam Pt with abnormal appearing thyroid on CTA neck  - now for thyroid u/s r/o nodule or other abnormal,  to f/u any worsening symptoms or concerns  Lab Results  Component Value Date   TSH 0.71 03/17/2020     Aortic atherosclerosis (HCC) Also noted on imaging, pt for exercise, low chol diet, declines statin for now  Hyperlipidemia Lab Results  Component Value Date   LDLCALC 127 (H) 03/17/2020   Uncontrolled, goal ldl < 70, pt to continue current low chol diet, delcines statin for now   Essential hypertension BP Readings from Last 3 Encounters:  09/24/20 (!) 138/96  09/16/20 125/78  09/14/20 (!) 146/78   Uncontrolled, possibly situational today with anxiety, pt to continue medical treatment  norvasc, hydralazine, toprol, aldactone - declines change  Followup: Return if symptoms worsen or fail to improve.  Cathlean Cower, MD 09/27/2020 2:10 PM [facility]

## 2020-09-27 ENCOUNTER — Encounter: Payer: Self-pay | Admitting: Internal Medicine

## 2020-09-27 ENCOUNTER — Ambulatory Visit
Admission: RE | Admit: 2020-09-27 | Discharge: 2020-09-27 | Disposition: A | Discharge status: Home / Self Care | Payer: BC Managed Care – PPO | Source: Ambulatory Visit | Attending: Internal Medicine | Admitting: Internal Medicine

## 2020-09-27 ENCOUNTER — Other Ambulatory Visit: Payer: Self-pay

## 2020-09-27 DIAGNOSIS — G44091 Other trigeminal autonomic cephalgias (TAC), intractable: Secondary | ICD-10-CM

## 2020-09-27 DIAGNOSIS — I700 Atherosclerosis of aorta: Secondary | ICD-10-CM | POA: Insufficient documentation

## 2020-09-27 NOTE — Assessment & Plan Note (Signed)
Also noted on imaging, pt for exercise, low chol diet, declines statin for now

## 2020-09-27 NOTE — Assessment & Plan Note (Signed)
Pt with abnormal appearing thyroid on CTA neck  - now for thyroid u/s r/o nodule or other abnormal,  to f/u any worsening symptoms or concerns  Lab Results  Component Value Date   TSH 0.71 03/17/2020

## 2020-09-27 NOTE — Assessment & Plan Note (Signed)
BP Readings from Last 3 Encounters:  09/24/20 (!) 138/96  09/16/20 125/78  09/14/20 (!) 146/78   Uncontrolled, possibly situational today with anxiety, pt to continue medical treatment norvasc, hydralazine, toprol, aldactone - declines change

## 2020-09-27 NOTE — Assessment & Plan Note (Signed)
Lab Results  Component Value Date   LDLCALC 127 (H) 03/17/2020   Uncontrolled, goal ldl < 70, pt to continue current low chol diet, delcines statin for now

## 2020-09-28 ENCOUNTER — Telehealth: Payer: Self-pay

## 2020-09-28 ENCOUNTER — Telehealth: Payer: Self-pay | Admitting: Internal Medicine

## 2020-09-28 NOTE — Telephone Encounter (Signed)
The MRI was ok of course, so mucinex twice per day as needed can help clear fluid from behind the ear drum;  BP can be probably better addressed per renal

## 2020-09-28 NOTE — Telephone Encounter (Signed)
Patient says pulmonary dr will not see her w/ out the most recent office notes & lab results from Dr. Jonny Ruiz  Please send to:  [facility] Dr. Marisue Humble

## 2020-09-28 NOTE — Telephone Encounter (Signed)
Pt last seen on 09/24/2020. Patient is experiencing pain in right ear and had discussed on last visit that the ear had fluid build up. Is there something that can be done?  Also b/p is unstable either high or low, she is wondering does she need to see Dr.John or her Kidney Specialist.   Albin Felling [phone]

## 2020-09-28 NOTE — Telephone Encounter (Signed)
Patient advised.

## 2020-09-30 NOTE — Telephone Encounter (Signed)
Sorry I am confused, since all my notes on her are done.  And Dr Marisue Humble is a kidney doctor, not pulmonary, so not sure what to say, thanks

## 2020-09-30 NOTE — Telephone Encounter (Signed)
See below

## 2020-10-02 ENCOUNTER — Other Ambulatory Visit: Payer: BC Managed Care – PPO

## 2020-10-08 ENCOUNTER — Telehealth: Payer: Self-pay

## 2020-10-08 NOTE — Telephone Encounter (Signed)
PA has been started for HYDROcodone-Acetaminophen.   Key: [id number]

## 2020-10-09 ENCOUNTER — Encounter: Payer: Self-pay | Admitting: Internal Medicine

## 2020-10-09 ENCOUNTER — Ambulatory Visit
Admission: RE | Admit: 2020-10-09 | Discharge: 2020-10-09 | Disposition: A | Discharge status: Home / Self Care | Payer: BC Managed Care – PPO | Source: Ambulatory Visit | Attending: Internal Medicine | Admitting: Internal Medicine

## 2020-10-09 DIAGNOSIS — R946 Abnormal results of thyroid function studies: Secondary | ICD-10-CM

## 2020-10-22 ENCOUNTER — Ambulatory Visit: Payer: BC Managed Care – PPO | Admitting: Psychiatry

## 2020-10-22 ENCOUNTER — Encounter: Payer: Self-pay | Admitting: Psychiatry

## 2020-10-22 VITALS — BP 136/85 | HR 88 | Ht 63.0 in | Wt 157.0 lb

## 2020-10-22 DIAGNOSIS — G43009 Migraine without aura, not intractable, without status migrainosus: Secondary | ICD-10-CM

## 2020-10-22 DIAGNOSIS — I639 Cerebral infarction, unspecified: Secondary | ICD-10-CM | POA: Diagnosis not present

## 2020-10-22 DIAGNOSIS — R519 Headache, unspecified: Secondary | ICD-10-CM | POA: Diagnosis not present

## 2020-10-22 MED ORDER — DICLOFENAC POTASSIUM 50 MG PO TABS: ORAL_TABLET | ORAL | 2 refills | Status: DC

## 2020-10-22 NOTE — Progress Notes (Signed)
Referring:  Varney Biles, [address],  [place] [address]  PCP: Biagio Borg, MD  Neurology was asked to evaluate Kristie Nelson, a 40 year old female for a chief complaint of headaches.  Our recommendations of care will be communicated by shared medical record.    CC:  headaches  HPI:  Medical co-morbidities: migraines, HTN, DM  The patient presents for evaluation of headaches which began last summer. She had a headache nearly every day over the span of a few weeks. They improved for several months then started back again this summer. Headaches are described as right retro-orbital 10/10 stabbing pain. No associated migrainous features or ipsilateral autonomic features. They would last for 2 hours at a time. Would occur every night between 12-1 am.   Headaches have been improving in severity and frequency since she was started on carbamazepine 100 mg BID. Currently she'll feel a twinge of pain for a few seconds every day which resolves on its own.  She does have a history of migraines and states this headache felt different. Notes her migraines have increased in frequency recently. May have 2 per month, though she has had episodes of migraines lasting for a week at a time.  Headache History: Onset: last summer Triggers: none Most common time of day for headache to begin: middle of the night between 12-1 am Aura: no Location: right eye radiating down to jaw Quality/Description: sharp pain Severity: 10/10 Associated Symptoms:  Photophobia: no  Phonophobia: no  Nausea: no Other symptoms: dizziness Worse with activity?: no Duration of headaches: initially 2 hours, now a couple of seconds  Headache days per month: 30 Headache free days per month: 0  Current Treatment: Abortive none  Preventative Carbamazepine 100 mg BID  Prior Therapies                                 Carbamazepine 100 mg BID Flexeril 5 mg TID PRN Metoprolol 25 mg daily Imitrex 100  mg PRN - side effects  Headache Risk Factors: Headache risk factors and/or co-morbidities (+) Neck Pain (-) History of Motor Vehicle Accident (-) Sleep Disorder (-) Fibromyalgia (-) Obesity  Body mass index is 27.81 kg/m. (-) History of Traumatic Brain Injury and/or Concussion  LABS: CBC    Component Value Date/Time   WBC 7.6 09/15/2020 1906   RBC 4.17 09/15/2020 1906   HGB 13.0 09/15/2020 1906   HCT 39.2 09/15/2020 1906   PLT 325 09/15/2020 1906   MCV 94.0 09/15/2020 1906   MCH 31.2 09/15/2020 1906   MCHC 33.2 09/15/2020 1906   RDW 12.7 09/15/2020 1906   LYMPHSABS 1.9 09/15/2020 1906   MONOABS 0.3 09/15/2020 1906   EOSABS 0.1 09/15/2020 1906   BASOSABS 0.1 09/15/2020 1906   CMP Latest Ref Rng & Units 09/15/2020 03/17/2020 10/10/2018  Glucose 70 - 99 mg/dL 94 96 83  BUN 6 - 20 mg/dL '17 18 11  ' Creatinine 0.44 - 1.00 mg/dL 0.80 0.76 0.72  Sodium 135 - 145 mmol/L 136 137 140  Potassium 3.5 - 5.1 mmol/L 3.5 3.8 3.1(L)  Chloride 98 - 111 mmol/L 104 102 101  CO2 22 - 32 mmol/L '24 30 30  ' Calcium 8.9 - 10.3 mg/dL 9.3 9.8 9.7  Total Protein 6.5 - 8.1 g/dL 8.1 7.8 -  Total Bilirubin 0.3 - 1.2 mg/dL 0.3 0.4 -  Alkaline Phos 38 - 126 U/L 43 39 -  AST 15 - 41 U/L 18 17 -  ALT 0 - 44 U/L 13 13 -     IMAGING:  MRI brain 09/27/20: nonspecific white matter changes, 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 90 tablet 3   azelastine (OPTIVAR) 0.05 % ophthalmic solution Place 1 drop into both eyes 2 (two) times daily. 6 mL 12   carbamazepine (TEGRETOL XR) 100 MG 12 hr tablet Take 1 tablet (100 mg  total) by mouth 2 (two) times daily. 60 tablet 5   cetirizine (ZYRTEC) 10 MG tablet Take 1 tablet (10 mg total) by mouth daily. (Patient taking differently: Take 10 mg by mouth as needed.) 30 tablet 11   cyclobenzaprine (FLEXERIL) 5 MG tablet Take 1 tablet (5 mg total) by mouth 3 (three) times daily as needed. for muscle spams 60 tablet 2   diltiazem 2 % GEL Apply 1 application topically 3 (three) times daily. 30 g 3   Epinastine HCl 0.05 % ophthalmic solution Place 1 drop into both eyes 2 (two) times daily. 10 mL 12   hydrALAZINE (APRESOLINE) 25 MG tablet 1 tab by mouth three times daily as needed for SBP > 160 90 tablet 11   HYDROcodone-acetaminophen (NORCO/VICODIN) 5-325 MG tablet Take 1 tablet by mouth every 6 (six) hours as needed for moderate pain. 30 tablet 0   Lidocaine-Hydrocortisone Ace 3-2.5 % KIT Use as directed every 12 hrs as needed 1 each 0   metoprolol succinate (TOPROL XL) 25 MG 24 hr tablet Take 1 tablet (25 mg total) by mouth daily. 90 tablet 3   mometasone (ELOCON) 0.1 % ointment Apply topically daily. 45 g 0   Olopatadine HCl (PAZEO) 0.7 % SOLN Place 1 drop into both eyes daily. 2.5 mL 5   pimecrolimus (ELIDEL) 1 % cream Apply topically 2 (two) times daily. 60 g 4   potassium chloride (KLOR-CON) 10 MEQ tablet Take 2 tablets (20 mEq total) by mouth daily. 180 tablet 3   spironolactone (ALDACTONE) 50 MG tablet Take 50 mg by mouth daily.     traMADol (ULTRAM) 50 MG tablet Take 1 tablet (50 mg total) by mouth every 6 (six) hours as needed. 30 tablet 0   triamcinolone (NASACORT AQ) 55 MCG/ACT AERO nasal inhaler Place 2 sprays into the nose daily. 1 Inhaler 12   triamcinolone cream (KENALOG) 0.1 % Apply 1 application topically 2 (two) times daily. 45 g 2   No current facility-administered medications on file prior to visit.     Allergies: Allergies  Allergen Reactions   Dog Epithelium Itching   Latex Itching    swelling   Peanut-Containing Drug Products     REACTION:  throat swelling   Penicillins     REACTION: itching   Tomato (Diagnostic) Itching and Swelling    Family History: Migraine or other headaches in the family:  mother had migraines Aneurysms in a first degree relative:  no Brain tumors in the family:  grandmother Other neurological illness in the family:   no  Past Medical History: Past Medical History:  Diagnosis Date   ALLERGIC RHINITIS 04/03/2007  Anal fissure    ANXIETY 04/03/2007   DEPRESSION 04/03/2007   Dysrhythmia    palpitations due to stress   ECZEMA 03/19/2009   Heartburn in pregnancy    Hemorrhoids    HYPERLIPIDEMIA 12/09/2009   HYPERTENSION 04/03/2007   MIGRAINE, COMMON 12/09/2009   Postoperative anemia due to acute blood loss 08/30/2012   Shortness of breath    with anxiety    Past Surgical History Past Surgical History:  Procedure Laterality Date   CESAREAN SECTION  2010   CESAREAN SECTION N/A 08/29/2012   Procedure: Repeat CESAREAN SECTION;  Surgeon: Lovenia Kim, MD;  Location: [facility];  Service: Obstetrics;  Laterality: N/A;  EDD: 09/17/12;REQUEST DEE;[name]    Social History: Social History   Tobacco Use   Smoking status: Never   Smokeless tobacco: Never  Vaping Use   Vaping Use: Never used  Substance Use Topics   Alcohol use: No    Alcohol/week: 0.0 standard drinks   Drug use: No    ROS: Negative for fevers, chills. Positive for headaches. All other systems reviewed and negative unless stated otherwise in HPI.   Physical Exam:   Vital Signs: BP 136/85   Pulse 88   Ht '5\' 3"'  (1.6 m)   Wt 157 lb (71.2 kg)   BMI 27.81 kg/m  GENERAL: well appearing,in no acute distress,alert SKIN:  Color, texture, turgor normal. No rashes or lesions HEAD:  Normocephalic/atraumatic. CV:  RRR RESP: Normal respiratory effort MSK: no tenderness to palpation over occiput, neck, or shoulders  NEUROLOGICAL: Mental Status: Alert, oriented to person, place and time,Follows commands Cranial Nerves:  PERRL,visual fields intact to confrontation,extraocular movements intact,facial sensation intact,no facial droop or ptosis,hearing intact to finger rub bilaterally,no dysarthria,palate elevate symmetrically,tongue protrudes midline,shoulder shrug intact and symmetric Motor: muscle strength 5/5 both upper and lower extremities,no drift, normal tone Reflexes: 2+ throughout Sensation: intact to light touch all 4 extremities Coordination: Finger-to- nose-finger intact bilaterally,Heel-to-shin intact bilaterally Gait: normal-based   IMPRESSION: 40 year old female with a history of HTN who presents for evaluation of daily headaches. She does not endorse ipsilateral autonomic symptoms, though severity, duration, and frequency of pain could be consistent with cluster headache. They could alternately represent occipital neuralgia as she did have some improvement on carbamazepine. She does not have occipital tenderness on today's exam. Will continue carbamazepine for now as she feels this has helped her headaches. Offered steroid taper and occipital nerve block to help break headache cycle, but she declined as she feels the pain is manageable at this time. 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 Will check for stroke risk factors. Would avoid triptans while undergoing this workup. Diclofenac prescribed for migraine rescue.  PLAN: -Lipid panel, A1c -TTE -Continue carbamazepine 100 mg BID for now -Diclofenac 50-100 mg as needed for migraines -Next steps: Consider prednisone taper, occipital nerve block if headaches return. Consider Topamax, TCA, or Emgality for headache prevention  I spent a total of 58 minutes chart reviewing and counseling the patient. Headache education was done. Discussed treatment options including preventive and acute medications, natural supplements, and physical therapy. Discussed medication overuse headache  and to limit use of acute treatments to no more than 2 days/week or 10 days/month. Discussed medication side effects, adverse reactions and drug interactions. Written educational materials and patient instructions outlining all of the above were given.  Follow-up: 1 year or sooner if headaches worsen  Genia Harold, MD 10/22/2020   12:54 PM

## 2020-10-23 LAB — HEMOGLOBIN A1C
Est. average glucose Bld gHb Est-mCnc: 123 mg/dL
Hgb A1c MFr Bld: 5.9 % — ABNORMAL HIGH (ref 4.8–5.6)

## 2020-10-23 LAB — LIPID PANEL
Chol/HDL Ratio: 5.3 ratio — ABNORMAL HIGH (ref 0.0–4.4)
Cholesterol, Total: 239 mg/dL — ABNORMAL HIGH (ref 100–199)
HDL: 45 mg/dL (ref >39)
LDL Chol Calc (NIH): 172 mg/dL — ABNORMAL HIGH (ref 0–99)
Triglycerides: 124 mg/dL (ref 0–149)
VLDL Cholesterol Cal: 22 mg/dL (ref 5–40)

## 2020-10-26 ENCOUNTER — Other Ambulatory Visit: Payer: Self-pay | Admitting: Psychiatry

## 2020-10-26 ENCOUNTER — Telehealth: Payer: Self-pay | Admitting: *Deleted

## 2020-10-26 MED ORDER — ATORVASTATIN CALCIUM 80 MG PO TABS: 80.0000 mg | ORAL_TABLET | Freq: Every day | ORAL | 2 refills | Status: DC

## 2020-10-26 NOTE — Telephone Encounter (Signed)
Called patient and informed her per D Chima, her cholesterol level is high. she'd like her to start a daily cholesterol medication called lipitor to help reduce her risk of heart disease and stroke. Dr Delena Bali sent in a prescription to her pharmacy.  Her A1c is also in the pre-diabetic range. She can talk to her family doctor about ways to keep her blood sugar controlled so she doesn't end up developing diabetes. Patient stated she would call her PCP, verbalized understanding, appreciation.

## 2020-10-29 ENCOUNTER — Telehealth: Payer: Self-pay | Admitting: Internal Medicine

## 2020-10-29 ENCOUNTER — Ambulatory Visit: Payer: BC Managed Care – PPO | Admitting: Internal Medicine

## 2020-10-29 NOTE — Telephone Encounter (Signed)
Patient says neurologist is suggesting she go on Lipitor  Patient says after reading up on the side effects she does not want to take the medication  Wants to know what Dr. Jonny Ruiz suggest or if there is a similar rx w/ minimum side effects  Please call patient: [phone]

## 2020-11-02 ENCOUNTER — Encounter: Payer: Self-pay | Admitting: Nurse Practitioner

## 2020-11-02 ENCOUNTER — Other Ambulatory Visit: Payer: Self-pay

## 2020-11-02 ENCOUNTER — Telehealth: Payer: Self-pay | Admitting: Nurse Practitioner

## 2020-11-02 ENCOUNTER — Telehealth (INDEPENDENT_AMBULATORY_CARE_PROVIDER_SITE_OTHER): Payer: BC Managed Care – PPO | Admitting: Nurse Practitioner

## 2020-11-02 VITALS — BP 118/75 | HR 95 | Temp 99.1°F

## 2020-11-02 DIAGNOSIS — J069 Acute upper respiratory infection, unspecified: Secondary | ICD-10-CM | POA: Diagnosis not present

## 2020-11-02 DIAGNOSIS — R051 Acute cough: Secondary | ICD-10-CM

## 2020-11-02 MED ORDER — DOXYCYCLINE HYCLATE 100 MG PO TABS
100.0000 mg | ORAL_TABLET | Freq: Two times a day (BID) | ORAL | 0 refills | Status: AC
Start: 2020-11-02 — End: 2020-11-09

## 2020-11-02 MED ORDER — BENZONATATE 200 MG PO CAPS: 200.0000 mg | ORAL_CAPSULE | Freq: Two times a day (BID) | ORAL | 0 refills | Status: DC | PRN

## 2020-11-02 MED ORDER — GUAIFENESIN-CODEINE 100-10 MG/5ML PO SOLN
5.0000 mL | Freq: Every evening | ORAL | 0 refills | Status: DC | PRN
Start: 2020-11-02 — End: 2020-11-12

## 2020-11-02 NOTE — Telephone Encounter (Signed)
Mr Dimino, patient's husband, called back and was advised of below. Ok per [employer] on file. Advised if patient has any other feedback to call us back or mychart message.

## 2020-11-02 NOTE — Progress Notes (Signed)
Patient ID: Kristie Nelson, female    DOB: Apr 17, 1980, 40 y.o.   MRN: [id number]  Virtual visit completed through [place], a video enabled telemedicine application. Due to national recommendations of social distancing due to COVID-19, a virtual visit is felt to be most appropriate for this patient at this time. Reviewed limitations, risks, security and privacy concerns of performing a virtual visit and the availability of in person appointments. I also reviewed that there may be a patient responsible charge related to this service. The patient agreed to proceed.   Patient location: home Provider location: [place] [facility], office Persons participating in this virtual visit: patient, provider   If any vitals were documented, they were collected by patient at home unless specified below.    BP 118/75 Comment: on 11/01/20  Pulse 95 Comment: 11/01/20  Temp 99.1 F (37.3 C)    CC: Productive cough Subjective:   HPI: Kristie Nelson is a 40 y.o. female presenting on 11/02/2020 for Sore Throat (Started on 10/28/20-runny nose, fever-100.3, cough-started coughing up mucus with blood this morning, sneezing, headache. Covid test neg 10/30/20 and today 11/02/20-negative)  Symptoms on 10/28/2020 Test for covid on today and was negative Pfizer vaccines and one booster  Mucinex has helped some Symptoms have stayed the same     Relevant past medical, surgical, family and social history reviewed and updated as indicated. Interim medical history since our last visit reviewed. Allergies and medications reviewed and updated. Outpatient Medications Prior to Visit  Medication Sig Dispense Refill   amLODipine (NORVASC) 2.5 MG tablet Take 1 tablet (2.5 mg total) by mouth daily. 90 tablet 3   atorvastatin (LIPITOR) 80 MG tablet Take 1 tablet (80 mg total) by mouth daily. 30 tablet 2   azelastine (OPTIVAR) 0.05 % ophthalmic solution Place 1 drop into both eyes 2 (two) times daily. 6 mL 12    carbamazepine (TEGRETOL XR) 100 MG 12 hr tablet Take 1 tablet (100 mg total) by mouth 2 (two) times daily. 60 tablet 5   cetirizine (ZYRTEC) 10 MG tablet Take 1 tablet (10 mg total) by mouth daily. (Patient taking differently: Take 10 mg by mouth as needed.) 30 tablet 11   cyclobenzaprine (FLEXERIL) 5 MG tablet Take 1 tablet (5 mg total) by mouth 3 (three) times daily as needed. for muscle spams 60 tablet 2   diclofenac (CATAFLAM) 50 MG tablet Take 50-100 mg as needed for migraines 30 tablet 2   diltiazem 2 % GEL Apply 1 application topically 3 (three) times daily. 30 g 3   Epinastine HCl 0.05 % ophthalmic solution Place 1 drop into both eyes 2 (two) times daily. 10 mL 12   HYDROcodone-acetaminophen (NORCO/VICODIN) 5-325 MG tablet Take 1 tablet by mouth every 6 (six) hours as needed for moderate pain. 30 tablet 0   Lidocaine-Hydrocortisone Ace 3-2.5 % KIT Use as directed every 12 hrs as needed 1 each 0   metoprolol succinate (TOPROL XL) 25 MG 24 hr tablet Take 1 tablet (25 mg total) by mouth daily. 90 tablet 3   mometasone (ELOCON) 0.1 % ointment Apply topically daily. 45 g 0   pimecrolimus (ELIDEL) 1 % cream Apply topically 2 (two) times daily. 60 g 4   spironolactone (ALDACTONE) 50 MG tablet Take 50 mg by mouth daily.     triamcinolone (NASACORT AQ) 55 MCG/ACT AERO nasal inhaler Place 2 sprays into the nose daily. 1 Inhaler 12   triamcinolone cream (KENALOG) 0.1 % Apply 1 application topically  2 (two) times daily. 45 g 2   AMBULATORY NON FORMULARY MEDICATION Diltiazem 2% mixed with Lidocaine 5%, ratio 1:1  Apply to rectum 2-3 times a day 30 g 2   hydrALAZINE (APRESOLINE) 25 MG tablet 1 tab by mouth three times daily as needed for SBP > 160 90 tablet 11   Olopatadine HCl (PAZEO) 0.7 % SOLN Place 1 drop into both eyes daily. 2.5 mL 5   potassium chloride (KLOR-CON) 10 MEQ tablet Take 2 tablets (20 mEq total) by mouth daily. 180 tablet 3   traMADol (ULTRAM) 50 MG tablet Take 1 tablet (50 mg total)  by mouth every 6 (six) hours as needed. 30 tablet 0   No facility-administered medications prior to visit.     Per HPI unless specifically indicated in ROS section below Review of Systems  Constitutional:  Positive for fatigue. Negative for chills and fever.  HENT:  Positive for congestion. Negative for ear discharge, ear pain, postnasal drip, sinus pressure and sore throat.   Respiratory:  Positive for cough (yellowish brown. SOme blood). Negative for shortness of breath.   Cardiovascular:  Negative for chest pain.  Gastrointestinal:  Negative for abdominal pain, diarrhea, nausea and vomiting.  Musculoskeletal:  Negative for arthralgias and myalgias.  Neurological:  Positive for headaches.  Objective:  BP 118/75 Comment: on 11/01/20  Pulse 95 Comment: 11/01/20  Temp 99.1 F (37.3 C)   Wt Readings from Last 3 Encounters:  10/22/20 157 lb (71.2 kg)  09/24/20 153 lb (69.4 kg)  09/15/20 157 lb (71.2 kg)       Physical exam: Gen: alert, NAD, not ill appearing Pulm: speaks in complete sentences without increased work of breathing Psych: normal mood, normal thought content      Results for orders placed or performed in visit on 10/22/20  Hemoglobin A1c  Result Value Ref Range   Hgb A1c MFr Bld 5.9 (H) 4.8 - 5.6 %   Est. average glucose Bld gHb Est-mCnc 123 mg/dL  Lipid Panel  Result Value Ref Range   Cholesterol, Total 239 (H) 100 - 199 mg/dL   Triglycerides 124 0 - 149 mg/dL   HDL 45 >39 mg/dL   VLDL Cholesterol Cal 22 5 - 40 mg/dL   LDL Chol Calc (NIH) 172 (H) 0 - 99 mg/dL   Chol/HDL Ratio 5.3 (H) 0.0 - 4.4 ratio   Assessment & Plan:   Problem List Items Addressed This Visit       Respiratory   Upper respiratory tract infection - Primary    Given patient's symptoms at that time and presentation will elect to go ahead and treat patient.  Did discuss this with patient and she is in agreement.  We will get a go with a Z-Pak for patient's QTC is slightly elongated and is  on carbamazepine.  We will put on doxycycline instead.  Continue to monitor signs and symptoms reviewed as when she needs to seek emergent or urgent health care.      Relevant Medications   doxycycline (VIBRA-TABS) 100 MG tablet     Other   Acute cough    Send a medication to help with cough.  Start Tessalon Perles and codeine-guaifenesin as needed cough.      Relevant Medications   benzonatate (TESSALON) 200 MG capsule   guaiFENesin-codeine 100-10 MG/5ML syrup     No orders of the defined types were placed in this encounter.  No orders of the defined types were placed in this encounter.  I discussed the assessment and treatment plan with the patient. The patient was provided an opportunity to ask questions and all were answered. The patient agreed with the plan and demonstrated an understanding of the instructions. The patient was advised to call back or seek an in-person evaluation if the symptoms worsen or if the condition fails to improve as anticipated.  Follow up plan: No follow-ups on file.  Romilda Garret, NP

## 2020-11-02 NOTE — Telephone Encounter (Signed)
Matt called patient and left message, also mychart message sent to [facility] to reply

## 2020-11-02 NOTE — Telephone Encounter (Signed)
We did discuss and then decided against it. We will try the antibiotics and the cough medications first and see how she does with those

## 2020-11-02 NOTE — Assessment & Plan Note (Signed)
Send a medication to help with cough.  Start Tessalon Perles and codeine-guaifenesin as needed cough.

## 2020-11-02 NOTE — Assessment & Plan Note (Signed)
Given patient's symptoms at that time and presentation will elect to go ahead and treat patient.  Did discuss this with patient and she is in agreement.  We will get a go with a Z-Pak for patient's QTC is slightly elongated and is on carbamazepine.  We will put on doxycycline instead.  Continue to monitor signs and symptoms reviewed as when she needs to seek emergent or urgent health care.

## 2020-11-02 NOTE — Telephone Encounter (Signed)
Pt husband called stating that Kristie Nelson discussed putting pt on prednisone. Pt husband states that the pharmacy states that no medication was called in. [facility] on [address]

## 2020-11-02 NOTE — Telephone Encounter (Signed)
Please review. I saw antibiotic and medications for cough only

## 2020-11-04 ENCOUNTER — Telehealth: Payer: Self-pay | Admitting: Internal Medicine

## 2020-11-04 ENCOUNTER — Other Ambulatory Visit: Payer: Self-pay

## 2020-11-04 ENCOUNTER — Telehealth (INDEPENDENT_AMBULATORY_CARE_PROVIDER_SITE_OTHER): Payer: BC Managed Care – PPO | Admitting: Family Medicine

## 2020-11-04 ENCOUNTER — Encounter: Payer: Self-pay | Admitting: Family Medicine

## 2020-11-04 VITALS — Ht 64.0 in

## 2020-11-04 DIAGNOSIS — J208 Acute bronchitis due to other specified organisms: Secondary | ICD-10-CM | POA: Diagnosis not present

## 2020-11-04 MED ORDER — HYDROCODONE BIT-HOMATROP MBR 5-1.5 MG/5ML PO SOLN: 5.0000 mL | Freq: Four times a day (QID) | ORAL | 0 refills | Status: DC | PRN

## 2020-11-04 MED ORDER — AZITHROMYCIN 250 MG PO TABS: ORAL_TABLET | ORAL | 0 refills | Status: AC

## 2020-11-04 MED ORDER — PREDNISONE 20 MG PO TABS: ORAL_TABLET | ORAL | 0 refills | Status: DC

## 2020-11-04 NOTE — Telephone Encounter (Signed)
Notified patient via voicemail. 

## 2020-11-04 NOTE — Telephone Encounter (Signed)
Ok done erx for [employer] - [facility]

## 2020-11-04 NOTE — Addendum Note (Signed)
Addended by: Damita Lack on: 11/04/2020 04:14 PM   Modules accepted: Orders

## 2020-11-04 NOTE — Progress Notes (Signed)
Kristie Heeney T. [name], MD Primary Care and Sports Medicine [facility] at [facility] [address] [place], [address] Phone: [phone]  FAX: [phone]  Kristie Nelson - 40 y.o. female  MRN [id number]  Date of Birth: 25-May-1980  Visit Date: 11/04/2020  PCP: Corwin Levins, MD  Referred by: Corwin Levins, MD  Virtual Visit via Video Note:  I connected with  Kristie Nelson on 11/04/2020 11:00 AM EDT by a video enabled telemedicine application and verified that I am speaking with the correct person using two identifiers.   Location patient: home computer, tablet, or smartphone Location provider: work or home office Consent: Verbal consent directly obtained from [place]. Persons participating in the virtual visit: patient, provider  I discussed the limitations of evaluation and management by telemedicine and the availability of in person appointments. The patient expressed understanding and agreed to proceed.  Chief Complaint  Patient presents with   Cough    History of Present Illness:  She did a video visit with Mr. Toney Reil on 11/02/2020.  Cough is going on and it s pretty bad.  Has some cheratussin, but it did not really feel well on it. Tessalon did not really help  Fever and sore throat.  This is gone, and it is feeling better. She did have multiple COVID test, the most recent 2 days ago.  These have all been negative.  Cough.  This is bothering her all day long, she is visibly coughing on the video visit.  This is worse at nighttime. Started a week ago.   Doxy does make some nausea -this is lasting for 3 to 4 hours after dosing.  Nausea has limited her p.o. intake somewhat. BM and urine ok    Review of Systems as above: See pertinent positives and pertinent negatives per HPI No acute distress verbally   Observations/Objective/Exam:  An attempt was made to discern vital signs over the phone and per patient if applicable and  possible.   General:    Alert, Oriented, appears well and in no acute distress  Pulmonary:     On inspection no signs of respiratory distress.  Psych / Neurological:     Pleasant and cooperative.  Assessment and Plan:    ICD-10-CM   1. Acute bronchitis due to other specified organisms  J20.8      Bronchitis, viral versus bacterial.  Seen through video only, which limits ability to exam.  Given nausea, changed to Zithromax. Prednisone, understanding that Tegretol will lower the effective prednisone dosing, which should make little difference in this case.  Hopefully this will open her up and reduce bronchospasm.  I discussed the assessment and treatment plan with the patient. The patient was provided an opportunity to ask questions and all were answered. The patient agreed with the plan and demonstrated an understanding of the instructions.   The patient was advised to call back or seek an in-person evaluation if the symptoms worsen or if the condition fails to improve as anticipated.  Follow-up: prn unless noted otherwise below No follow-ups on file.  Meds ordered this encounter  Medications   azithromycin (ZITHROMAX) 250 MG tablet    Sig: Take 2 tablets (500 mg total) by mouth daily for 1 day, THEN 1 tablet (250 mg total) daily for 4 days.    Dispense:  6 tablet    Refill:  0   predniSONE (DELTASONE) 20 MG tablet    Sig: 2  tabs po for 5 days, then 1 tab po for 5 days    Dispense:  12 tablet    Refill:  0   No orders of the defined types were placed in this encounter.   Signed,  Elpidio Galea. Kaulder Zahner, MD

## 2020-11-04 NOTE — Telephone Encounter (Signed)
Patient had a mychart visit w/ Dr. Toney Reil on 11-02-2020  Patient states the cough syrup she was prescribed is not helping w/ the cough  Patient is requesting another rx for cough

## 2020-11-12 ENCOUNTER — Encounter: Payer: Self-pay | Admitting: Internal Medicine

## 2020-11-12 ENCOUNTER — Other Ambulatory Visit: Payer: Self-pay

## 2020-11-12 ENCOUNTER — Ambulatory Visit: Payer: BC Managed Care – PPO | Admitting: Internal Medicine

## 2020-11-12 VITALS — BP 120/78 | HR 75 | Temp 99.0°F | Ht 64.0 in | Wt 154.0 lb

## 2020-11-12 DIAGNOSIS — B349 Viral infection, unspecified: Secondary | ICD-10-CM | POA: Diagnosis not present

## 2020-11-12 DIAGNOSIS — I700 Atherosclerosis of aorta: Secondary | ICD-10-CM

## 2020-11-12 DIAGNOSIS — R7303 Prediabetes: Secondary | ICD-10-CM

## 2020-11-12 DIAGNOSIS — I10 Essential (primary) hypertension: Secondary | ICD-10-CM

## 2020-11-12 DIAGNOSIS — E538 Deficiency of other specified B group vitamins: Secondary | ICD-10-CM

## 2020-11-12 DIAGNOSIS — E7800 Pure hypercholesterolemia, unspecified: Secondary | ICD-10-CM

## 2020-11-12 DIAGNOSIS — E559 Vitamin D deficiency, unspecified: Secondary | ICD-10-CM

## 2020-11-12 MED ORDER — ROSUVASTATIN CALCIUM 10 MG PO TABS: 10.0000 mg | ORAL_TABLET | Freq: Every day | ORAL | 3 refills | Status: DC

## 2020-11-12 NOTE — Patient Instructions (Signed)
Please take all new medication as prescribed - the crestor 10 mg per day (generic)  Please continue all other medications as before, and refills have been done if requested.  Please have the pharmacy call with any other refills you may need.  Please continue your efforts at being more active, low cholesterol diet, and weight control.  Please keep your appointments with your specialists as you may have planned  Please make an Appointment to return in 4 months, or sooner if needed, also with Labs done a few days ahead at the [facility]

## 2020-11-12 NOTE — Progress Notes (Signed)
Patient ID: Kristie Nelson, female   DOB: 1980-06-15, 40 y.o.   MRN: [id number]        Chief Complaint: follow up uri symptoms, hypergycemia, hld       HPI:  Kristie Nelson is a 40 y.o. female here with 2-3 days acute onset fever, facial pain, pressure, headache, general weakness and malaise, and clear d/c, with mild ST and cough, but pt denies chest pain, wheezing, increased sob or doe, orthopnea, PND, increased LE swelling, palpitations, dizziness or syncope.   Pt denies polydipsia, polyuria, or new focal neuro s/s.  Trying to follow low chol diet, ok for start statin.   Pt denies fever, wt loss, night sweats, loss of appetite, or other constitutional symptoms         Wt Readings from Last 3 Encounters:  11/12/20 154 lb (69.9 kg)  10/22/20 157 lb (71.2 kg)  09/24/20 153 lb (69.4 kg)   BP Readings from Last 3 Encounters:  11/12/20 120/78  11/02/20 118/75  10/22/20 136/85         Past Medical History:  Diagnosis Date   ALLERGIC RHINITIS 04/03/2007   Anal fissure    ANXIETY 04/03/2007   DEPRESSION 04/03/2007   Dysrhythmia    palpitations due to stress   ECZEMA 03/19/2009   Heartburn in pregnancy    Hemorrhoids    HYPERLIPIDEMIA 12/09/2009   HYPERTENSION 04/03/2007   MIGRAINE, COMMON 12/09/2009   Postoperative anemia due to acute blood loss 08/30/2012   Shortness of breath    with anxiety   Past Surgical History:  Procedure Laterality Date   CESAREAN SECTION  2010   CESAREAN SECTION N/A 08/29/2012   Procedure: Repeat CESAREAN SECTION;  Surgeon: Lovenia Kim, MD;  Location: [facility];  Service: Obstetrics;  Laterality: N/A;  EDD: 09/17/12;REQUEST DEE;[name]    reports that she has never smoked. She has never used smokeless tobacco. She reports that she does not drink alcohol and does not use drugs. family history includes Diabetes in her brother, father, maternal grandmother, and mother; Heart disease in her mother; Hyperlipidemia in her maternal grandmother and mother; Hypertension in  her brother, father, maternal grandfather, maternal grandmother, and mother; Liver cancer in her maternal grandfather; Thyroid disease in her maternal grandmother. Allergies  Allergen Reactions   Dog Epithelium Itching   Latex Itching    swelling   Peanut-Containing Drug Products     REACTION: throat swelling   Penicillins     REACTION: itching   Tomato (Diagnostic) Itching and Swelling   Current Outpatient Medications on File Prior to Visit  Medication Sig Dispense Refill   amLODipine (NORVASC) 2.5 MG tablet Take 1 tablet (2.5 mg total) by mouth daily. 90 tablet 3   atorvastatin (LIPITOR) 80 MG tablet Take 1 tablet (80 mg total) by mouth daily. 30 tablet 2   azelastine (OPTIVAR) 0.05 % ophthalmic solution Place 1 drop into both eyes 2 (two) times daily. 6 mL 12   carbamazepine (TEGRETOL XR) 100 MG 12 hr tablet Take 1 tablet (100 mg total) by mouth 2 (two) times daily. 60 tablet 5   cetirizine (ZYRTEC) 10 MG tablet Take 1 tablet (10 mg total) by mouth daily. (Patient taking differently: Take 10 mg by mouth as needed.) 30 tablet 11   cyclobenzaprine (FLEXERIL) 5 MG tablet Take 1 tablet (5 mg total) by mouth 3 (three) times daily as needed. for muscle spams 60 tablet 2   diclofenac (CATAFLAM) 50 MG tablet Take 50-100 mg as needed  for migraines 30 tablet 2   diltiazem 2 % GEL Apply 1 application topically 3 (three) times daily. 30 g 3   Epinastine HCl 0.05 % ophthalmic solution Place 1 drop into both eyes 2 (two) times daily. 10 mL 12   Lidocaine-Hydrocortisone Ace 3-2.5 % KIT Use as directed every 12 hrs as needed 1 each 0   metoprolol succinate (TOPROL XL) 25 MG 24 hr tablet Take 1 tablet (25 mg total) by mouth daily. 90 tablet 3   mometasone (ELOCON) 0.1 % ointment Apply topically daily. 45 g 0   pimecrolimus (ELIDEL) 1 % cream Apply topically 2 (two) times daily. 60 g 4   predniSONE (DELTASONE) 20 MG tablet 2 tabs po for 5 days, then 1 tab po for 5 days 15 tablet 0   spironolactone  (ALDACTONE) 50 MG tablet Take 50 mg by mouth daily.     triamcinolone (NASACORT AQ) 55 MCG/ACT AERO nasal inhaler Place 2 sprays into the nose daily. 1 Inhaler 12   triamcinolone cream (KENALOG) 0.1 % Apply 1 application topically 2 (two) times daily. 45 g 2   No current facility-administered medications on file prior to visit.        ROS:  All others reviewed and negative.  Objective        PE:  BP 120/78 (BP Location: Right Arm, Patient Position: Sitting, Cuff Size: Large)   Pulse 75   Temp 99 F (37.2 C) (Oral)   Ht '5\' 4"'  (1.626 m)   Wt 154 lb (69.9 kg)   SpO2 100%   BMI 26.43 kg/m                 Constitutional: Pt appears in NAD               HENT: Head: NCAT.                Right Ear: External ear normal.                 Left Ear: External ear normal. Bilat tm's with mild erythema.  Max sinus areas non tender.  Pharynx with mild erythema, no exudate               Eyes: . Pupils are equal, round, and reactive to light. Conjunctivae and EOM are normal               Nose: without d/c or deformity               Neck: Neck supple. Gross normal ROM               Cardiovascular: Normal rate and regular rhythm.                 Pulmonary/Chest: Effort normal and breath sounds without rales or wheezing.                Abd:  Soft, NT, ND, + BS, no organomegaly               Neurological: Pt is alert. At baseline orientation, motor grossly intact               Skin: Skin is warm. No rashes, no other new lesions, LE edema - none               Psychiatric: Pt behavior is normal without agitation   Micro: none  Cardiac tracings I have personally interpreted today:  none  Pertinent Radiological findings (summarize):  none   Lab Results  Component Value Date   WBC 7.6 09/15/2020   HGB 13.0 09/15/2020   HCT 39.2 09/15/2020   PLT 325 09/15/2020   GLUCOSE 94 09/15/2020   CHOL 239 (H) 10/22/2020   TRIG 124 10/22/2020   HDL 45 10/22/2020   LDLDIRECT 127.0 07/11/2017   LDLCALC 172  (H) 10/22/2020   ALT 13 09/15/2020   AST 18 09/15/2020   NA 136 09/15/2020   K 3.5 09/15/2020   CL 104 09/15/2020   CREATININE 0.80 09/15/2020   BUN 17 09/15/2020   CO2 24 09/15/2020   TSH 0.71 03/17/2020   HGBA1C 5.9 (H) 10/22/2020   Assessment/Plan:  Kristie Nelson is a 40 y.o. Black or African American [2] female with  has a past medical history of ALLERGIC RHINITIS (04/03/2007), Anal fissure, ANXIETY (04/03/2007), DEPRESSION (04/03/2007), Dysrhythmia, ECZEMA (03/19/2009), Heartburn in pregnancy, Hemorrhoids, HYPERLIPIDEMIA (12/09/2009), HYPERTENSION (04/03/2007), MIGRAINE, COMMON (12/09/2009), Postoperative anemia due to acute blood loss (08/30/2012), and Shortness of breath.  Viral illness Mild, for supportive care, tylenol prn, to f/u any worsening symptoms or concerns  Hyperlipidemia Lab Results  Component Value Date   LDLCALC 172 (H) 10/22/2020   Severe with FH heart disease, pt to start crestor 10,  to f/u any worsening symptoms or concerns   Aortic atherosclerosis ([place]) For start statin, exercise, low chol diet  Essential hypertension BP Readings from Last 3 Encounters:  11/12/20 120/78  11/02/20 118/75  10/22/20 136/85   Stable, pt to continue medical treatment amlodipine   Prediabetes Lab Results  Component Value Date   HGBA1C 5.9 (H) 10/22/2020   Stable, pt to continue current medical treatment  - diet  Followup: Return in about 4 months (around 03/12/2021).  Cathlean Cower, MD 11/15/2020 2:23 PM [facility]

## 2020-11-15 ENCOUNTER — Encounter: Payer: Self-pay | Admitting: Internal Medicine

## 2020-11-15 DIAGNOSIS — R7303 Prediabetes: Secondary | ICD-10-CM | POA: Insufficient documentation

## 2020-11-15 NOTE — Assessment & Plan Note (Addendum)
Lab Results  Component Value Date   LDLCALC 172 (H) 10/22/2020   Severe with FH heart disease, pt to start crestor 10,  to f/u any worsening symptoms or concerns

## 2020-11-15 NOTE — Assessment & Plan Note (Signed)
BP Readings from Last 3 Encounters:  11/12/20 120/78  11/02/20 118/75  10/22/20 136/85   Stable, pt to continue medical treatment amlodipine

## 2020-11-15 NOTE — Assessment & Plan Note (Signed)
For start statin, exercise, low chol diet

## 2020-11-15 NOTE — Assessment & Plan Note (Signed)
Lab Results  Component Value Date   HGBA1C 5.9 (H) 10/22/2020   Stable, pt to continue current medical treatment  - diet

## 2020-11-15 NOTE — Assessment & Plan Note (Signed)
Mild, for supportive care, tylenol prn, to f/u any worsening symptoms or concerns

## 2020-11-24 ENCOUNTER — Encounter: Payer: Self-pay | Admitting: Internal Medicine

## 2020-11-24 NOTE — Telephone Encounter (Signed)
Spoke with patient and informed her that Dr. Jonny Ruiz won't be back in the office until 11/30/20. Offered patient appointment with another provider in the office, patient declined and states that she will continue to try and treat her cough OTC. Advised patient if symptoms worsened or she develops any new symptoms, to go to the ER or urgent care.

## 2020-12-01 ENCOUNTER — Telehealth: Payer: Self-pay | Admitting: Psychiatry

## 2020-12-01 NOTE — Telephone Encounter (Signed)
[employer] no auth req. Spoke to [place] Ref # [id number]. Patient is scheduled at [facility] for 12/16/20.

## 2020-12-12 IMAGING — DX DG KNEE STANDING AP BILAT
1 series · 1 of 1 positions shown · non-contrast
Comparison: The patient's prior radiographs from [id number] could not be
retrieved for comparison.

CLINICAL DATA: Chronic knee pain

EXAM:
BILATERAL KNEES STANDING - 1 VIEW

[knee ap]
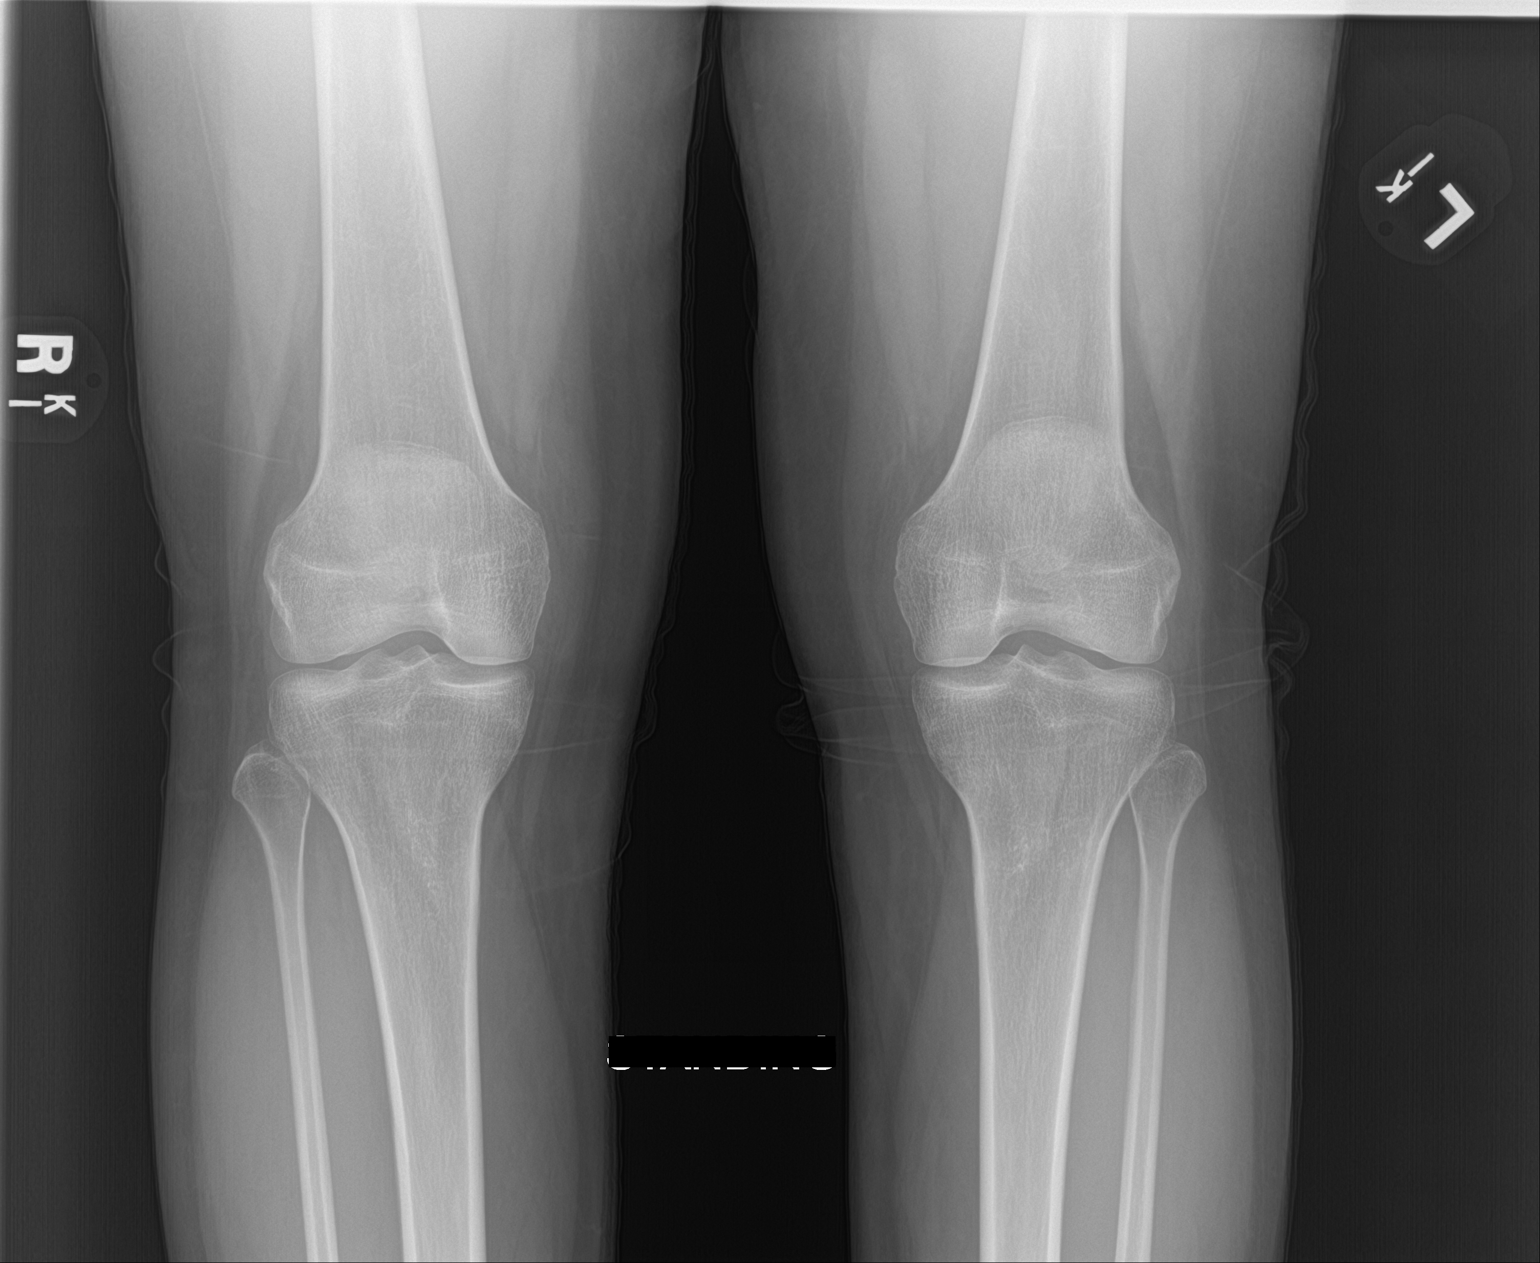

[1 of 1 positions shown; findings below may reference images not displayed]

FINDINGS: There is no acute displaced fracture. No dislocation. There is mild
joint space narrowing involving the medial compartment bilaterally.
IMPRESSION: 1. No acute osseous abnormality.
2. Mild joint space narrowing involving the medial compartment
bilaterally.

## 2020-12-16 ENCOUNTER — Ambulatory Visit (HOSPITAL_COMMUNITY)
Admission: RE | Admit: 2020-12-16 | Discharge: 2020-12-16 | Disposition: A | Discharge status: Home / Self Care | Payer: BC Managed Care – PPO | Source: Ambulatory Visit | Attending: Psychiatry | Admitting: Psychiatry

## 2020-12-16 ENCOUNTER — Other Ambulatory Visit: Payer: Self-pay

## 2020-12-16 DIAGNOSIS — I639 Cerebral infarction, unspecified: Secondary | ICD-10-CM

## 2020-12-16 DIAGNOSIS — I6389 Other cerebral infarction: Secondary | ICD-10-CM

## 2020-12-16 DIAGNOSIS — I10 Essential (primary) hypertension: Secondary | ICD-10-CM | POA: Insufficient documentation

## 2020-12-16 LAB — ECHOCARDIOGRAM COMPLETE BUBBLE STUDY
AR max vel: 2.29 cm2
AV Peak grad: 8.8 mmHg
Ao pk vel: 1.48 m/s
Area-P 1/2: 4.36 cm2
Calc EF: 58.1 %
S' Lateral: 2.8 cm
Single Plane A2C EF: 59.8 %
Single Plane A4C EF: 57.8 %

## 2021-01-11 ENCOUNTER — Encounter: Payer: Self-pay | Admitting: Psychiatry

## 2021-03-17 ENCOUNTER — Other Ambulatory Visit: Payer: Self-pay | Admitting: Internal Medicine

## 2021-03-18 ENCOUNTER — Ambulatory Visit: Payer: BC Managed Care – PPO | Admitting: Internal Medicine

## 2021-05-10 ENCOUNTER — Telehealth: Payer: Self-pay | Admitting: Internal Medicine

## 2021-05-10 NOTE — Telephone Encounter (Signed)
Okay for change. Pharmacy notified.  ?

## 2021-05-10 NOTE — Telephone Encounter (Signed)
Kristie Nelson from [facility] pharmacy called to let us know that they are changing manufacturers for Carbamazepine and they need an ok to change patient to the generic. Please call them back at [phone]. ?

## 2021-06-22 LAB — HM MAMMOGRAPHY

## 2021-07-17 ENCOUNTER — Other Ambulatory Visit: Payer: Self-pay | Admitting: Internal Medicine

## 2021-07-17 NOTE — Telephone Encounter (Signed)
Please refill as per office routine med refill policy (all routine meds to be refilled for 3 mo or monthly (per pt preference) up to one year from last visit, then month to month grace period for 3 mo, then further med refills will have to be denied) ? ?

## 2021-07-24 ENCOUNTER — Emergency Department (HOSPITAL_COMMUNITY)
Admission: EM | Admit: 2021-07-24 | Discharge: 2021-07-25 | Disposition: A | Discharge status: Home / Self Care | Payer: BC Managed Care – PPO | Attending: Emergency Medicine | Admitting: Emergency Medicine

## 2021-07-24 DIAGNOSIS — I10 Essential (primary) hypertension: Secondary | ICD-10-CM | POA: Insufficient documentation

## 2021-07-24 DIAGNOSIS — Z91040 Latex allergy status: Secondary | ICD-10-CM | POA: Diagnosis not present

## 2021-07-24 DIAGNOSIS — Z7982 Long term (current) use of aspirin: Secondary | ICD-10-CM | POA: Diagnosis not present

## 2021-07-24 DIAGNOSIS — E876 Hypokalemia: Secondary | ICD-10-CM | POA: Insufficient documentation

## 2021-07-24 DIAGNOSIS — Z79899 Other long term (current) drug therapy: Secondary | ICD-10-CM | POA: Insufficient documentation

## 2021-07-24 DIAGNOSIS — Z91010 Allergy to peanuts: Secondary | ICD-10-CM | POA: Diagnosis not present

## 2021-07-24 DIAGNOSIS — R079 Chest pain, unspecified: Secondary | ICD-10-CM | POA: Insufficient documentation

## 2021-07-24 NOTE — ED Provider Triage Note (Signed)
Emergency Medicine Provider Triage Evaluation Note  ZAKERIA KULZER , a 41 y.o. female  was evaluated in triage.  Pt complains of chest pain, worse this evening after taking night time meds.  Resolution of pain with EMS after 1 SL NTG and 324 ASA.  Hypertensive with EMS.  Review of Systems  Positive: Chest pain Negative: fever  Physical Exam  There were no vitals taken for this visit.  Gen:   Awake, no distress   Resp:  Normal effort  MSK:   Moves extremities without difficulty  Other:    Medical Decision Making  Medically screening exam initiated at 11:59 PM.  Appropriate orders placed.  DELILAH MULGREW was informed that the remainder of the evaluation will be completed by another provider, this initial triage assessment does not replace that evaluation, and the importance of remaining in the ED until their evaluation is complete.  Chest pain.  EKG, labs, CXR.

## 2021-07-25 ENCOUNTER — Encounter (HOSPITAL_COMMUNITY): Payer: Self-pay | Admitting: Emergency Medicine

## 2021-07-25 ENCOUNTER — Other Ambulatory Visit: Payer: Self-pay

## 2021-07-25 ENCOUNTER — Emergency Department (HOSPITAL_COMMUNITY): Payer: BC Managed Care – PPO

## 2021-07-25 LAB — CBC WITH DIFFERENTIAL/PLATELET
Abs Immature Granulocytes: 0.01 10*3/uL (ref 0.00–0.07)
Basophils Absolute: 0.1 10*3/uL (ref 0.0–0.1)
Basophils Relative: 1 %
Eosinophils Absolute: 0.1 10*3/uL (ref 0.0–0.5)
Eosinophils Relative: 2 %
HCT: 35.6 % — ABNORMAL LOW (ref 36.0–46.0)
Hemoglobin: 12.3 g/dL (ref 12.0–15.0)
Immature Granulocytes: 0 %
Lymphocytes Relative: 49 %
Lymphs Abs: 2.4 10*3/uL (ref 0.7–4.0)
MCH: 31.8 pg (ref 26.0–34.0)
MCHC: 34.6 g/dL (ref 30.0–36.0)
MCV: 92 fL (ref 80.0–100.0)
Monocytes Absolute: 0.3 10*3/uL (ref 0.1–1.0)
Monocytes Relative: 6 %
Neutro Abs: 2.1 10*3/uL (ref 1.7–7.7)
Neutrophils Relative %: 42 %
Platelets: 237 10*3/uL (ref 150–400)
RBC: 3.87 MIL/uL (ref 3.87–5.11)
RDW: 11.9 % (ref 11.5–15.5)
WBC: 5 10*3/uL (ref 4.0–10.5)
nRBC: 0 % (ref 0.0–0.2)

## 2021-07-25 LAB — I-STAT BETA HCG BLOOD, ED (MC, WL, AP ONLY): I-stat hCG, quantitative: <5 m[IU]/mL (ref <5)

## 2021-07-25 LAB — COMPREHENSIVE METABOLIC PANEL
ALT: 20 U/L (ref 0–44)
AST: 20 U/L (ref 15–41)
Albumin: 4.3 g/dL (ref 3.5–5.0)
Alkaline Phosphatase: 53 U/L (ref 38–126)
Anion gap: 9 (ref 5–15)
BUN: 13 mg/dL (ref 6–20)
CO2: 20 mmol/L — ABNORMAL LOW (ref 22–32)
Calcium: 9.6 mg/dL (ref 8.9–10.3)
Chloride: 110 mmol/L (ref 98–111)
Creatinine, Ser: 0.95 mg/dL (ref 0.44–1.00)
GFR, Estimated: >60 mL/min (ref >60)
Glucose, Bld: 118 mg/dL — ABNORMAL HIGH (ref 70–99)
Potassium: 3.1 mmol/L — ABNORMAL LOW (ref 3.5–5.1)
Sodium: 139 mmol/L (ref 135–145)
Total Bilirubin: 0.2 mg/dL — ABNORMAL LOW (ref 0.3–1.2)
Total Protein: 7.5 g/dL (ref 6.5–8.1)

## 2021-07-25 LAB — MAGNESIUM: Magnesium: 2.4 mg/dL (ref 1.7–2.4)

## 2021-07-25 LAB — TROPONIN I (HIGH SENSITIVITY)
Troponin I (High Sensitivity): 3 ng/L (ref <18)
Troponin I (High Sensitivity): 4 ng/L (ref <18)

## 2021-07-25 MED ORDER — POTASSIUM CHLORIDE CRYS ER 20 MEQ PO TBCR
40.0000 meq | EXTENDED_RELEASE_TABLET | Freq: Once | ORAL | Status: AC
  Administered 2021-07-25: 40 meq via ORAL
  Filled 2021-07-25: qty 2

## 2021-07-25 MED ORDER — NITROGLYCERIN 0.4 MG SL SUBL: 0.4000 mg | SUBLINGUAL_TABLET | SUBLINGUAL | 0 refills | Status: DC | PRN

## 2021-07-25 MED ORDER — POTASSIUM CHLORIDE CRYS ER 20 MEQ PO TBCR: 20.0000 meq | EXTENDED_RELEASE_TABLET | Freq: Every day | ORAL | 0 refills | Status: DC

## 2021-07-25 MED ORDER — POTASSIUM CHLORIDE 10 MEQ/100ML IV SOLN
10.0000 meq | Freq: Once | INTRAVENOUS | Status: AC
  Administered 2021-07-25: 10 meq via INTRAVENOUS
  Filled 2021-07-25: qty 100

## 2021-07-25 NOTE — ED Triage Notes (Signed)
Patient arrived with EMS from home reports mid chest pain this evening , no SOB , denies emesis or diaphoresis , she received ASA 324 mg and 1 NTG sl by EMS with relief.

## 2021-07-25 NOTE — ED Provider Notes (Signed)
[facility] EMERGENCY DEPARTMENT Provider Note   CSN: [id number] Arrival date & time: 07/24/21  2356     History  Chief Complaint  Patient presents with   Chest Pain    Kristie Nelson is a 41 y.o. female.   Chest Pain   41 year old female presents emergency department with complaints of chest pain.  Patient states the chest pain occurred around 10 PM last night when she was going to take her nighttime meds.  She says she noticed her blood pressure elevated when she began spearing symptoms with a systolic blood pressure in the 170s. She describes it pain as pressure/dull ache with radiation into her left arm.  She states that the episode lasted for approximately 20 to 30 minutes until EMS arrived.  Upon arrival they administered nitroglycerin as well as aspirin resulted in resolution of majority of patient's pain.  She had associated symptoms including nausea.  She is currently asymptomatic.  She denies associated shortness of breath or positional increase/decrease in pain.  Denies fever, chills, night sweats, shortness of breath, abdominal pain, vomiting. She has a past medical history significant for HLD, HTN, Hypokalemia.  Denies history of smoking, cocaine or other illicit drug use, history of DVT/PE, current hormonal therapy  Home Medications Prior to Admission medications   Medication Sig Start Date End Date Taking? Authorizing Provider  nitroGLYCERIN (NITROSTAT) 0.4 MG SL tablet Place 1 tablet (0.4 mg total) under the tongue every 5 (five) minutes as needed for chest pain. 07/25/21  Yes Wilnette Kales, PA  amLODipine (NORVASC) 2.5 MG tablet Take 1 tablet by mouth once daily 07/19/21   Biagio Borg, MD  atorvastatin (LIPITOR) 80 MG tablet Take 1 tablet (80 mg total) by mouth daily. 10/26/20   Genia Harold, MD  azelastine (OPTIVAR) 0.05 % ophthalmic solution Place 1 drop into both eyes 2 (two) times daily. 02/22/19   Biagio Borg, MD  carbamazepine (TEGRETOL  XR) 100 MG 12 hr tablet Take 1 tablet by mouth twice daily 03/17/21   Biagio Borg, MD  cetirizine (ZYRTEC) 10 MG tablet Take 1 tablet (10 mg total) by mouth daily. Patient taking differently: Take 10 mg by mouth as needed. 01/21/15   Biagio Borg, MD  cyclobenzaprine (FLEXERIL) 5 MG tablet Take 1 tablet (5 mg total) by mouth 3 (three) times daily as needed. for muscle spams 06/30/20   Biagio Borg, MD  diclofenac (CATAFLAM) 50 MG tablet Take 50-100 mg as needed for migraines 10/22/20   Genia Harold, MD  diltiazem 2 % GEL Apply 1 application topically 3 (three) times daily. 02/25/16   Gatha Mayer, MD  Epinastine HCl 0.05 % ophthalmic solution Place 1 drop into both eyes 2 (two) times daily. 09/28/18   Biagio Borg, MD  Lidocaine-Hydrocortisone Ace 3-2.5 % KIT Use as directed every 12 hrs as needed 12/29/16   Biagio Borg, MD  metoprolol succinate (TOPROL XL) 25 MG 24 hr tablet Take 1 tablet (25 mg total) by mouth daily. 04/15/20   Biagio Borg, MD  mometasone (ELOCON) 0.1 % ointment Apply topically daily. 07/19/18   Kennith Gain, MD  pimecrolimus (ELIDEL) 1 % cream Apply topically 2 (two) times daily. 07/19/18   Kennith Gain, MD  predniSONE (DELTASONE) 20 MG tablet 2 tabs po for 5 days, then 1 tab po for 5 days 11/04/20   Copland, Frederico Hamman, MD  rosuvastatin (CRESTOR) 10 MG tablet Take 1 tablet (10 mg total) by  mouth daily. 11/12/20   Biagio Borg, MD  spironolactone (ALDACTONE) 50 MG tablet Take 50 mg by mouth daily.    [provider]  triamcinolone (NASACORT AQ) 55 MCG/ACT AERO nasal inhaler Place 2 sprays into the nose daily. 02/22/19   Biagio Borg, MD  triamcinolone cream (KENALOG) 0.1 % Apply 1 application topically 2 (two) times daily. 03/26/18   Biagio Borg, MD      Allergies    Dog epithelium, Latex, Peanut-containing drug products, Penicillins, and Tomato (diagnostic)    Review of Systems   Review of Systems  Cardiovascular:  Positive for chest  pain.  All other systems reviewed and are negative.   Physical Exam Updated Vital Signs BP 140/86   Pulse 77   Temp 98.3 F (36.8 C) (Oral)   Resp 15   SpO2 100%  Physical Exam Vitals and nursing note reviewed.  Constitutional:      General: She is not in acute distress.    Appearance: She is well-developed. She is not ill-appearing, toxic-appearing or diaphoretic.  HENT:     Head: Normocephalic and atraumatic.  Eyes:     Conjunctiva/sclera: Conjunctivae normal.  Neck:     Vascular: No JVD.  Cardiovascular:     Rate and Rhythm: Normal rate and regular rhythm.     Heart sounds: No murmur heard. Pulmonary:     Effort: Pulmonary effort is normal. No respiratory distress.     Breath sounds: Normal breath sounds. No decreased breath sounds, wheezing, rhonchi or rales.  Chest:     Chest wall: No tenderness.  Abdominal:     Palpations: Abdomen is soft.     Tenderness: There is no abdominal tenderness.  Musculoskeletal:        General: No swelling.     Cervical back: Neck supple.     Right lower leg: No edema.     Left lower leg: No edema.  Skin:    General: Skin is warm and dry.     Capillary Refill: Capillary refill takes less than 2 seconds.  Neurological:     Mental Status: She is alert.  Psychiatric:        Mood and Affect: Mood normal.     ED Results / Procedures / Treatments   Labs (all labs ordered are listed, but only abnormal results are displayed) Labs Reviewed  CBC WITH DIFFERENTIAL/PLATELET - Abnormal; Notable for the following components:      Result Value   HCT 35.6 (*)    All other components within normal limits  COMPREHENSIVE METABOLIC PANEL - Abnormal; Notable for the following components:   Potassium 3.1 (*)    CO2 20 (*)    Glucose, Bld 118 (*)    Total Bilirubin 0.2 (*)    All other components within normal limits  MAGNESIUM  I-STAT BETA HCG BLOOD, ED ([facility], [facility], [facility] ONLY)  TROPONIN I (HIGH SENSITIVITY)  TROPONIN I (HIGH SENSITIVITY)     EKG EKG Interpretation  Date/Time:  Saturday July 24 2021 23:58:28 EDT Ventricular Rate:  91 PR Interval:  162 QRS Duration: 78 QT Interval:  382 QTC Calculation: 469 R Axis:   81 Text Interpretation: Normal sinus rhythm Right atrial enlargement Nonspecific T wave abnormality Prolonged QT Abnormal ECG When compared with ECG of 15-Sep-2020 19:16, PREVIOUS ECG IS PRESENT Confirmed by Gerlene Fee [phone]) on 07/25/2021 6:51:37 AM  Radiology DG Chest 2 View  Result Date: 07/25/2021 CLINICAL DATA:  Chest pain, hypertension. EXAM: CHEST - 2  VIEW COMPARISON:  09/15/2020. FINDINGS: The heart size and mediastinal contours are within normal limits. Both lungs are clear. No acute osseous abnormality. IMPRESSION: No active cardiopulmonary disease. Electronically Signed   By: Brett Fairy M.D.   On: 07/25/2021 00:35    Procedures Procedures    Medications Ordered in ED Medications  potassium chloride SA (KLOR-CON M) CR tablet 40 mEq (40 mEq Oral Given 07/25/21 0731)  potassium chloride 10 mEq in 100 mL IVPB (0 mEq Intravenous Stopped 07/25/21 0840)    ED Course/ Medical Decision Making/ A&P                           Medical Decision Making Amount and/or Complexity of Data Reviewed Labs: ordered.  Risk Prescription drug management.   This patient presents to the ED for concern of chest pain, this involves an extensive number of treatment options, and is a complaint that carries with it a high risk of complications and morbidity.  The differential diagnosis includes The emergent causes of chest pain include: Acute coronary syndrome, tamponade, pericarditis/myocarditis, aortic dissection, pulmonary embolism, tension pneumothorax, pneumonia, and esophageal rupture. Other urgent/non-acute considerations include, but are not limited to: chronic angina, aortic stenosis, cardiomyopathy, mitral valve prolapse, pulmonary hypertension, aortic insufficiency, right ventricular hypertrophy,  pleuritis, bronchitis, pneumothorax, tumor, gastroesophageal reflux disease (GERD), esophageal spasm, Mallory-Weiss syndrome, peptic ulcer disease, pancreatitis, functional gastrointestinal pain, cervical or thoracic disk disease or arthritis, shoulder arthritis, costochondritis, subacromial bursitis, anxiety or panic attack, herpes zoster, breast disorders, chest wall tumors, thoracic outlet syndrome, mediastinitis.   Co morbidities that complicate the patient evaluation  See HPI   Additional history obtained:  Additional history obtained from echocardiogram from 12/16/2020 External records from outside source obtained and reviewed including left ventricular ejection fraction at 60 to 65%   Lab Tests:  I Ordered, and personally interpreted labs.  The pertinent results include: No leukocytosis.  Initial troponin 3 and delta negative.  Potassium 3.1.   Imaging Studies ordered:  I ordered imaging studies including chest x-ray I independently visualized and interpreted imaging which showed no acute cardiopulmonary process I agree with the radiologist interpretation   Cardiac Monitoring: / EKG:  The patient was maintained on a cardiac monitor.  I personally viewed and interpreted the cardiac monitored which showed an underlying rhythm of: Sinus rhythm with nonspecific T wave abnormalities.  Noted mild QT prolongation this been present on previous EKGs   Consultations Obtained:  N/a   Problem List / ED Course / Critical interventions / Medication management  Chest pain I ordered medication including potassium chloride  for hypokalemia Reevaluation of the patient after these medicines showed that the patient improved I have reviewed the patients home medicines and have made adjustments as needed   Social Determinants of Health:  See HPI   Test / Admission - Considered:  Chest pain Vitals signs significant for within normal range and stable throughout visit. Heart  pathway score of 3 given 30 today Mace of 0.9 to 1.7%.  Delta negative troponin with no acute EKG findings.  Doubt ACS.  Wells score for PE of 0 and patient PERC negative; further pursuit of pulmonary embolism work-up deemed unnecessary due to lack of clinical suspicion for PE.  Doubt dissection.  Doubt tension pneumo, doubt pneumonia. Admission was considered but deemed necessary due to lack of concerning features of HPI/PE as well as negative findings on laboratory/imaging studies ordered.  Close follow-up with cardiology outpatient ultrasound recommended secondary  to patient's complaints of chest pain.  I discussed at length with patient regarding concerning chest pain and patient knowledge understanding.  She was given nitroglycerin to take as needed if said symptoms become apparent.  Ambulatory referral to cardiology obtained outpatient. Worrisome signs and symptoms were discussed with the patient, and the patient acknowledged understanding to return to the ED if noticed. Patient was stable upon discharge.         Final Clinical Impression(s) / ED Diagnoses Final diagnoses:  Chest pain, unspecified type  Hypokalemia    Rx / DC Orders ED Discharge Orders          Ordered    potassium chloride SA (KLOR-CON M) 20 MEQ tablet  Daily,   Status:  Discontinued        07/25/21 0724    nitroGLYCERIN (NITROSTAT) 0.4 MG SL tablet  Every 5 min PRN        07/25/21 0724    Ambulatory referral to Cardiology       Comments: If you have not heard from the Cardiology office within the next 72 hours please call [phone].   07/25/21 [place], [place], PA 07/25/21 1100    Godfrey Pick, MD 07/25/21 1204

## 2021-07-25 NOTE — Discharge Instructions (Addendum)
Note that your work-up today was overall reassuring.  There is no evidence today's to suggest that your symptoms earlier were indicative of a heart attack.  However, it is important for you to establish care with a cardiologist outpatient.  I have ordered a referral for cardiology at the [facility]. the information is attached to your discharge papers.  You should receive a call from them within the next 2 to 3 days to set up an appointment for establishment of care. I have also provided nitroglycerin to use at home if you experience concerning symptoms in the future. If you have symptoms again that are relieved by nitroglycerin at home, still seek evaluation by a medical professional.  Note that your potassium was also low in the emergency department.  I have attached information regarding foods that are rich in potassium.  Medication you are on call spironolactone is known as a potassium sparing diuretic she had to be careful with supplementing your potassium orally so as to avoid too much potassium.  Please do not hesitate to return to the emergency department for worrisome signs and symptoms we discussed become apparent.

## 2021-07-29 ENCOUNTER — Ambulatory Visit (INDEPENDENT_AMBULATORY_CARE_PROVIDER_SITE_OTHER): Payer: BC Managed Care – PPO | Admitting: Internal Medicine

## 2021-07-29 VITALS — BP 134/92 | HR 86 | Temp 99.1°F | Ht 64.0 in | Wt 148.5 lb

## 2021-07-29 DIAGNOSIS — I340 Nonrheumatic mitral (valve) insufficiency: Secondary | ICD-10-CM

## 2021-07-29 DIAGNOSIS — R079 Chest pain, unspecified: Secondary | ICD-10-CM

## 2021-07-29 DIAGNOSIS — F411 Generalized anxiety disorder: Secondary | ICD-10-CM | POA: Diagnosis not present

## 2021-07-29 DIAGNOSIS — F32A Depression, unspecified: Secondary | ICD-10-CM

## 2021-07-29 DIAGNOSIS — R7303 Prediabetes: Secondary | ICD-10-CM

## 2021-07-29 MED ORDER — CLONAZEPAM 0.5 MG PO TABS: 0.5000 mg | ORAL_TABLET | Freq: Two times a day (BID) | ORAL | 0 refills | Status: DC | PRN

## 2021-07-29 NOTE — Progress Notes (Signed)
Patient ID: Kristie Nelson, female   DOB: 1980-06-10, 41 y.o.   MRN: [id number]        Chief Complaint: follow up chest pain, mild MR, anxiety panic, depression, hyperglycemia       HPI:  Kristie Nelson is a 41 y.o. female here after seen in ED 7/22 with chest pain, MI ruled out, and has appt with cardiology tomorrow in f/u; has known mild MR by echo dec 2022.  Today - Pt denies chest pain, increased sob or doe, wheezing, orthopnea, PND, increased LE swelling, palpitations, dizziness or syncope.   Pt denies polydipsia, polyuria, or new focal neuro s/s.   Pt has had mild worsening depressive symptoms, but no worsening suicidal ideation, or panic; has ongoing anxiety with intemittent increased panic episodes in the past month.         Wt Readings from Last 3 Encounters:  07/30/21 148 lb 3.2 oz (67.2 kg)  07/29/21 148 lb 8 oz (67.4 kg)  11/12/20 154 lb (69.9 kg)   BP Readings from Last 3 Encounters:  07/30/21 126/72  07/29/21 (!) 134/92  07/25/21 140/86         Past Medical History:  Diagnosis Date   ALLERGIC RHINITIS 04/03/2007   Anal fissure    ANXIETY 04/03/2007   DEPRESSION 04/03/2007   Dysrhythmia    palpitations due to stress   ECZEMA 03/19/2009   Heartburn in pregnancy    Hemorrhoids    HYPERLIPIDEMIA 12/09/2009   HYPERTENSION 04/03/2007   MIGRAINE, COMMON 12/09/2009   Postoperative anemia due to acute blood loss 08/30/2012   Shortness of breath    with anxiety   Past Surgical History:  Procedure Laterality Date   CESAREAN SECTION  2010   CESAREAN SECTION N/A 08/29/2012   Procedure: Repeat CESAREAN SECTION;  Surgeon: Lovenia Kim, MD;  Location: [facility];  Service: Obstetrics;  Laterality: N/A;  EDD: 09/17/12;REQUEST DEE;[name]    reports that she has never smoked. She has never used smokeless tobacco. She reports that she does not drink alcohol and does not use drugs. family history includes Diabetes in her brother, father, maternal grandmother, and mother; Heart disease in  her mother; Hyperlipidemia in her maternal grandmother and mother; Hypertension in her brother, father, maternal grandfather, maternal grandmother, and mother; Liver cancer in her maternal grandfather; Thyroid disease in her maternal grandmother. Allergies  Allergen Reactions   Dog Epithelium Itching   Latex Itching    swelling   Peanut-Containing Drug Products     REACTION: throat swelling   Penicillins     REACTION: itching   Tomato (Diagnostic) Itching and Swelling   Current Outpatient Medications on File Prior to Visit  Medication Sig Dispense Refill   atorvastatin (LIPITOR) 80 MG tablet Take 1 tablet (80 mg total) by mouth daily. 30 tablet 2   azelastine (OPTIVAR) 0.05 % ophthalmic solution Place 1 drop into both eyes 2 (two) times daily. 6 mL 12   carbamazepine (TEGRETOL XR) 100 MG 12 hr tablet Take 1 tablet by mouth twice daily 60 tablet 5   carvedilol (COREG) 12.5 MG tablet Take 12.5 mg by mouth 2 (two) times daily.     cetirizine (ZYRTEC) 10 MG tablet Take 1 tablet (10 mg total) by mouth daily. (Patient taking differently: Take 10 mg by mouth as needed.) 30 tablet 11   cyclobenzaprine (FLEXERIL) 5 MG tablet Take 1 tablet (5 mg total) by mouth 3 (three) times daily as needed. for muscle spams 60 tablet 2  diclofenac (CATAFLAM) 50 MG tablet Take 50-100 mg as needed for migraines 30 tablet 2   diltiazem 2 % GEL Apply 1 application topically 3 (three) times daily. 30 g 3   Epinastine HCl 0.05 % ophthalmic solution Place 1 drop into both eyes 2 (two) times daily. 10 mL 12   Lidocaine-Hydrocortisone Ace 3-2.5 % KIT Use as directed every 12 hrs as needed 1 each 0   mometasone (ELOCON) 0.1 % ointment Apply topically daily. 45 g 0   nitroGLYCERIN (NITROSTAT) 0.4 MG SL tablet Place 1 tablet (0.4 mg total) under the tongue every 5 (five) minutes as needed for chest pain. 30 tablet 0   pimecrolimus (ELIDEL) 1 % cream Apply topically 2 (two) times daily. 60 g 4   potassium chloride SA  (KLOR-CON M) 20 MEQ tablet Take 20 mEq by mouth daily.     rosuvastatin (CRESTOR) 10 MG tablet Take 1 tablet (10 mg total) by mouth daily. 90 tablet 3   spironolactone (ALDACTONE) 50 MG tablet Take 50 mg by mouth daily.     triamcinolone (NASACORT AQ) 55 MCG/ACT AERO nasal inhaler Place 2 sprays into the nose daily. 1 Inhaler 12   triamcinolone cream (KENALOG) 0.1 % Apply 1 application topically 2 (two) times daily. 45 g 2   clonazePAM (KLONOPIN) 0.5 MG tablet Take 1 tablet (0.5 mg total) by mouth 2 (two) times daily as needed for anxiety. 60 tablet 1   No current facility-administered medications on file prior to visit.        ROS:  All others reviewed and negative.  Objective        PE:  BP (!) 134/92 (BP Location: Right Arm, Patient Position: Sitting)   Pulse 86   Temp 99.1 F (37.3 C) (Oral)   Ht '5\' 4"'  (1.626 m)   Wt 148 lb 8 oz (67.4 kg)   SpO2 91%   BMI 25.49 kg/m                 Constitutional: Pt appears in NAD               HENT: Head: NCAT.                Right Ear: External ear normal.                 Left Ear: External ear normal.                Eyes: . Pupils are equal, round, and reactive to light. Conjunctivae and EOM are normal               Nose: without d/c or deformity               Neck: Neck supple. Gross normal ROM               Cardiovascular: Normal rate and regular rhythm.                 Pulmonary/Chest: Effort normal and breath sounds without rales or wheezing.                Abd:  Soft, NT, ND, + BS, no organomegaly               Neurological: Pt is alert. At baseline orientation, motor grossly intact               Skin: Skin is warm. No rashes, no other new lesions, LE edema - none  Psychiatric: Pt behavior is normal without agitation but with anxious depressed affect  Micro: none  Cardiac tracings I have personally interpreted today:  none  Pertinent Radiological findings (summarize): none   Lab Results  Component Value Date    WBC 5.0 07/25/2021   HGB 12.3 07/25/2021   HCT 35.6 (L) 07/25/2021   PLT 237 07/25/2021   GLUCOSE 99 07/30/2021   CHOL 172 07/30/2021   TRIG 69 07/30/2021   HDL 48 07/30/2021   LDLDIRECT 127.0 07/11/2017   LDLCALC 111 (H) 07/30/2021   ALT 20 07/25/2021   AST 20 07/25/2021   NA 138 07/30/2021   K 4.4 07/30/2021   CL 101 07/30/2021   CREATININE 0.95 07/30/2021   BUN 15 07/30/2021   CO2 23 07/30/2021   TSH 1.570 07/30/2021   HGBA1C 5.9 (H) 10/22/2020   Assessment/Plan:  Kristie Nelson is a 41 y.o. Black or African American [2] female with  has a past medical history of ALLERGIC RHINITIS (04/03/2007), Anal fissure, ANXIETY (04/03/2007), DEPRESSION (04/03/2007), Dysrhythmia, ECZEMA (03/19/2009), Heartburn in pregnancy, Hemorrhoids, HYPERLIPIDEMIA (12/09/2009), HYPERTENSION (04/03/2007), MIGRAINE, COMMON (12/09/2009), Postoperative anemia due to acute blood loss (08/30/2012), and Shortness of breath.  Anxiety state With recent increased panic episodes for klonopin 0.5 bid prn  Depression Also mild worsening, no SI or HI, declines lexapro for now, but ok for counseling referral  Chest pain None today, for f/u cardiology tomorrow, etiology unclear  Mild mitral regurgitation by prior echocardiogram Overall stable, continue to monitor,  to f/u any worsening symptoms or concerns  Prediabetes Lab Results  Component Value Date   HGBA1C 5.9 (H) 10/22/2020   Stable, pt to continue current medical treatment  - diet, exercise, wt control  Followup: No follow-ups on file.  Cathlean Cower, MD 07/31/2021 1:38 PM [facility]

## 2021-07-29 NOTE — Patient Instructions (Signed)
Please take all new medication as prescribed- the clonazepam  Please call if you would want the lexapro for depression  Please continue all other medications as before, and refills have been done if requested.  Please have the pharmacy call with any other refills you may need.  Please keep your appointments with your specialists as you may have planned- cardiology tomorrow

## 2021-07-30 ENCOUNTER — Ambulatory Visit: Payer: BC Managed Care – PPO | Admitting: Cardiovascular Disease

## 2021-07-30 ENCOUNTER — Encounter: Payer: Self-pay | Admitting: Cardiovascular Disease

## 2021-07-30 DIAGNOSIS — G44009 Cluster headache syndrome, unspecified, not intractable: Secondary | ICD-10-CM

## 2021-07-30 DIAGNOSIS — Z8249 Family history of ischemic heart disease and other diseases of the circulatory system: Secondary | ICD-10-CM | POA: Diagnosis not present

## 2021-07-30 DIAGNOSIS — R079 Chest pain, unspecified: Secondary | ICD-10-CM

## 2021-07-30 DIAGNOSIS — I10 Essential (primary) hypertension: Secondary | ICD-10-CM | POA: Diagnosis not present

## 2021-07-30 DIAGNOSIS — E7800 Pure hypercholesterolemia, unspecified: Secondary | ICD-10-CM

## 2021-07-30 DIAGNOSIS — E876 Hypokalemia: Secondary | ICD-10-CM

## 2021-07-30 DIAGNOSIS — R0683 Snoring: Secondary | ICD-10-CM | POA: Diagnosis not present

## 2021-07-30 DIAGNOSIS — I700 Atherosclerosis of aorta: Secondary | ICD-10-CM

## 2021-07-30 MED ORDER — METOPROLOL TARTRATE 100 MG PO TABS: ORAL_TABLET | ORAL | 0 refills | Status: DC

## 2021-07-30 MED ORDER — AMLODIPINE BESYLATE 5 MG PO TABS: 5.0000 mg | ORAL_TABLET | Freq: Every day | ORAL | 3 refills | Status: DC

## 2021-07-30 NOTE — Progress Notes (Unsigned)
Cardiology Office Note    Date:  08/03/2021   ID:  JENET DURIO, DOB 06-03-1980, MRN [id number]  PCP:  Biagio Borg, MD  Cardiologist:  Shelva Majestic, MD   New cardiology evaluation referred through the courtesy of Dr. Godfrey Pick following evaluation at [facility].   History of Present Illness:  Kristie Nelson is a 41 y.o. female who admits to a longstanding history of hypertension initially diagnosed at age 78.  She also has a history of hyperlipidemia and has been on lipid-lowering therapy for a year.  She has a history of cluster headaches.  She has a strong family history for premature CAD with her mother suffering a heart attack in her 5s and ultimately died at age 39.  In addition a brother is also suffered myocardial infarction at age 34.  Recently, she began to notice chest pain and on the evening of July 25, 2021 she noted chest pain development approximately 10 PM when she was going to take her nighttime medications.  Her blood pressure was noted to be increased at 170 and with a dull chest ache with left arm radiation lasting 20 to 30 minutes he contacted EMS and was taken to [facility] ER.  Troponins were negative.  Upon presentation her blood pressure was 140/86 with pulse 77. She was on a medical regimen of carvedilol 12.5 mg twice a day, amlodipine 2.5 mg daily, and spironolactone 50 mg daily.  He has been on atorvastatin 80 mg for significant hyperlipidemia with laboratory in October 22, 2020 showing total cholesterol 239, LDL cholesterol 172, triglycerides 124 and HDL 45.  She has taken Klonopin for anxiety.  Troponins were negative.  Magnesium was 2.4.  Hemoglobin 12.3 hematocrit 35.6.  Potassium was 3.1, despite taking spironolactone.  She tells me that she has seen Dr. Joelyn Oms in the past and she has undergone urine studies and will be on to his reevaluation upcoming.  Because of her concerning symptomatology, she was referred for expeditious cardiology evaluation  and presents to my office today for evaluation.  In December 2022 she had an echocardiogram with complete bubble study due to concern for possible CVA accident.  This showed an EF of 60 to 65% without wall motion abnormalities.  There was grade 1 diastolic dysfunction.  She had normal global strain pattern.  There was mild mitral regurgitation.  Agitated saline contrast bubble study was negative with no evidence for interatrial shunting.  She admits to sleeping poorly.  She sleeps only 5 to 7 hours per night and admits to snoring, daytime sleepiness, and nonrestorative sleep.  An Epworth Sleepiness Scale score was calculated in the office today and this endorsed at 12 consistent with excessive daytime sleepiness.   Past Medical History:  Diagnosis Date   ALLERGIC RHINITIS 04/03/2007   Anal fissure    ANXIETY 04/03/2007   DEPRESSION 04/03/2007   Dysrhythmia    palpitations due to stress   ECZEMA 03/19/2009   Heartburn in pregnancy    Hemorrhoids    HYPERLIPIDEMIA 12/09/2009   HYPERTENSION 04/03/2007   MIGRAINE, COMMON 12/09/2009   Postoperative anemia due to acute blood loss 08/30/2012   Shortness of breath    with anxiety    Past Surgical History:  Procedure Laterality Date   CESAREAN SECTION  2010   CESAREAN SECTION N/A 08/29/2012   Procedure: Repeat CESAREAN SECTION;  Surgeon: Lovenia Kim, MD;  Location: [facility];  Service: Obstetrics;  Laterality: N/A;  EDD: 09/17/12;REQUEST  DEE;[name]    Current Medications: Outpatient Medications Prior to Visit  Medication Sig Dispense Refill   atorvastatin (LIPITOR) 80 MG tablet Take 1 tablet (80 mg total) by mouth daily. 30 tablet 2   azelastine (OPTIVAR) 0.05 % ophthalmic solution Place 1 drop into both eyes 2 (two) times daily. 6 mL 12   carbamazepine (TEGRETOL XR) 100 MG 12 hr tablet Take 1 tablet by mouth twice daily 60 tablet 5   carvedilol (COREG) 12.5 MG tablet Take 12.5 mg by mouth 2 (two) times daily.     cetirizine (ZYRTEC) 10 MG  tablet Take 1 tablet (10 mg total) by mouth daily. (Patient taking differently: Take 10 mg by mouth as needed.) 30 tablet 11   clonazePAM (KLONOPIN) 0.5 MG tablet Take 1 tablet (0.5 mg total) by mouth 2 (two) times daily as needed for anxiety. 60 tablet 1   clonazePAM (KLONOPIN) 0.5 MG tablet Take 1 tablet (0.5 mg total) by mouth 2 (two) times daily as needed for anxiety. 60 tablet 0   cyclobenzaprine (FLEXERIL) 5 MG tablet Take 1 tablet (5 mg total) by mouth 3 (three) times daily as needed. for muscle spams 60 tablet 2   diclofenac (CATAFLAM) 50 MG tablet Take 50-100 mg as needed for migraines 30 tablet 2   diltiazem 2 % GEL Apply 1 application topically 3 (three) times daily. 30 g 3   Epinastine HCl 0.05 % ophthalmic solution Place 1 drop into both eyes 2 (two) times daily. 10 mL 12   Lidocaine-Hydrocortisone Ace 3-2.5 % KIT Use as directed every 12 hrs as needed 1 each 0   mometasone (ELOCON) 0.1 % ointment Apply topically daily. 45 g 0   nitroGLYCERIN (NITROSTAT) 0.4 MG SL tablet Place 1 tablet (0.4 mg total) under the tongue every 5 (five) minutes as needed for chest pain. 30 tablet 0   pimecrolimus (ELIDEL) 1 % cream Apply topically 2 (two) times daily. 60 g 4   potassium chloride SA (KLOR-CON M) 20 MEQ tablet Take 20 mEq by mouth daily.     rosuvastatin (CRESTOR) 10 MG tablet Take 1 tablet (10 mg total) by mouth daily. 90 tablet 3   spironolactone (ALDACTONE) 50 MG tablet Take 50 mg by mouth daily.     triamcinolone (NASACORT AQ) 55 MCG/ACT AERO nasal inhaler Place 2 sprays into the nose daily. 1 Inhaler 12   triamcinolone cream (KENALOG) 0.1 % Apply 1 application topically 2 (two) times daily. 45 g 2   amLODipine (NORVASC) 2.5 MG tablet Take 1 tablet by mouth once daily 90 tablet 0   metoprolol succinate (TOPROL XL) 25 MG 24 hr tablet Take 1 tablet (25 mg total) by mouth daily. 90 tablet 3   predniSONE (DELTASONE) 20 MG tablet 2 tabs po for 5 days, then 1 tab po for 5 days 15 tablet 0    No facility-administered medications prior to visit.     Allergies:   Dog epithelium, Latex, Peanut-containing drug products, Penicillins, and Tomato (diagnostic)   Social History   Socioeconomic History   Marital status: Married    Spouse name: Not on file   Number of children: 2   Years of education: Not on file   Highest education level: Not on file  Occupational History   Occupation: TEACHER    Employer: [employer]  Tobacco Use   Smoking status: Never   Smokeless tobacco: Never  Vaping Use   Vaping Use: Never used  Substance and Sexual Activity   Alcohol use:  No    Alcohol/week: 0.0 standard drinks of alcohol   Drug use: No   Sexual activity: Not on file  Other Topics Concern   Not on file  Social History Narrative   Kindergarten teacher - [employer]   Married, 1 son 2010 and 1 daughter 2014   02/25/2016   Social Determinants of Health   Financial Resource Strain: Not on file  Food Insecurity: Not on file  Transportation Needs: Not on file  Physical Activity: Not on file  Stress: Not on file  Social Connections: Not on file    Socially she was born in [place].  She is married for 16 years.  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  She attended [employer].  There is no tobacco use.  There is no illicit drug use.  She does not routinely exercise.   Family History:  The patient's family history includes Diabetes in her brother, father, maternal grandmother, and mother; Heart disease in her mother; Hyperlipidemia in her maternal grandmother and mother; Hypertension in her brother, father, maternal grandfather, maternal grandmother, and mother; Liver cancer in her maternal grandfather; Thyroid disease in her maternal grandmother.   Her mother had a heart attack in her 35s and died at age 45.  Father died in his 90s and had diabetes mellitus.  Her brother had a heart attack in his 35s and has hypertension and diabetes.  Another brother is  12.  ROS General: Negative; No fevers, chills, or night sweats;  HEENT: Negative; No changes in vision or hearing, sinus congestion, difficulty swallowing Pulmonary: Negative; No cough, wheezing, shortness of breath, hemoptysis Cardiovascular: See HPI GI: Negative; No nausea, vomiting, diarrhea, or abdominal pain GU: Negative; No dysuria, hematuria, or difficulty voiding Musculoskeletal: Negative; no myalgias, joint pain, or weakness Hematologic/Oncology: Negative; no easy bruising, bleeding Endocrine: Negative; no heat/cold intolerance; no diabetes Neuro: Negative; no changes in balance, headaches Skin: Negative; No rashes or skin lesions Psychiatric: Negative; No behavioral problems, depression Sleep: Poor sleep with frequent awakenings, nonrestorative sleep, snoring; bruxism, restless legs, hypnogognic hallucinations, no cataplexy Other comprehensive 14 point system review is negative.   PHYSICAL EXAM:   VS:  BP 126/72   Pulse 83   Ht 5' 4" (1.626 m)   Wt 148 lb 3.2 oz (67.2 kg)   SpO2 99%   BMI 25.44 kg/m     Repeat blood pressure by me was 130/78.  Wt Readings from Last 3 Encounters:  07/30/21 148 lb 3.2 oz (67.2 kg)  07/29/21 148 lb 8 oz (67.4 kg)  11/12/20 154 lb (69.9 kg)    General: Alert, oriented, no distress.  Skin: normal turgor, no rashes, warm and dry HEENT: Normocephalic, atraumatic. Pupils equal round and reactive to light; sclera anicteric; extraocular muscles intact;  Nose without nasal septal hypertrophy Mouth/Parynx benign; Mallinpatti scale 3 Neck: No JVD, no carotid bruits; normal carotid upstroke Lungs: clear to ausculatation and percussion; no wheezing or rales Chest wall: without tenderness to palpitation Heart: PMI not displaced, RRR, s1 s2 normal, 1/6 systolic murmur, no diastolic murmur, no rubs, gallops, thrills, or heaves Abdomen: soft, nontender; no hepatosplenomehaly, BS+; abdominal aorta nontender and not dilated by palpation. Back: no  CVA tenderness Pulses 2+ Musculoskeletal: full range of motion, normal strength, no joint deformities Extremities: no clubbing cyanosis or edema, Homan's sign negative  Neurologic: grossly nonfocal; Cranial nerves grossly wnl Psychologic: Normal mood and affect   Studies/Labs Reviewed:   July 30, 2021 ECG (independently read by me): NSR at  83, no STT changes, normal intervals  Recent Labs:    Latest Ref Rng & Units 07/30/2021    9:45 AM 07/25/2021   12:10 AM 09/15/2020    7:06 PM  BMP  Glucose 70 - 99 mg/dL 99  118  94   BUN 6 - 24 mg/dL _0 Creatinine 0.57 - 1.00 mg/dL 0.95  0.95  0.80   BUN/Creat Ratio 9 - 23 16     Sodium 134 - 144 mmol/L 138  139  136   Potassium 3.5 - 5.2 mmol/L 4.4  3.1  3.5   Chloride 96 - 106 mmol/L 101  110  104   CO2 20 - 29 mmol/L _1 Calcium 8.7 - 10.2 mg/dL 9.7  9.6  9.3         Latest Ref Rng & Units 07/25/2021   12:10 AM 09/15/2020    7:06 PM 03/17/2020    3:19 PM  Hepatic Function  Total Protein 6.5 - 8.1 g/dL 7.5  8.1  7.8   Albumin 3.5 - 5.0 g/dL 4.3  4.6  4.4   AST 15 - 41 U/L _2 ALT 0 - 44 U/L _3 Alk Phosphatase 38 - 126 U/L 53  43  39   Total Bilirubin 0.3 - 1.2 mg/dL 0.2  0.3  0.4   Bilirubin, Direct 0.0 - 0.3 mg/dL   0.1        Latest Ref Rng & Units 07/25/2021   12:10 AM 09/15/2020    7:06 PM 03/17/2020    3:19 PM  CBC  WBC 4.0 - 10.5 K/uL 5.0  7.6  9.4   Hemoglobin 12.0 - 15.0 g/dL 12.3  13.0  12.2   Hematocrit 36.0 - 46.0 % 35.6  39.2  36.0   Platelets 150 - 400 K/uL 237  325  285.0    Lab Results  Component Value Date   MCV 92.0 07/25/2021   MCV 94.0 09/15/2020   MCV 92.2 03/17/2020   Lab Results  Component Value Date   TSH 1.570 07/30/2021   Lab Results  Component Value Date   HGBA1C 5.9 (H) 10/22/2020     BNP No results found for: "BNP"  ProBNP No results found for: "PROBNP"   Lipid Panel     Component Value Date/Time   CHOL 172 07/30/2021 0945   TRIG 69  07/30/2021 0945   HDL 48 07/30/2021 0945   CHOLHDL 3.6 07/30/2021 0945   CHOLHDL 5 03/17/2020 1519   VLDL 26.6 03/17/2020 1519   LDLCALC 111 (H) 07/30/2021 0945   LDLDIRECT 127.0 07/11/2017 1603   LABVLDL 13 07/30/2021 0945     RADIOLOGY: DG Chest 2 View  Result Date: 07/25/2021 CLINICAL DATA:  Chest pain, hypertension. EXAM: CHEST - 2 VIEW COMPARISON:  09/15/2020. FINDINGS: The heart size and mediastinal contours are within normal limits. Both lungs are clear. No acute osseous abnormality. IMPRESSION: No active cardiopulmonary disease. Electronically Signed   By: Brett Fairy M.D.   On: 07/25/2021 00:35     Additional studies/ records that were reviewed today include:   Reviewed the records from [facility], ER from July 24, 2021.  Prior echo Doppler study was reviewed.   ASSESSMENT:    1. Chest pain of uncertain etiology   2. Essential hypertension   3. Family history of premature heart disease   4. Snoring  5. Pure hypercholesterolemia   6. Aortic atherosclerosis ([place])   7. Hypokalemia   8. Cluster headache, not intractable, unspecified chronicity pattern     PLAN:  Arlette Lepage is a very pleasant 41 year old African-American female who admits to a history of hypertension dating back since age 15.  She has a history of hyperlipidemia as well as cluster headaches.  She has been on a multidrug regimen for hypertension control consisting of amlodipine 2.5 mg, carvedilol 12.5 mg twice a day, and spironolactone 50 mg daily.  Apparently she has been evaluated by Dr. Joelyn Oms at [facility].  She recently developed episodes of chest pressure with left arm radiation prompting her ER evaluation on July 25, 2021.  Of note, at that time her potassium was 3.1 despite taking spironolactone.  This potentially raises concern for possible hyperaldosteronism but I suspect this may have been previously investigated with studies done by Dr. Joelyn Oms.  In addition, she has a history of  hyperlipidemia Tober 2022 lipid studies revealed total cholesterol 239, LDL cholesterol 172 with triglycerides 124.  She has been on atorvastatin 80 mg daily.  She has a history of premature CAD with her mother having suffered a myocardial infarction in her 26s and also a brother at age 72.  Presently, with her blood pressure lability and recent chest pain I am recommending slight titration of amlodipine from 2.5 mg up to 5 mg.  I am scheduling her for follow-up echo Doppler study.  She recently experienced chest pain with left arm radiation leading to her ER evaluation.  She describes the chest pain as a burning sensation.  I have recommended she undergo coronary CTA to assess calcium score and CAD.  I have recommended follow-up be met, TSH, fasting lipid studies I will check an LP(a) which I discussed with her today is an independent risk factor above and beyond LDL cholesterol.  Her history is also suggestive of possible underlying sleep apnea and I will schedule her for an initial home sleep test.  She tells me she has a follow-up appointment to see Dr. Joelyn Oms at [facility].  I will try to obtain his records to see what evaluation has been done for potential secondary hypertension.  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  I will arrange to see her in 4 weeks for follow-up evaluation or sooner as needed.    Medication Adjustments/Labs and Tests Ordered: Current medicines are reviewed at length with the patient today.  Concerns regarding medicines are outlined above.  Medication changes, Labs and Tests ordered today are listed in the Patient Instructions below. Patient Instructions  Medication Instructions:  Increase Amlodipine 5 mg daily  Take Metoprolol 100 mg two hours before CT scan when scheduled.  *If you need a refill on your cardiac medications before your next appointment, please call your pharmacy*   Lab Work: BMET, LIPID, LPa, TSH today   If you have labs  (blood work) drawn today and your tests are completely normal, you will receive your results only by: [facility] (if you have MyChart) OR A paper copy in the mail If you have any lab test that is abnormal or we need to change your treatment, we will call you to review the results.   Testing/Procedures: Echocardiogram - Your physician has requested that you have an echocardiogram. Echocardiography is a painless test that uses sound waves to create images of your heart. It provides your doctor with information about the size and shape  of your heart and how well your heart's chambers and valves are working. This procedure takes approximately one hour. There are no restrictions for this procedure.   Your physician has recommended that you have a sleep study. This test records several body functions during sleep, including: brain activity, eye movement, oxygen and carbon dioxide blood levels, heart rate and rhythm, breathing rate and rhythm, the flow of air through your mouth and nose, snoring, body muscle movements, and chest and belly movement.  Your physician has requested that you have cardiac CT. Cardiac computed tomography (CT) is a painless test that uses an x-ray machine to take clear, detailed pictures of your heart. For further information please visit [email or web address].[email or web address]. Please follow instruction sheet as given.     Follow-Up: At [facility], you and your health needs are our priority.  As part of our continuing mission to provide you with exceptional heart care, we have created designated Provider Care Teams.  These Care Teams include your primary Cardiologist (physician) and Advanced Practice Providers (APPs -  Physician Assistants and Nurse Practitioners) who all work together to provide you with the care you need, when you need it.  We recommend signing up for the patient portal called "MyChart".  Sign up information is provided on this After Visit Summary.  MyChart is used to  connect with patients for Virtual Visits (Telemedicine).  Patients are able to view lab/test results, encounter notes, upcoming appointments, etc.  Non-urgent messages can be sent to your provider as well.   To learn more about what you can do with MyChart, go to [email or web address].    Your next appointment:   August 25th   The format for your next appointment:   In Person  Provider:   Shelva Majestic, MD  Other Instructions   Your cardiac CT will be scheduled at one of the below locations:   [facility] [address] [place], [place] [address] [phone]  If scheduled at [facility], please arrive at the [facility] and Children's Entrance (Entrance C2) of [facility] 30 minutes prior to test start time. You can use the FREE valet parking offered at entrance C (encouraged to control the heart rate for the test)  Proceed to the [facility] Radiology Department (first floor) to check-in and test prep.  All radiology patients and guests should use entrance C2 at [facility], accessed from [facility], even though the hospital's physical address listed is [address]      Please follow these instructions carefully (unless otherwise directed):  Hold all erectile dysfunction medications at least 3 days (72 hrs) prior to test.  On the Night Before the Test: Be sure to Drink plenty of water. Do not consume any caffeinated/decaffeinated beverages or chocolate 12 hours prior to your test. Do not take any antihistamines 12 hours prior to your test.  On the Day of the Test: Drink plenty of water until 1 hour prior to the test. Do not eat any food 4 hours prior to the test. You may take your regular medications prior to the test.  Take metoprolol (Lopressor) two hours prior to test. HOLD Furosemide/Hydrochlorothiazide morning of the test. FEMALES- please wear underwire-free bra if available, avoid dresses & tight  clothing      After the Test: Drink plenty of water. After receiving IV contrast, you may experience a mild flushed feeling. This is normal. On occasion, you may experience a mild rash up to 24 hours  after the test. This is not dangerous. If this occurs, you can take Benadryl 25 mg and increase your fluid intake. If you experience trouble breathing, this can be serious. If it is severe call 911 IMMEDIATELY. If it is mild, please call our office. If you take any of these medications: Glipizide/Metformin, Avandament, Glucavance, please do not take 48 hours after completing test unless otherwise instructed.  We will call to schedule your test 2-4 weeks out understanding that some insurance companies will need an authorization prior to the service being performed.   For non-scheduling related questions, please contact the cardiac imaging nurse navigator should you have any questions/concerns: Marchia Bond, Cardiac Imaging Nurse Navigator Gordy Clement, Cardiac Imaging Nurse Navigator [facility] Direct Office Dial: [phone]   For scheduling needs, including cancellations and rescheduling, please call [place], [phone].          Signed, Shelva Majestic, MD  08/03/2021 5:35 PM    [facility] [address], [place], [place], [place]  [address] Phone: [phone]

## 2021-07-30 NOTE — Patient Instructions (Addendum)
Medication Instructions:  Increase Amlodipine 5 mg daily  Take Metoprolol 100 mg two hours before CT scan when scheduled.  *If you need a refill on your cardiac medications before your next appointment, please call your pharmacy*   Lab Work: BMET, LIPID, LPa, TSH today   If you have labs (blood work) drawn today and your tests are completely normal, you will receive your results only by: MyChart Message (if you have MyChart) OR A paper copy in the mail If you have any lab test that is abnormal or we need to change your treatment, we will call you to review the results.   Testing/Procedures: Echocardiogram - Your physician has requested that you have an echocardiogram. Echocardiography is a painless test that uses sound waves to create images of your heart. It provides your doctor with information about the size and shape of your heart and how well your heart's chambers and valves are working. This procedure takes approximately one hour. There are no restrictions for this procedure.   Your physician has recommended that you have a sleep study. This test records several body functions during sleep, including: brain activity, eye movement, oxygen and carbon dioxide blood levels, heart rate and rhythm, breathing rate and rhythm, the flow of air through your mouth and nose, snoring, body muscle movements, and chest and belly movement.  Your physician has requested that you have cardiac CT. Cardiac computed tomography (CT) is a painless test that uses an x-ray machine to take clear, detailed pictures of your heart. For further information please visit [email or web address] Please follow instruction sheet as given.     Follow-Up: At [facility], you and your health needs are our priority.  As part of our continuing mission to provide you with exceptional heart care, we have created designated Provider Care Teams.  These Care Teams include your primary Cardiologist (physician) and Advanced  Practice Providers (APPs -  Physician Assistants and Nurse Practitioners) who all work together to provide you with the care you need, when you need it.  We recommend signing up for the patient portal called "MyChart".  Sign up information is provided on this After Visit Summary.  MyChart is used to connect with patients for Virtual Visits (Telemedicine).  Patients are able to view lab/test results, encounter notes, upcoming appointments, etc.  Non-urgent messages can be sent to your provider as well.   To learn more about what you can do with MyChart, go to [email or web address].    Your next appointment:   August 25th   The format for your next appointment:   In Person  Provider:   Nicki Guadalajara, MD  Other Instructions   Your cardiac CT will be scheduled at one of the below locations:   [facility] [address] [place], [place] [address] [phone]  If scheduled at [facility], please arrive at the [facility] and Children's Entrance (Entrance C2) of [facility] 30 minutes prior to test start time. You can use the FREE valet parking offered at entrance C (encouraged to control the heart rate for the test)  Proceed to the [facility] Radiology Department (first floor) to check-in and test prep.  All radiology patients and guests should use entrance C2 at [facility], accessed from [facility], even though the hospital's physical address listed is [address].      Please follow these instructions carefully (unless otherwise directed):  Hold all erectile dysfunction medications at least 3 days (72 hrs)  prior to test.  On the Night Before the Test: Be sure to Drink plenty of water. Do not consume any caffeinated/decaffeinated beverages or chocolate 12 hours prior to your test. Do not take any antihistamines 12 hours prior to your test.  On the Day of the Test: Drink plenty of water until 1 hour prior to the  test. Do not eat any food 4 hours prior to the test. You may take your regular medications prior to the test.  Take metoprolol (Lopressor) two hours prior to test. HOLD Furosemide/Hydrochlorothiazide morning of the test. FEMALES- please wear underwire-free bra if available, avoid dresses & tight clothing      After the Test: Drink plenty of water. After receiving IV contrast, you may experience a mild flushed feeling. This is normal. On occasion, you may experience a mild rash up to 24 hours after the test. This is not dangerous. If this occurs, you can take Benadryl 25 mg and increase your fluid intake. If you experience trouble breathing, this can be serious. If it is severe call 911 IMMEDIATELY. If it is mild, please call our office. If you take any of these medications: Glipizide/Metformin, Avandament, Glucavance, please do not take 48 hours after completing test unless otherwise instructed.  We will call to schedule your test 2-4 weeks out understanding that some insurance companies will need an authorization prior to the service being performed.   For non-scheduling related questions, please contact the cardiac imaging nurse navigator should you have any questions/concerns: Rockwell Alexandria, Cardiac Imaging Nurse Navigator Larey Brick, Cardiac Imaging Nurse Navigator [facility] Direct Office Dial: [phone]   For scheduling needs, including cancellations and rescheduling, please call [place], [phone].

## 2021-07-31 DIAGNOSIS — I340 Nonrheumatic mitral (valve) insufficiency: Secondary | ICD-10-CM | POA: Insufficient documentation

## 2021-07-31 LAB — TSH: TSH: 1.57 u[IU]/mL (ref 0.450–4.500)

## 2021-07-31 LAB — BASIC METABOLIC PANEL
BUN/Creatinine Ratio: 16 (ref 9–23)
BUN: 15 mg/dL (ref 6–24)
CO2: 23 mmol/L (ref 20–29)
Calcium: 9.7 mg/dL (ref 8.7–10.2)
Chloride: 101 mmol/L (ref 96–106)
Creatinine, Ser: 0.95 mg/dL (ref 0.57–1.00)
Glucose: 99 mg/dL (ref 70–99)
Potassium: 4.4 mmol/L (ref 3.5–5.2)
Sodium: 138 mmol/L (ref 134–144)
eGFR: 76 mL/min/{1.73_m2} (ref >59)

## 2021-07-31 LAB — LIPID PANEL
Chol/HDL Ratio: 3.6 ratio (ref 0.0–4.4)
Cholesterol, Total: 172 mg/dL (ref 100–199)
HDL: 48 mg/dL (ref >39)
LDL Chol Calc (NIH): 111 mg/dL — ABNORMAL HIGH (ref 0–99)
Triglycerides: 69 mg/dL (ref 0–149)
VLDL Cholesterol Cal: 13 mg/dL (ref 5–40)

## 2021-07-31 LAB — LIPOPROTEIN A (LPA): Lipoprotein (a): 130.3 nmol/L — ABNORMAL HIGH (ref <75.0)

## 2021-07-31 NOTE — Assessment & Plan Note (Signed)
None today, for f/u cardiology tomorrow, etiology unclear

## 2021-07-31 NOTE — Assessment & Plan Note (Signed)
With recent increased panic episodes for klonopin 0.5 bid prn

## 2021-07-31 NOTE — Assessment & Plan Note (Signed)
Lab Results  Component Value Date   HGBA1C 5.9 (H) 10/22/2020   Stable, pt to continue current medical treatment  - diet, exercise, wt control

## 2021-07-31 NOTE — Assessment & Plan Note (Signed)
Overall stable, continue to monitor,  to f/u any worsening symptoms or concerns

## 2021-07-31 NOTE — Assessment & Plan Note (Signed)
Also mild worsening, no SI or HI, declines lexapro for now, but ok for counseling referral

## 2021-08-03 ENCOUNTER — Ambulatory Visit: Payer: BC Managed Care – PPO | Admitting: Internal Medicine

## 2021-08-03 ENCOUNTER — Encounter: Payer: Self-pay | Admitting: Cardiovascular Disease

## 2021-08-13 ENCOUNTER — Other Ambulatory Visit: Payer: Self-pay

## 2021-08-13 ENCOUNTER — Encounter: Payer: Self-pay | Admitting: Cardiovascular Disease

## 2021-08-13 ENCOUNTER — Ambulatory Visit (HOSPITAL_COMMUNITY): Payer: BC Managed Care – PPO | Attending: Cardiovascular Disease

## 2021-08-13 DIAGNOSIS — R079 Chest pain, unspecified: Secondary | ICD-10-CM | POA: Insufficient documentation

## 2021-08-13 LAB — ECHOCARDIOGRAM COMPLETE
Area-P 1/2: 3.63 cm2
S' Lateral: 3 cm

## 2021-08-13 MED ORDER — EZETIMIBE 10 MG PO TABS: 10.0000 mg | ORAL_TABLET | Freq: Every day | ORAL | 3 refills | Status: DC

## 2021-08-13 NOTE — Telephone Encounter (Signed)
Sent for review and response 

## 2021-08-16 ENCOUNTER — Telehealth (HOSPITAL_COMMUNITY): Payer: Self-pay | Admitting: *Deleted

## 2021-08-16 ENCOUNTER — Encounter (HOSPITAL_COMMUNITY): Payer: Self-pay

## 2021-08-16 NOTE — Telephone Encounter (Signed)
Reaching out to patient to offer assistance regarding upcoming cardiac imaging study; pt verbalizes understanding of appt date/time, parking situation and where to check in, pre-test NPO status and medications ordered, and verified current allergies; name and call back number provided for further questions should they arise  Larey Brick RN Navigator Cardiac Imaging [facility] [phone] office [phone] cell  Patient to take 100mg  metoprolol tartrate two hours prior to her cardiac CT scan. She will hold her morning meds.  She is aware to arrive at 10:30am.

## 2021-08-18 ENCOUNTER — Ambulatory Visit (HOSPITAL_COMMUNITY)
Admission: RE | Admit: 2021-08-18 | Discharge: 2021-08-18 | Disposition: A | Discharge status: Home / Self Care | Payer: BC Managed Care – PPO | Source: Ambulatory Visit | Attending: Cardiovascular Disease | Admitting: Cardiovascular Disease

## 2021-08-18 DIAGNOSIS — R079 Chest pain, unspecified: Secondary | ICD-10-CM

## 2021-08-18 MED ORDER — METOPROLOL TARTRATE 5 MG/5ML IV SOLN: 10.0000 mg | Freq: Once | INTRAVENOUS | Status: AC

## 2021-08-18 MED ORDER — METOPROLOL TARTRATE 5 MG/5ML IV SOLN
10.0000 mg | Freq: Once | INTRAVENOUS | Status: AC
  Administered 2021-08-18: 10 mg via INTRAVENOUS

## 2021-08-18 MED ORDER — METOPROLOL TARTRATE 5 MG/5ML IV SOLN
INTRAVENOUS | Status: AC
  Administered 2021-08-18: 10 mg via INTRAVENOUS
  Filled 2021-08-18: qty 10

## 2021-08-18 MED ORDER — NITROGLYCERIN 0.4 MG SL SUBL
SUBLINGUAL_TABLET | SUBLINGUAL | Status: AC
  Filled 2021-08-18: qty 2

## 2021-08-18 MED ORDER — METOPROLOL TARTRATE 5 MG/5ML IV SOLN
INTRAVENOUS | Status: AC
  Filled 2021-08-18: qty 10

## 2021-08-18 MED ORDER — IOHEXOL 350 MG/ML SOLN
100.0000 mL | Freq: Once | INTRAVENOUS | Status: AC | PRN
  Administered 2021-08-18: 100 mL via INTRAVENOUS

## 2021-08-18 MED ORDER — NITROGLYCERIN 0.4 MG SL SUBL
0.8000 mg | SUBLINGUAL_TABLET | Freq: Once | SUBLINGUAL | Status: AC
  Administered 2021-08-18: 0.8 mg via SUBLINGUAL

## 2021-08-27 ENCOUNTER — Ambulatory Visit: Payer: BC Managed Care – PPO | Admitting: Cardiovascular Disease

## 2021-09-06 ENCOUNTER — Other Ambulatory Visit: Payer: Self-pay | Admitting: Internal Medicine

## 2021-09-07 ENCOUNTER — Ambulatory Visit (HOSPITAL_BASED_OUTPATIENT_CLINIC_OR_DEPARTMENT_OTHER): Payer: BC Managed Care – PPO | Admitting: Cardiovascular Disease

## 2021-09-18 ENCOUNTER — Encounter: Payer: Self-pay | Admitting: Internal Medicine

## 2021-09-20 MED ORDER — ESCITALOPRAM OXALATE 10 MG PO TABS: 10.0000 mg | ORAL_TABLET | Freq: Every day | ORAL | 3 refills | Status: DC

## 2021-09-22 ENCOUNTER — Ambulatory Visit: Payer: BC Managed Care – PPO | Admitting: Physician Assistant

## 2021-09-27 ENCOUNTER — Ambulatory Visit: Payer: BC Managed Care – PPO | Admitting: Behavioral Health

## 2021-10-01 ENCOUNTER — Encounter: Payer: BC Managed Care – PPO | Admitting: Internal Medicine

## 2021-10-11 ENCOUNTER — Ambulatory Visit (INDEPENDENT_AMBULATORY_CARE_PROVIDER_SITE_OTHER): Payer: BC Managed Care – PPO | Admitting: Internal Medicine

## 2021-10-11 ENCOUNTER — Encounter: Payer: Self-pay | Admitting: Internal Medicine

## 2021-10-11 VITALS — BP 120/76 | HR 82 | Temp 98.3°F | Ht 64.0 in | Wt 151.0 lb

## 2021-10-11 DIAGNOSIS — J309 Allergic rhinitis, unspecified: Secondary | ICD-10-CM

## 2021-10-11 DIAGNOSIS — E559 Vitamin D deficiency, unspecified: Secondary | ICD-10-CM

## 2021-10-11 DIAGNOSIS — I700 Atherosclerosis of aorta: Secondary | ICD-10-CM

## 2021-10-11 DIAGNOSIS — F32A Depression, unspecified: Secondary | ICD-10-CM

## 2021-10-11 DIAGNOSIS — E7800 Pure hypercholesterolemia, unspecified: Secondary | ICD-10-CM

## 2021-10-11 DIAGNOSIS — R739 Hyperglycemia, unspecified: Secondary | ICD-10-CM | POA: Diagnosis not present

## 2021-10-11 DIAGNOSIS — E538 Deficiency of other specified B group vitamins: Secondary | ICD-10-CM | POA: Diagnosis not present

## 2021-10-11 DIAGNOSIS — Z0001 Encounter for general adult medical examination with abnormal findings: Secondary | ICD-10-CM

## 2021-10-11 DIAGNOSIS — R7303 Prediabetes: Secondary | ICD-10-CM

## 2021-10-11 DIAGNOSIS — H1010 Acute atopic conjunctivitis, unspecified eye: Secondary | ICD-10-CM | POA: Insufficient documentation

## 2021-10-11 DIAGNOSIS — I10 Essential (primary) hypertension: Secondary | ICD-10-CM

## 2021-10-11 LAB — LIPID PANEL
Cholesterol: 157 mg/dL (ref 0–200)
HDL: 48.6 mg/dL (ref >39.00)
LDL Cholesterol: 91 mg/dL (ref 0–99)
NonHDL: 108.65
Total CHOL/HDL Ratio: 3
Triglycerides: 86 mg/dL (ref 0.0–149.0)
VLDL: 17.2 mg/dL (ref 0.0–40.0)

## 2021-10-11 LAB — HEPATIC FUNCTION PANEL
ALT: 13 U/L (ref 0–35)
AST: 18 U/L (ref 0–37)
Albumin: 4.3 g/dL (ref 3.5–5.2)
Alkaline Phosphatase: 43 U/L (ref 39–117)
Bilirubin, Direct: 0.1 mg/dL (ref 0.0–0.3)
Total Bilirubin: 0.3 mg/dL (ref 0.2–1.2)
Total Protein: 7.1 g/dL (ref 6.0–8.3)

## 2021-10-11 LAB — BASIC METABOLIC PANEL
BUN: 11 mg/dL (ref 6–23)
CO2: 26 mEq/L (ref 19–32)
Calcium: 9.3 mg/dL (ref 8.4–10.5)
Chloride: 103 mEq/L (ref 96–112)
Creatinine, Ser: 0.78 mg/dL (ref 0.40–1.20)
GFR: 92.92 mL/min (ref >60.00)
Glucose, Bld: 89 mg/dL (ref 70–99)
Potassium: 3.8 mEq/L (ref 3.5–5.1)
Sodium: 137 mEq/L (ref 135–145)

## 2021-10-11 LAB — URINALYSIS, ROUTINE W REFLEX MICROSCOPIC
Bilirubin Urine: NEGATIVE
Ketones, ur: NEGATIVE
Leukocytes,Ua: NEGATIVE
Nitrite: NEGATIVE
RBC / HPF: NONE SEEN (ref 0–?)
Specific Gravity, Urine: <=1.005 — AB (ref 1.000–1.030)
Total Protein, Urine: NEGATIVE
Urine Glucose: NEGATIVE
Urobilinogen, UA: 0.2 (ref 0.0–1.0)
pH: 7.5 (ref 5.0–8.0)

## 2021-10-11 LAB — CBC WITH DIFFERENTIAL/PLATELET
Basophils Absolute: 0.1 10*3/uL (ref 0.0–0.1)
Basophils Relative: 1.1 % (ref 0.0–3.0)
Eosinophils Absolute: 0.1 10*3/uL (ref 0.0–0.7)
Eosinophils Relative: 1.6 % (ref 0.0–5.0)
HCT: 34.1 % — ABNORMAL LOW (ref 36.0–46.0)
Hemoglobin: 11.7 g/dL — ABNORMAL LOW (ref 12.0–15.0)
Lymphocytes Relative: 33.5 % (ref 12.0–46.0)
Lymphs Abs: 1.6 10*3/uL (ref 0.7–4.0)
MCHC: 34.2 g/dL (ref 30.0–36.0)
MCV: 95 fl (ref 78.0–100.0)
Monocytes Absolute: 0.3 10*3/uL (ref 0.1–1.0)
Monocytes Relative: 6.3 % (ref 3.0–12.0)
Neutro Abs: 2.8 10*3/uL (ref 1.4–7.7)
Neutrophils Relative %: 57.5 % (ref 43.0–77.0)
Platelets: 257 10*3/uL (ref 150.0–400.0)
RBC: 3.59 Mil/uL — ABNORMAL LOW (ref 3.87–5.11)
RDW: 13.4 % (ref 11.5–15.5)
WBC: 4.8 10*3/uL (ref 4.0–10.5)

## 2021-10-11 LAB — VITAMIN D 25 HYDROXY (VIT D DEFICIENCY, FRACTURES): VITD: 33.07 ng/mL (ref 30.00–100.00)

## 2021-10-11 LAB — HEMOGLOBIN A1C: Hgb A1c MFr Bld: 5.9 % (ref 4.6–6.5)

## 2021-10-11 LAB — TSH: TSH: 0.58 u[IU]/mL (ref 0.35–5.50)

## 2021-10-11 LAB — VITAMIN B12: Vitamin B-12: 212 pg/mL (ref 211–911)

## 2021-10-11 NOTE — Progress Notes (Signed)
Patient ID: Kristie Nelson, female   DOB: [birth year], 41 y.o.   MRN: [id number]         Chief Complaint:: wellness exam and allergies, hld, depression       HPI:  Kristie Nelson is a 41 y.o. female here for wellness exam; declines covid booster and flu shot, o/w up to date                        Also just started lexapro last wk, not sure if working ok yet. Has counseling appt set soon.  Does have several wks ongoing nasal allergy symptoms with clearish congestion, itch and sneezing, without fever, pain, ST, cough, swelling or wheezing. Does not want zyrtec due to risk of sedation.  Trying to follow lower chol diet, not yet started zetia in addition to the crestor 10 mg.  Pt denies chest pain, increased sob or doe, wheezing, orthopnea, PND, increased LE swelling, palpitations, dizziness or syncope.   Pt denies polydipsia, polyuria, or new focal neuro s/s.    Pt denies fever, wt loss, night sweats, loss of appetite, or other constitutional symptoms    Wt Readings from Last 3 Encounters:  10/11/21 151 lb (68.5 kg)  07/30/21 148 lb 3.2 oz (67.2 kg)  07/29/21 148 lb 8 oz (67.4 kg)   BP Readings from Last 3 Encounters:  10/11/21 120/76  08/18/21 134/88  07/30/21 126/72   Immunization History  Administered Date(s) Administered   Influenza Whole 11/16/2007, 11/19/2008   Influenza,inj,Quad PF,6+ Mos 11/14/2012, 10/27/2015   PFIZER Comirnaty(Gray Top)Covid-19 Tri-Sucrose Vaccine 03/16/2019, 04/09/2019, 11/02/2019   Td 01/04/2004   Tdap 08/30/2012   There are no preventive care reminders to display for this patient.     Past Medical History:  Diagnosis Date   ALLERGIC RHINITIS 04/03/2007   Anal fissure    ANXIETY 04/03/2007   DEPRESSION 04/03/2007   Dysrhythmia    palpitations due to stress   ECZEMA 03/19/2009   Heartburn in pregnancy    Hemorrhoids    HYPERLIPIDEMIA 12/09/2009   HYPERTENSION 04/03/2007   MIGRAINE, COMMON 12/09/2009   Postoperative anemia due to acute blood loss 08/30/2012    Shortness of breath    with anxiety   Past Surgical History:  Procedure Laterality Date   CESAREAN SECTION  2010   CESAREAN SECTION N/A 08/29/2012   Procedure: Repeat CESAREAN SECTION;  Surgeon: Lovenia Kim, MD;  Location: [facility];  Service: Obstetrics;  Laterality: N/A;  EDD: 09/17/12;REQUEST DEE;Kristie Nelson    reports that she has never smoked. She has never used smokeless tobacco. She reports that she does not drink alcohol and does not use drugs. family history includes Diabetes in her brother, father, maternal grandmother, and mother; Heart disease in her mother; Hyperlipidemia in her maternal grandmother and mother; Hypertension in her brother, father, maternal grandfather, maternal grandmother, and mother; Liver cancer in her maternal grandfather; Thyroid disease in her maternal grandmother. Allergies  Allergen Reactions   Dog Epithelium Itching   Latex Itching    swelling   Peanut-Containing Drug Products     REACTION: throat swelling   Penicillins     REACTION: itching   Tomato (Diagnostic) Itching and Swelling   Current Outpatient Medications on File Prior to Visit  Medication Sig Dispense Refill   amLODipine (NORVASC) 5 MG tablet Take 1 tablet (5 mg total) by mouth daily. 90 tablet 3   azelastine (OPTIVAR) 0.05 % ophthalmic solution Place 1 drop into both eyes  2 (two) times daily. 6 mL 12   carbamazepine (TEGRETOL XR) 100 MG 12 hr tablet Take 1 tablet by mouth twice daily 180 tablet 1   carvedilol (COREG) 12.5 MG tablet Take 12.5 mg by mouth 2 (two) times daily.     cetirizine (ZYRTEC) 10 MG tablet Take 1 tablet (10 mg total) by mouth daily. (Patient taking differently: Take 10 mg by mouth as needed.) 30 tablet 11   clonazePAM (KLONOPIN) 0.5 MG tablet Take 1 tablet (0.5 mg total) by mouth 2 (two) times daily as needed for anxiety. 60 tablet 1   clonazePAM (KLONOPIN) 0.5 MG tablet Take 1 tablet (0.5 mg total) by mouth 2 (two) times daily as needed for anxiety. 60 tablet 0    cyclobenzaprine (FLEXERIL) 5 MG tablet Take 1 tablet (5 mg total) by mouth 3 (three) times daily as needed. for muscle spams 60 tablet 2   diclofenac (CATAFLAM) 50 MG tablet Take 50-100 mg as needed for migraines 30 tablet 2   diltiazem 2 % GEL Apply 1 application topically 3 (three) times daily. 30 g 3   Epinastine HCl 0.05 % ophthalmic solution Place 1 drop into both eyes 2 (two) times daily. 10 mL 12   escitalopram (LEXAPRO) 10 MG tablet Take 1 tablet (10 mg total) by mouth daily. 90 tablet 3   ezetimibe (ZETIA) 10 MG tablet Take 1 tablet (10 mg total) by mouth daily. 90 tablet 3   Lidocaine-Hydrocortisone Ace 3-2.5 % KIT Use as directed every 12 hrs as needed 1 each 0   metoprolol tartrate (LOPRESSOR) 100 MG tablet Take 1 tablet by mouth once for procedure. 1 tablet 0   mometasone (ELOCON) 0.1 % ointment Apply topically daily. 45 g 0   nitroGLYCERIN (NITROSTAT) 0.4 MG SL tablet Place 1 tablet (0.4 mg total) under the tongue every 5 (five) minutes as needed for chest pain. 30 tablet 0   pimecrolimus (ELIDEL) 1 % cream Apply topically 2 (two) times daily. 60 g 4   potassium chloride SA (KLOR-CON M) 20 MEQ tablet Take 20 mEq by mouth daily.     rosuvastatin (CRESTOR) 10 MG tablet Take 1 tablet (10 mg total) by mouth daily. 90 tablet 3   spironolactone (ALDACTONE) 50 MG tablet Take 50 mg by mouth daily.     triamcinolone (NASACORT AQ) 55 MCG/ACT AERO nasal inhaler Place 2 sprays into the nose daily. 1 Inhaler 12   triamcinolone cream (KENALOG) 0.1 % Apply 1 application topically 2 (two) times daily. 45 g 2   No current facility-administered medications on file prior to visit.        ROS:  All others reviewed and negative.  Objective        PE:  BP 120/76 (BP Location: Right Arm, Patient Position: Sitting, Cuff Size: Normal)   Pulse 82   Temp 98.3 F (36.8 C) (Oral)   Ht _0  (1.626 m)   Wt 151 lb (68.5 kg)   SpO2 100%   BMI 25.92 kg/m                 Constitutional: Pt appears in  NAD               HENT: Head: NCAT.                Right Ear: External ear normal.                 Left Ear: External ear normal.  Eyes: . Pupils are equal, round, and reactive to light. Conjunctivae and EOM are normal               Nose: without d/c or deformity               Neck: Neck supple. Gross normal ROM               Cardiovascular: Normal rate and regular rhythm.                 Pulmonary/Chest: Effort normal and breath sounds without rales or wheezing.                Abd:  Soft, NT, ND, + BS, no organomegaly               Neurological: Pt is alert. At baseline orientation, motor grossly intact               Skin: Skin is warm. No rashes, no other new lesions, LE edema - none               Psychiatric: Pt behavior is normal without agitation   Micro: none  Cardiac tracings I have personally interpreted today:  none  Pertinent Radiological findings (summarize): none   Lab Results  Component Value Date   WBC 4.8 10/11/2021   HGB 11.7 (L) 10/11/2021   HCT 34.1 (L) 10/11/2021   PLT 257.0 10/11/2021   GLUCOSE 89 10/11/2021   CHOL 157 10/11/2021   TRIG 86.0 10/11/2021   HDL 48.60 10/11/2021   LDLDIRECT 127.0 07/11/2017   LDLCALC 91 10/11/2021   ALT 13 10/11/2021   AST 18 10/11/2021   NA 137 10/11/2021   K 3.8 10/11/2021   CL 103 10/11/2021   CREATININE 0.78 10/11/2021   BUN 11 10/11/2021   CO2 26 10/11/2021   TSH 0.58 10/11/2021   HGBA1C 5.9 10/11/2021   Assessment/Plan:  CHENEL WERNLI is a 41 y.o. Black or African American [2] female with  has a past medical history of ALLERGIC RHINITIS (04/03/2007), Anal fissure, ANXIETY (04/03/2007), DEPRESSION (04/03/2007), Dysrhythmia, ECZEMA (03/19/2009), Heartburn in pregnancy, Hemorrhoids, HYPERLIPIDEMIA (12/09/2009), HYPERTENSION (04/03/2007), MIGRAINE, COMMON (12/09/2009), Postoperative anemia due to acute blood loss (08/30/2012), and Shortness of breath.  Encounter for well adult exam with abnormal findings Age and  sex appropriate education and counseling updated with regular exercise and diet Referrals for preventative services - none needed Immunizations addressed  - declines covid booster and flu shot Smoking counseling  - none needed Evidence for depression or other mood disorder - none significant Most recent labs reviewed. I have personally reviewed and have noted: 1) the patient's medical and social history 2) The patient's current medications and supplements 3) The patient's height, weight, and BMI have been recorded in the chart   Prediabetes Lab Results  Component Value Date   HGBA1C 5.9 10/11/2021   Stable, pt to continue current medical treatment  -diet , wt control, excercise   Hyperlipidemia Lab Results  Component Value Date   [place] 91 10/11/2021   Uncontrolled, goal ldl < 70, pt to continue current statin crestor 10 mg, and encouraged to start the zetia as previously prescribed   Essential hypertension BP Readings from Last 3 Encounters:  10/11/21 120/76  08/18/21 134/88  07/30/21 126/72   Stable, pt to continue medical treatment norvasc 5 mg qd, coreg 12.5 bid    Depression Stable but symptomatic, not yet improved with lexapro  - for cont'd med for  minimum 3 wks, pt to call if needs increased dose  Aortic atherosclerosis (HCC) Pt to continue crestor 10, add zetia 10 mg qd, cont exercise, low chol diet  Allergic rhinoconjunctivitis Pt not wanting zyrtec due to wary of side effects though has not tried this; now ofr allegra otc 180 mg qd, and add otc nasacort prn as well  Followup: Return in about 1 year (around 10/12/2022).  Cathlean Cower, MD 10/11/2021 2:35 PM [facility]

## 2021-10-11 NOTE — Assessment & Plan Note (Signed)
Stable but symptomatic, not yet improved with lexapro  - for cont'd med for minimum 3 wks, pt to call if needs increased dose

## 2021-10-11 NOTE — Assessment & Plan Note (Signed)
Pt not wanting zyrtec due to wary of side effects though has not tried this; now ofr allegra otc 180 mg qd, and add otc nasacort prn as well

## 2021-10-11 NOTE — Patient Instructions (Addendum)
Please remember to call for your next Dr Claiborne Billings cholesterol appointment.  Ok to take the OTC Allegra and Nasacort for allergies  Please continue all other medications as before, including the lexapro  Please have the pharmacy call with any other refills you may need.  Please continue your efforts at being more active, low cholesterol diet, and weight control.  You are otherwise up to date with prevention measures today.  Please keep your appointments with your specialists as you may have planned - counseling appointment soon  Please go to the LAB at the blood drawing area for the tests to be done  You will be contacted by phone if any changes need to be made immediately.  Otherwise, you will receive a letter about your results with an explanation, but please check with MyChart first.  Please remember to sign up for MyChart if you have not done so, as this will be important to you in the future with finding out test results, communicating by private email, and scheduling acute appointments online when needed.  Please make an Appointment to return for your 1 year visit, or sooner if needed

## 2021-10-11 NOTE — Assessment & Plan Note (Signed)
Age and sex appropriate education and counseling updated with regular exercise and diet Referrals for preventative services - none needed Immunizations addressed - declines covid booster and flu shot Smoking counseling  - none needed Evidence for depression or other mood disorder - none significant Most recent labs reviewed. I have personally reviewed and have noted: 1) the patient's medical and social history 2) The patient's current medications and supplements 3) The patient's height, weight, and BMI have been recorded in the chart  

## 2021-10-11 NOTE — Assessment & Plan Note (Signed)
Pt to continue crestor 10, add zetia 10 mg qd, cont exercise, low chol diet

## 2021-10-11 NOTE — Assessment & Plan Note (Signed)
Lab Results  Component Value Date   LDLCALC 91 10/11/2021   Uncontrolled, goal ldl < 70, pt to continue current statin crestor 10 mg, and encouraged to start the zetia as previously prescribed

## 2021-10-11 NOTE — Assessment & Plan Note (Signed)
BP Readings from Last 3 Encounters:  10/11/21 120/76  08/18/21 134/88  07/30/21 126/72   Stable, pt to continue medical treatment norvasc 5 mg qd, coreg 12.5 bid

## 2021-10-11 NOTE — Assessment & Plan Note (Signed)
Lab Results  Component Value Date   HGBA1C 5.9 10/11/2021   Stable, pt to continue current medical treatment  -diet , wt control, excercise

## 2021-10-20 ENCOUNTER — Ambulatory Visit (INDEPENDENT_AMBULATORY_CARE_PROVIDER_SITE_OTHER): Payer: BC Managed Care – PPO | Admitting: Behavioral Health

## 2021-10-20 DIAGNOSIS — F418 Other specified anxiety disorders: Secondary | ICD-10-CM

## 2021-10-20 DIAGNOSIS — Z87898 Personal history of other specified conditions: Secondary | ICD-10-CM | POA: Diagnosis not present

## 2021-10-21 NOTE — Progress Notes (Signed)
                Marionna Gonia L Strother Everitt, LMFT 
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
## 2021-10-28 ENCOUNTER — Telehealth (INDEPENDENT_AMBULATORY_CARE_PROVIDER_SITE_OTHER): Payer: BC Managed Care – PPO | Admitting: Family Medicine

## 2021-10-28 ENCOUNTER — Encounter: Payer: Self-pay | Admitting: Family Medicine

## 2021-10-28 DIAGNOSIS — U071 COVID-19: Secondary | ICD-10-CM | POA: Diagnosis not present

## 2021-10-28 MED ORDER — MOLNUPIRAVIR 200 MG PO CAPS: 4.0000 | ORAL_CAPSULE | Freq: Two times a day (BID) | ORAL | 0 refills | Status: DC

## 2021-10-28 NOTE — Progress Notes (Deleted)
Virtual telephone visit    Virtual Visit via Telephone Note   This visit type was conducted due to national recommendations for restrictions regarding the COVID-19 Pandemic (e.g. social distancing) in an effort to limit this patient's exposure and mitigate transmission in our community. Due to her co-morbid illnesses, this patient is at least at moderate risk for complications without adequate follow up. This format is felt to be most appropriate for this patient at this time. The patient did not have access to video technology or had technical difficulties with video requiring transitioning to audio format only (telephone). Physical exam was limited to content and character of the telephone converstion. CMA was able to get the patient set up on a telephone visit.   Patient location: Home. Patient and provider in visit Provider location: Office  I discussed the limitations of evaluation and management by telemedicine and the availability of in person appointments. The patient expressed understanding and agreed to proceed.   Visit Date: 10/28/2021  Today's healthcare provider: Harland Dingwall, NP-C     Subjective:    Patient ID: Kristie Nelson, female    DOB: [birth year], 41 y.o.   MRN: [id number]  Chief Complaint  Patient presents with   Covid Positive    10/26 Symptoms: sore throat, fever, runny nose, fatigue, nausea, body aches    HPI  Past Medical History:  Diagnosis Date   ALLERGIC RHINITIS 04/03/2007   Anal fissure    ANXIETY 04/03/2007   DEPRESSION 04/03/2007   Dysrhythmia    palpitations due to stress   ECZEMA 03/19/2009   Heartburn in pregnancy    Hemorrhoids    HYPERLIPIDEMIA 12/09/2009   HYPERTENSION 04/03/2007   MIGRAINE, COMMON 12/09/2009   Postoperative anemia due to acute blood loss 08/30/2012   Shortness of breath    with anxiety    Past Surgical History:  Procedure Laterality Date   CESAREAN SECTION  2010   CESAREAN SECTION N/A 08/29/2012   Procedure:  Repeat CESAREAN SECTION;  Surgeon: Lovenia Kim, MD;  Location: [place] [facility];  Service: Obstetrics;  Laterality: N/A;  EDD: 09/17/12;REQUEST DEE;[name]    Family History  Problem Relation Age of Onset   Diabetes Mother    Hypertension Mother    Heart disease Mother    Hyperlipidemia Mother    Diabetes Father    Hypertension Father    Diabetes Brother    Hypertension Brother    Diabetes Maternal Grandmother    Hypertension Maternal Grandmother    Thyroid disease Maternal Grandmother    Hyperlipidemia Maternal Grandmother    Liver cancer Maternal Grandfather        mets   Hypertension Maternal Grandfather    Other Neg Hx        hyperaldosteronism    Social History   Socioeconomic History   Marital status: Married    Spouse name: Not on file   Number of children: 2   Years of education: Not on file   Highest education level: Not on file  Occupational History   Occupation: TEACHER    Employer: [employer]  Tobacco Use   Smoking status: Never   Smokeless tobacco: Never  Vaping Use   Vaping Use: Never used  Substance and Sexual Activity   Alcohol use: No    Alcohol/week: 0.0 standard drinks of alcohol   Drug use: No   Sexual activity: Not on file  Other Topics Concern   Not on file  Social History Narrative   Kindergarten teacher -  [employer]   Married, 1 son 2010 and 1 daughter 2014   02/25/2016   Social Determinants of Health   Financial Resource Strain: Not on file  Food Insecurity: Not on file  Transportation Needs: Not on file  Physical Activity: Not on file  Stress: Not on file  Social Connections: Not on file  Intimate Partner Violence: Not on file    Outpatient Medications Prior to Visit  Medication Sig Dispense Refill   amLODipine (NORVASC) 5 MG tablet Take 1 tablet (5 mg total) by mouth daily. 90 tablet 3   azelastine (OPTIVAR) 0.05 % ophthalmic solution Place 1 drop into both eyes 2 (two) times daily. 6 mL 12   carbamazepine (TEGRETOL  XR) 100 MG 12 hr tablet Take 1 tablet by mouth twice daily 180 tablet 1   carvedilol (COREG) 12.5 MG tablet Take 12.5 mg by mouth 2 (two) times daily.     cetirizine (ZYRTEC) 10 MG tablet Take 1 tablet (10 mg total) by mouth daily. (Patient taking differently: Take 10 mg by mouth as needed.) 30 tablet 11   clonazePAM (KLONOPIN) 0.5 MG tablet Take 1 tablet (0.5 mg total) by mouth 2 (two) times daily as needed for anxiety. 60 tablet 1   clonazePAM (KLONOPIN) 0.5 MG tablet Take 1 tablet (0.5 mg total) by mouth 2 (two) times daily as needed for anxiety. 60 tablet 0   cyclobenzaprine (FLEXERIL) 5 MG tablet Take 1 tablet (5 mg total) by mouth 3 (three) times daily as needed. for muscle spams 60 tablet 2   diclofenac (CATAFLAM) 50 MG tablet Take 50-100 mg as needed for migraines 30 tablet 2   diltiazem 2 % GEL Apply 1 application topically 3 (three) times daily. 30 g 3   Epinastine HCl 0.05 % ophthalmic solution Place 1 drop into both eyes 2 (two) times daily. 10 mL 12   escitalopram (LEXAPRO) 10 MG tablet Take 1 tablet (10 mg total) by mouth daily. 90 tablet 3   ezetimibe (ZETIA) 10 MG tablet Take 1 tablet (10 mg total) by mouth daily. 90 tablet 3   Lidocaine-Hydrocortisone Ace 3-2.5 % KIT Use as directed every 12 hrs as needed 1 each 0   metoprolol tartrate (LOPRESSOR) 100 MG tablet Take 1 tablet by mouth once for procedure. 1 tablet 0   mometasone (ELOCON) 0.1 % ointment Apply topically daily. 45 g 0   nitroGLYCERIN (NITROSTAT) 0.4 MG SL tablet Place 1 tablet (0.4 mg total) under the tongue every 5 (five) minutes as needed for chest pain. 30 tablet 0   pimecrolimus (ELIDEL) 1 % cream Apply topically 2 (two) times daily. 60 g 4   potassium chloride SA (KLOR-CON M) 20 MEQ tablet Take 20 mEq by mouth daily.     rosuvastatin (CRESTOR) 10 MG tablet Take 1 tablet (10 mg total) by mouth daily. 90 tablet 3   spironolactone (ALDACTONE) 50 MG tablet Take 50 mg by mouth daily.     triamcinolone (NASACORT AQ) 55  MCG/ACT AERO nasal inhaler Place 2 sprays into the nose daily. 1 Inhaler 12   triamcinolone cream (KENALOG) 0.1 % Apply 1 application topically 2 (two) times daily. 45 g 2   No facility-administered medications prior to visit.    Allergies  Allergen Reactions   Dog Epithelium Itching   Latex Itching    swelling   Peanut-Containing Drug Products     REACTION: throat swelling   Penicillins     REACTION: itching   Tomato (Diagnostic) Itching and Swelling  ROS     Objective:    Physical Exam  There were no vitals taken for this visit. Wt Readings from Last 3 Encounters:  10/11/21 151 lb (68.5 kg)  07/30/21 148 lb 3.2 oz (67.2 kg)  07/29/21 148 lb 8 oz (67.4 kg)        Assessment & Plan:   Problem List Items Addressed This Visit   None   I am having Nazirah I. Mayorga maintain her clonazePAM, cetirizine, diltiazem, Lidocaine-Hydrocortisone Ace, triamcinolone cream, mometasone, pimecrolimus, Epinastine HCl, spironolactone, triamcinolone, azelastine, cyclobenzaprine, diclofenac, rosuvastatin, nitroGLYCERIN, carvedilol, potassium chloride SA, clonazePAM, amLODipine, metoprolol tartrate, ezetimibe, carbamazepine, and escitalopram.  No orders of the defined types were placed in this encounter.    I discussed the assessment and treatment plan with the patient. The patient was provided an opportunity to ask questions and all were answered. The patient agreed with the plan and demonstrated an understanding of the instructions.   The patient was advised to call back or seek an in-person evaluation if the symptoms worsen or if the condition fails to improve as anticipated.  I provided *** minutes of non-face-to-face time during this encounter.   Harland Dingwall, NP-C [facility] [place] [phone] (phone) [phone] (fax)  [place]

## 2021-10-28 NOTE — Progress Notes (Signed)
MyChart Video Visit    Virtual Visit via Video Note   This visit type was conducted due to national recommendations for restrictions regarding the COVID-19 Pandemic (e.g. social distancing) in an effort to limit this patient's exposure and mitigate transmission in our community. This patient is at least at moderate risk for complications without adequate follow up. This format is felt to be most appropriate for this patient at this time. Physical exam was limited by quality of the video and audio technology used for the visit. CMA was able to get the patient set up on a video visit.  Patient location: Home. Patient and provider in visit Provider location: Office  I discussed the limitations of evaluation and management by telemedicine and the availability of in person appointments. The patient expressed understanding and agreed to proceed.  Visit Date: 10/28/2021  Today's healthcare provider: Harland Dingwall, NP-C     Subjective:    Patient ID: Kristie Nelson, female    DOB: 01-13-1980, 41 y.o.   MRN: [id number]  Chief Complaint  Patient presents with   Covid Positive    10/26 Symptoms: sore throat, fever, runny nose, fatigue, nausea, body aches    HPI  C/o symptom onset 3 days ago with fever, chills, body aches,  sore throat, rhinorrhea, headache, cough, nausea.   Tested positive for Covid today.   Had vaccines for Covid but not booster.   Denies dizziness, chest pain, palpitations, shortness of breath, abdominal pain, N/V/D      Past Medical History:  Diagnosis Date   ALLERGIC RHINITIS 04/03/2007   Anal fissure    ANXIETY 04/03/2007   DEPRESSION 04/03/2007   Dysrhythmia    palpitations due to stress   ECZEMA 03/19/2009   Heartburn in pregnancy    Hemorrhoids    HYPERLIPIDEMIA 12/09/2009   HYPERTENSION 04/03/2007   MIGRAINE, COMMON 12/09/2009   Postoperative anemia due to acute blood loss 08/30/2012   Shortness of breath    with anxiety    Past Surgical  History:  Procedure Laterality Date   CESAREAN SECTION  2010   CESAREAN SECTION N/A 08/29/2012   Procedure: Repeat CESAREAN SECTION;  Surgeon: Lovenia Kim, MD;  Location: [facility];  Service: Obstetrics;  Laterality: N/A;  EDD: 09/17/12;REQUEST DEE;[name]    Family History  Problem Relation Age of Onset   Diabetes Mother    Hypertension Mother    Heart disease Mother    Hyperlipidemia Mother    Diabetes Father    Hypertension Father    Diabetes Brother    Hypertension Brother    Diabetes Maternal Grandmother    Hypertension Maternal Grandmother    Thyroid disease Maternal Grandmother    Hyperlipidemia Maternal Grandmother    Liver cancer Maternal Grandfather        mets   Hypertension Maternal Grandfather    Other Neg Hx        hyperaldosteronism    Social History   Socioeconomic History   Marital status: Married    Spouse name: Not on file   Number of children: 2   Years of education: Not on file   Highest education level: Not on file  Occupational History   Occupation: TEACHER    Employer: [employer]  Tobacco Use   Smoking status: Never   Smokeless tobacco: Never  Vaping Use   Vaping Use: Never used  Substance and Sexual Activity   Alcohol use: No    Alcohol/week: 0.0 standard drinks of alcohol  Drug use: No   Sexual activity: Not on file  Other Topics Concern   Not on file  Social History Narrative   Kindergarten teacher - [employer]   Married, 1 son 2010 and 1 daughter 2014   02/25/2016   Social Determinants of Health   Financial Resource Strain: Not on file  Food Insecurity: Not on file  Transportation Needs: Not on file  Physical Activity: Not on file  Stress: Not on file  Social Connections: Not on file  Intimate Partner Violence: Not on file    Outpatient Medications Prior to Visit  Medication Sig Dispense Refill   amLODipine (NORVASC) 5 MG tablet Take 1 tablet (5 mg total) by mouth daily. 90 tablet 3   azelastine (OPTIVAR)  0.05 % ophthalmic solution Place 1 drop into both eyes 2 (two) times daily. 6 mL 12   carbamazepine (TEGRETOL XR) 100 MG 12 hr tablet Take 1 tablet by mouth twice daily 180 tablet 1   carvedilol (COREG) 12.5 MG tablet Take 12.5 mg by mouth 2 (two) times daily.     cetirizine (ZYRTEC) 10 MG tablet Take 1 tablet (10 mg total) by mouth daily. (Patient taking differently: Take 10 mg by mouth as needed.) 30 tablet 11   clonazePAM (KLONOPIN) 0.5 MG tablet Take 1 tablet (0.5 mg total) by mouth 2 (two) times daily as needed for anxiety. 60 tablet 1   clonazePAM (KLONOPIN) 0.5 MG tablet Take 1 tablet (0.5 mg total) by mouth 2 (two) times daily as needed for anxiety. 60 tablet 0   cyclobenzaprine (FLEXERIL) 5 MG tablet Take 1 tablet (5 mg total) by mouth 3 (three) times daily as needed. for muscle spams 60 tablet 2   diclofenac (CATAFLAM) 50 MG tablet Take 50-100 mg as needed for migraines 30 tablet 2   diltiazem 2 % GEL Apply 1 application topically 3 (three) times daily. 30 g 3   Epinastine HCl 0.05 % ophthalmic solution Place 1 drop into both eyes 2 (two) times daily. 10 mL 12   escitalopram (LEXAPRO) 10 MG tablet Take 1 tablet (10 mg total) by mouth daily. 90 tablet 3   ezetimibe (ZETIA) 10 MG tablet Take 1 tablet (10 mg total) by mouth daily. 90 tablet 3   Lidocaine-Hydrocortisone Ace 3-2.5 % KIT Use as directed every 12 hrs as needed 1 each 0   metoprolol tartrate (LOPRESSOR) 100 MG tablet Take 1 tablet by mouth once for procedure. 1 tablet 0   mometasone (ELOCON) 0.1 % ointment Apply topically daily. 45 g 0   nitroGLYCERIN (NITROSTAT) 0.4 MG SL tablet Place 1 tablet (0.4 mg total) under the tongue every 5 (five) minutes as needed for chest pain. 30 tablet 0   pimecrolimus (ELIDEL) 1 % cream Apply topically 2 (two) times daily. 60 g 4   potassium chloride SA (KLOR-CON M) 20 MEQ tablet Take 20 mEq by mouth daily.     rosuvastatin (CRESTOR) 10 MG tablet Take 1 tablet (10 mg total) by mouth daily. 90  tablet 3   spironolactone (ALDACTONE) 50 MG tablet Take 50 mg by mouth daily.     triamcinolone (NASACORT AQ) 55 MCG/ACT AERO nasal inhaler Place 2 sprays into the nose daily. 1 Inhaler 12   triamcinolone cream (KENALOG) 0.1 % Apply 1 application topically 2 (two) times daily. 45 g 2   No facility-administered medications prior to visit.    Allergies  Allergen Reactions   Dog Epithelium Itching   Latex Itching    swelling  Peanut-Containing Drug Products     REACTION: throat swelling   Penicillins     REACTION: itching   Tomato (Diagnostic) Itching and Swelling    ROS     Objective:    Physical Exam  There were no vitals taken for this visit. Wt Readings from Last 3 Encounters:  10/11/21 151 lb (68.5 kg)  07/30/21 148 lb 3.2 oz (67.2 kg)  07/29/21 148 lb 8 oz (67.4 kg)   A&Ox3, no distress. Respirations unlabored.     Assessment & Plan:   Problem List Items Addressed This Visit   None Visit Diagnoses     COVID-19 virus infection    -  Primary   Relevant Medications   molnupiravir EUA (LAGEVRIO) 200 MG CAPS capsule      Molnupiravir prescribed. Discussed potential side effects.  Counseling on symptomatic management.  Counseling on CDC guidelines for quarantine.  Follow up prn.   I am having Kristie Nelson start on molnupiravir EUA. I am also having her maintain her clonazePAM, cetirizine, diltiazem, Lidocaine-Hydrocortisone Ace, triamcinolone cream, mometasone, pimecrolimus, Epinastine HCl, spironolactone, triamcinolone, azelastine, cyclobenzaprine, diclofenac, rosuvastatin, nitroGLYCERIN, carvedilol, potassium chloride SA, clonazePAM, amLODipine, metoprolol tartrate, ezetimibe, carbamazepine, and escitalopram.  Meds ordered this encounter  Medications   molnupiravir EUA (LAGEVRIO) 200 MG CAPS capsule    Sig: Take 4 capsules (800 mg total) by mouth 2 (two) times daily for 5 days.    Dispense:  40 capsule    Refill:  0    Order Specific Question:    Supervising Provider    Answer:   Pricilla Holm A [[id number]]    I discussed the assessment and treatment plan with the patient. The patient was provided an opportunity to ask questions and all were answered. The patient agreed with the plan and demonstrated an understanding of the instructions.   The patient was advised to call back or seek an in-person evaluation if the symptoms worsen or if the condition fails to improve as anticipated.  I provided 15 minutes of face-to-face time during this encounter.   Harland Dingwall, NP-C [facility] [place] [phone] (phone) [phone] (fax)  [place]

## 2021-10-29 ENCOUNTER — Telehealth: Payer: Self-pay | Admitting: Internal Medicine

## 2021-10-29 DIAGNOSIS — U071 COVID-19: Secondary | ICD-10-CM

## 2021-10-29 MED ORDER — MOLNUPIRAVIR 200 MG PO CAPS: 4.0000 | ORAL_CAPSULE | Freq: Two times a day (BID) | ORAL | 0 refills | Status: AC

## 2021-10-29 NOTE — Telephone Encounter (Signed)
[facility]  [address].  Please send the antiviral med there per pt 2nd call request.

## 2021-10-29 NOTE — Telephone Encounter (Signed)
Rx resent to pharmacy

## 2021-10-29 NOTE — Telephone Encounter (Signed)
Patient called and said that her pharmacy does not carry the anti viral medication that vickie henson sent in yesterday for her covid.  [facility] [address], [place]

## 2021-11-02 ENCOUNTER — Ambulatory Visit: Payer: BC Managed Care – PPO | Admitting: Behavioral Health

## 2021-11-15 ENCOUNTER — Other Ambulatory Visit: Payer: Self-pay

## 2021-11-15 ENCOUNTER — Other Ambulatory Visit: Payer: Self-pay | Admitting: Internal Medicine

## 2021-11-15 NOTE — Telephone Encounter (Signed)
Please refill as per office routine med refill policy (all routine meds to be refilled for 3 mo or monthly (per pt preference) up to one year from last visit, then month to month grace period for 3 mo, then further med refills will have to be denied) ? ?

## 2021-12-22 ENCOUNTER — Ambulatory Visit: Payer: BC Managed Care – PPO | Admitting: Behavioral Health

## 2021-12-22 DIAGNOSIS — Z87898 Personal history of other specified conditions: Secondary | ICD-10-CM | POA: Diagnosis not present

## 2021-12-22 DIAGNOSIS — F418 Other specified anxiety disorders: Secondary | ICD-10-CM | POA: Diagnosis not present

## 2021-12-22 NOTE — Progress Notes (Signed)
                Artice Bergerson L Nerea Bordenave, LMFT 

## 2021-12-22 NOTE — Progress Notes (Unsigned)
[facility] Counselor/Therapist Progress Note  Patient ID: Kristie Nelson, MRN: [id number],    Date: 12/22/2021  Time Spent: 55 min In Person @ [facility] Office   Treatment Type: Individual Therapy  Reported Symptoms: ***  Mental Status Exam: Appearance:  {PSY:22683}     Behavior: {PSY:21022743}  Motor: {PSY:22302}  Speech/Language:  {PSY:22685}  Affect: {PSY:22687}  Mood: {PSY:31886}  Thought process: {PSY:31888}  Thought content:   {PSY:918 408 6082}  Sensory/Perceptual disturbances:   {PSY:458-348-0931}  Orientation: {PSY:30297}  Attention: {PSY:22877}  Concentration: {PSY:8581538985}  Memory: {PSY:404-256-2746}  Fund of knowledge:  {PSY:8581538985}  Insight:   {PSY:8581538985}  Judgment:  {PSY:8581538985}  Impulse Control: {PSY:8581538985}   Risk Assessment: Danger to Self:  {PSY:22692} Self-injurious Behavior: {PSY:22692} Danger to Others: {PSY:22692} Duty to Warn:{PSY:311194} Physical Aggression / Violence:{PSY:21197} Access to Firearms a concern: {PSY:21197} Gang Involvement:{PSY:21197}  Subjective: ***   Interventions: {[phone]}  Diagnosis:No diagnosis found.  Plan: ***  Deneise Lever, LMFT

## 2021-12-31 ENCOUNTER — Telehealth: Payer: Self-pay | Admitting: Internal Medicine

## 2021-12-31 NOTE — Telephone Encounter (Signed)
Caller & Relationship to patient: Self  Call back number: [phone]   Date of last office visit: 10.09.23  Date of next office visit: N/A  Medication(s) to be refilled:  clonazePAM Scarlette Calico) 0.5 MG tablet   Preferred Pharmacy:   [facility] [phone]   Phone: [phone]  Fax: [phone]    Pt states that she would like an increase in her dosage, states they have been seeing a therapist, but is still struggling with ' certain things' (pt did not want to elaborate)

## 2022-01-04 NOTE — Telephone Encounter (Signed)
Patient would like to increase dosage if possible, please advise

## 2022-01-04 NOTE — Telephone Encounter (Signed)
Ok to ask pt to try 2 of what she is taking, and if tolerates ok and sees some improvement, let us know for a change in rx

## 2022-01-05 NOTE — Telephone Encounter (Signed)
Patient will double up on current dose and give [place] an update on improvement

## 2022-01-10 ENCOUNTER — Other Ambulatory Visit: Payer: Self-pay

## 2022-01-10 ENCOUNTER — Encounter (HOSPITAL_BASED_OUTPATIENT_CLINIC_OR_DEPARTMENT_OTHER): Payer: Self-pay

## 2022-01-10 ENCOUNTER — Emergency Department (HOSPITAL_BASED_OUTPATIENT_CLINIC_OR_DEPARTMENT_OTHER)
Admission: EM | Admit: 2022-01-10 | Discharge: 2022-01-10 | Disposition: A | Discharge status: Home / Self Care | Payer: BC Managed Care – PPO | Attending: Emergency Medicine | Admitting: Emergency Medicine

## 2022-01-10 ENCOUNTER — Emergency Department (HOSPITAL_BASED_OUTPATIENT_CLINIC_OR_DEPARTMENT_OTHER): Payer: BC Managed Care – PPO

## 2022-01-10 DIAGNOSIS — R031 Nonspecific low blood-pressure reading: Secondary | ICD-10-CM | POA: Diagnosis not present

## 2022-01-10 DIAGNOSIS — R002 Palpitations: Secondary | ICD-10-CM

## 2022-01-10 DIAGNOSIS — Z91040 Latex allergy status: Secondary | ICD-10-CM | POA: Insufficient documentation

## 2022-01-10 DIAGNOSIS — I10 Essential (primary) hypertension: Secondary | ICD-10-CM | POA: Diagnosis not present

## 2022-01-10 DIAGNOSIS — Z91010 Allergy to peanuts: Secondary | ICD-10-CM | POA: Diagnosis not present

## 2022-01-10 DIAGNOSIS — F419 Anxiety disorder, unspecified: Secondary | ICD-10-CM | POA: Insufficient documentation

## 2022-01-10 DIAGNOSIS — Z79899 Other long term (current) drug therapy: Secondary | ICD-10-CM | POA: Insufficient documentation

## 2022-01-10 LAB — BASIC METABOLIC PANEL
Anion gap: 9 (ref 5–15)
BUN: 19 mg/dL (ref 6–20)
CO2: 25 mmol/L (ref 22–32)
Calcium: 9.8 mg/dL (ref 8.9–10.3)
Chloride: 103 mmol/L (ref 98–111)
Creatinine, Ser: 0.93 mg/dL (ref 0.44–1.00)
GFR, Estimated: >60 mL/min (ref >60)
Glucose, Bld: 100 mg/dL — ABNORMAL HIGH (ref 70–99)
Potassium: 3.8 mmol/L (ref 3.5–5.1)
Sodium: 137 mmol/L (ref 135–145)

## 2022-01-10 LAB — TROPONIN I (HIGH SENSITIVITY): Troponin I (High Sensitivity): <2 ng/L

## 2022-01-10 LAB — CBC
HCT: 37.1 % (ref 36.0–46.0)
Hemoglobin: 12.4 g/dL (ref 12.0–15.0)
MCH: 31.6 pg (ref 26.0–34.0)
MCHC: 33.4 g/dL (ref 30.0–36.0)
MCV: 94.4 fL (ref 80.0–100.0)
Platelets: 251 K/uL (ref 150–400)
RBC: 3.93 MIL/uL (ref 3.87–5.11)
RDW: 12.4 % (ref 11.5–15.5)
WBC: 4.9 K/uL (ref 4.0–10.5)
nRBC: 0 % (ref 0.0–0.2)

## 2022-01-10 NOTE — Discharge Instructions (Addendum)
You were seen in the emergency department today for dizziness, chest tightness, palpitations.  As we discussed I think that your feelings of anxiety could have been made worse by your lower blood pressure readings.  Often times your heart rate will try to increase to improve your blood pressure.  Sometimes when this happens it can feel like palpitations and your heart racing.  I am hesitant to change her anxiety her blood pressure regimens at this time, and I like you to discuss it with your primary doctor.  Your laboratory evaluation, chest x-ray, and EKG all looked reassuring today.  Continue to monitor how you're doing and return to the ER for new or worsening symptoms.

## 2022-01-10 NOTE — ED Provider Notes (Signed)
[facility] Provider Note   CSN: [id number] Arrival date & time: 01/10/22  0932     History  Chief Complaint  Patient presents with   Dizziness   Palpitations    Kristie Nelson is a 42 y.o. female with history of eczema, hyperlipidemia, anxiety, depression, hypertension who presents the emergency department complaining of "not feeling well" starting about 3 days ago.  Patient states that she was at work and started feeling very anxious, with associated lightheadedness and palpitations.  She took her prescribed Klonopin, and did not have much relief from it.  She checked her blood pressure the following day and her readings had systolics in the 46N, which is much lower than her normal.  She laid in bed all day yesterday, checking her blood pressure again, it had not improved.  She feels she has been hydrating well.  She had attributed the palpitations and dizziness to anxiety, was concerned with her lower blood pressure readings and the fact that her Klonopin was not helping very much.  She reports having had some changes to her hypertension regimen in the past several months, as her PCP was having difficulty figuring out what works best for her.   Dizziness Associated symptoms: palpitations   Palpitations Associated symptoms: dizziness        Home Medications Prior to Admission medications   Medication Sig Start Date End Date Taking? Authorizing Provider  amLODipine (NORVASC) 5 MG tablet Take 1 tablet (5 mg total) by mouth daily. 07/30/21   Troy Sine, MD  azelastine (OPTIVAR) 0.05 % ophthalmic solution Place 1 drop into both eyes 2 (two) times daily. 02/22/19   Biagio Borg, MD  carbamazepine (TEGRETOL XR) 100 MG 12 hr tablet Take 1 tablet by mouth twice daily 09/06/21   Biagio Borg, MD  carvedilol (COREG) 12.5 MG tablet Take 12.5 mg by mouth 2 (two) times daily. 06/17/21   [provider]  cetirizine (ZYRTEC) 10 MG tablet Take 1 tablet (10  mg total) by mouth daily. Patient taking differently: Take 10 mg by mouth as needed. 01/21/15   Biagio Borg, MD  clonazePAM (KLONOPIN) 0.5 MG tablet Take 1 tablet (0.5 mg total) by mouth 2 (two) times daily as needed for anxiety. 01/05/11 07/29/22  Biagio Borg, MD  clonazePAM (KLONOPIN) 0.5 MG tablet Take 1 tablet (0.5 mg total) by mouth 2 (two) times daily as needed for anxiety. 07/29/21   Biagio Borg, MD  cyclobenzaprine (FLEXERIL) 5 MG tablet Take 1 tablet (5 mg total) by mouth 3 (three) times daily as needed. for muscle spams 06/30/20   Biagio Borg, MD  diclofenac (CATAFLAM) 50 MG tablet Take 50-100 mg as needed for migraines 10/22/20   Genia Harold, MD  diltiazem 2 % GEL Apply 1 application topically 3 (three) times daily. 02/25/16   Gatha Mayer, MD  Epinastine HCl 0.05 % ophthalmic solution Place 1 drop into both eyes 2 (two) times daily. 09/28/18   Biagio Borg, MD  escitalopram (LEXAPRO) 10 MG tablet Take 1 tablet (10 mg total) by mouth daily. 09/20/21 09/20/22  Biagio Borg, MD  ezetimibe (ZETIA) 10 MG tablet Take 1 tablet (10 mg total) by mouth daily. 08/13/21 08/08/22  Troy Sine, MD  Lidocaine-Hydrocortisone Ace 3-2.5 % KIT Use as directed every 12 hrs as needed 12/29/16   Biagio Borg, MD  metoprolol tartrate (LOPRESSOR) 100 MG tablet Take 1 tablet by mouth once for procedure. 07/30/21  Troy Sine, MD  mometasone (ELOCON) 0.1 % ointment Apply topically daily. 07/19/18   Kennith Gain, MD  nitroGLYCERIN (NITROSTAT) 0.4 MG SL tablet Place 1 tablet (0.4 mg total) under the tongue every 5 (five) minutes as needed for chest pain. 07/25/21   Dion Saucier A, PA  pimecrolimus (ELIDEL) 1 % cream Apply topically 2 (two) times daily. 07/19/18   Kennith Gain, MD  potassium chloride SA (KLOR-CON M) 20 MEQ tablet Take 20 mEq by mouth daily. 07/25/21   [provider]  rosuvastatin (CRESTOR) 10 MG tablet TAKE 1 TABLET BY MOUTH EVERY DAY 11/15/21   Biagio Borg, MD  spironolactone (ALDACTONE) 50 MG tablet Take 50 mg by mouth daily.    [provider]  triamcinolone (NASACORT AQ) 55 MCG/ACT AERO nasal inhaler Place 2 sprays into the nose daily. 02/22/19   Biagio Borg, MD  triamcinolone cream (KENALOG) 0.1 % Apply 1 application topically 2 (two) times daily. 03/26/18   Biagio Borg, MD      Allergies    Dog epithelium, Latex, Peanut-containing drug products, Penicillins, and Tomato (diagnostic)    Review of Systems   Review of Systems  Respiratory:  Positive for chest tightness.   Cardiovascular:  Positive for palpitations.  Neurological:  Positive for dizziness.  Psychiatric/Behavioral:  The patient is nervous/anxious.   All other systems reviewed and are negative.   Physical Exam Updated Vital Signs BP 122/84 (BP Location: Right Arm)   Pulse 81   Temp 98 F (36.7 C) (Oral)   Resp 16   Ht 5\' 3"  (1.6 m)   Wt 64 kg   SpO2 100%   BMI 24.98 kg/m  Physical Exam Vitals and nursing note reviewed.  Constitutional:      Appearance: Normal appearance.  HENT:     Head: Normocephalic and atraumatic.  Eyes:     Conjunctiva/sclera: Conjunctivae normal.  Cardiovascular:     Rate and Rhythm: Normal rate and regular rhythm.  Pulmonary:     Effort: Pulmonary effort is normal. No respiratory distress.     Breath sounds: Normal breath sounds.  Abdominal:     General: There is no distension.     Palpations: Abdomen is soft.     Tenderness: There is no abdominal tenderness.  Skin:    General: Skin is warm and dry.  Neurological:     General: No focal deficit present.     Mental Status: She is alert.     ED Results / Procedures / Treatments   Labs (all labs ordered are listed, but only abnormal results are displayed) Labs Reviewed  BASIC METABOLIC PANEL - Abnormal; Notable for the following components:      Result Value   Glucose, Bld 100 (*)    All other components within normal limits  CBC  PREGNANCY, URINE   TROPONIN I (HIGH SENSITIVITY)  TROPONIN I (HIGH SENSITIVITY)    EKG EKG Interpretation  Date/Time:  Monday January 10 2022 09:53:44 EST Ventricular Rate:  79 PR Interval:  170 QRS Duration: 88 QT Interval:  372 QTC Calculation: 427 R Axis:   83 Text Interpretation: Sinus rhythm Anteroseptal infarct, old Confirmed by Regan Lemming ([id number]) on 01/10/2022 11:27:06 AM  Radiology DG Chest 2 View  Result Date: 01/10/2022 CLINICAL DATA:  palpitations EXAM: CHEST - 2 VIEW COMPARISON:  07/25/2021 FINDINGS: Normal heart size and vascularity. Negative for pneumonia, collapse, or consolidation. No edema pattern, effusion or pneumothorax. Trachea midline. Left upper lung ill-defined  12 mm nodular opacity noted which may represent superimposed vascular and bony shadows versus underlying developing nodule. No acute osseous finding. IMPRESSION: No acute chest process. 12 mm left upper lung nodule versus superimposed shadows. Recommend follow-up nonemergent chest CT for further evaluation. Electronically Signed   By: Judie Petit.  Shick M.D.   On: 01/10/2022 10:11    Procedures Procedures    Medications Ordered in ED Medications - No data to display  ED Course/ Medical Decision Making/ A&P                           Medical Decision Making Amount and/or Complexity of Data Reviewed Labs: ordered. Radiology: ordered.   This patient is a 42 y.o. female  who presents to the ED for concern of dizziness, palpitations, anxiety.   Differential diagnoses prior to evaluation: The emergent differential diagnosis includes, but is not limited to,  Cardiac arrhythmias, ACS, CHF, pericarditis, valvular disease, panic/anxiety, ETOH, stimulant use, medication side effect, anemia, hyperthyroidism, pulmonary embolism. This is not an exhaustive differential.   Past Medical History / Co-morbidities: eczema, hyperlipidemia, anxiety, depression, hypertension  Additional history: Chart reviewed. Pertinent results include:  Currently on Klonopin for anxiety; on a regimen of 5 mg amlodipine, 12.5 mg carvedilol, and 50 mg spironolactone for hypertension  Physical Exam: Physical exam performed. The pertinent findings include: Patient is clinically well-appearing.  Normotensive to 122/84.  Not tachycardic, heart rhythm regular.  In no acute distress.  Lab Tests/Imaging studies: I personally interpreted labs/imaging and the pertinent results include: CBC and BMP unremarkable.  Troponin <2.  X-ray without acute abnormalities. I agree with the radiologist interpretation.  Cardiac monitoring: EKG obtained and interpreted by my attending physician which shows: Sinus rhythm   Disposition: After consideration of the diagnostic results and the patients response to treatment, I feel that emergency department workup does not suggest an emergent condition requiring admission or immediate intervention beyond what has been performed at this time. The plan is: Discharged home with recommendation follow-up with PCP about management of hypertension and anxiety.  Not sure if an acute etiology leading to patient's reported soft blood pressures, but suspect some feelings of anxiety could have come from some compensating tachycardia.  Low concern for ACS or PE as patient has reassuring workup, negative troponins, no concerning EKG findings.  Low concern for vertigo/migraine as symptoms are more vague and not reproducible, not associated with any pain.  The patient is safe for discharge and has been instructed to return immediately for worsening symptoms, change in symptoms or any other concerns.  Final Clinical Impression(s) / ED Diagnoses Final diagnoses:  Palpitations  Anxiety  Low blood pressure reading    Rx / DC Orders ED Discharge Orders     None      Portions of this report may have been transcribed using voice recognition software. Every effort was made to ensure accuracy; however, inadvertent computerized transcription  errors may be present.    Jeanella Flattery 01/10/22 1309    Ernie Avena, MD 01/10/22 1753

## 2022-01-10 NOTE — ED Triage Notes (Signed)
Started "not feeling well" on Friday, lightheaded and palpitations. States hx of anxiety and thought she was having panic attack. States BP was reading low at home 98/67.

## 2022-01-11 ENCOUNTER — Ambulatory Visit: Payer: BC Managed Care – PPO | Admitting: Internal Medicine

## 2022-01-14 MED ORDER — CLONAZEPAM 1 MG PO TABS: 1.0000 mg | ORAL_TABLET | Freq: Two times a day (BID) | ORAL | 2 refills | Status: DC | PRN

## 2022-01-14 NOTE — Telephone Encounter (Signed)
Notified pt MD sent new rx for POF../l;mb

## 2022-01-14 NOTE — Addendum Note (Signed)
Addended by: Biagio Borg on: 01/14/2022 11:43 AM   Modules accepted: Orders

## 2022-01-14 NOTE — Telephone Encounter (Signed)
Patient called back and said the 2 is helping and asked [facility] [address] She said she has switched pharmacies and would also like this added as her pharmacy on file

## 2022-01-14 NOTE — Telephone Encounter (Signed)
Ok this is done erx

## 2022-01-24 ENCOUNTER — Ambulatory Visit: Payer: BC Managed Care – PPO | Admitting: Behavioral Health

## 2022-02-04 ENCOUNTER — Ambulatory Visit: Payer: BC Managed Care – PPO | Admitting: Behavioral Health

## 2022-02-04 NOTE — Progress Notes (Unsigned)
                Edie Darley L Delissa Silba, LMFT 

## 2022-02-21 ENCOUNTER — Encounter: Payer: Self-pay | Admitting: Internal Medicine

## 2022-02-23 ENCOUNTER — Encounter: Payer: Self-pay | Admitting: Psychiatry

## 2022-02-23 ENCOUNTER — Ambulatory Visit: Payer: BC Managed Care – PPO | Admitting: Psychiatry

## 2022-02-23 VITALS — BP 150/91 | HR 76 | Ht 63.0 in | Wt 151.4 lb

## 2022-02-23 DIAGNOSIS — G44019 Episodic cluster headache, not intractable: Secondary | ICD-10-CM | POA: Diagnosis not present

## 2022-02-23 DIAGNOSIS — G43009 Migraine without aura, not intractable, without status migrainosus: Secondary | ICD-10-CM | POA: Diagnosis not present

## 2022-02-23 MED ORDER — CARBAMAZEPINE ER 100 MG PO TB12: ORAL_TABLET | ORAL | 6 refills | Status: DC

## 2022-02-23 MED ORDER — ZOLMITRIPTAN 5 MG NA SOLN: 1.0000 | NASAL | 6 refills | Status: DC | PRN

## 2022-02-23 NOTE — Progress Notes (Signed)
   CC:  headaches  Follow-up Visit  Last visit: 10/22/20  Brief HPI: 42 year old female with a history of migraines, HTN who follows in clinic for daily headaches. MRI brain and CTA head/neck were unremarkable.  At her last visit she was continued on carbamazepine for headache prevention and started on diclofenac for rescue.  Interval History: She was headache free since her last visit until this weekend. Headaches are described as sharp pain behind her right eye and which radiates down to her right cheek and jaw. Denies ipsilateral autonomic symptoms. No neck/occiput pain. Pain can last from 10 minutes to 1.5 hours. Generally occurs in the middle of the night. She has had four episodes since this weekend. Continues to take carbamazepine 10 50m BID.  Diclofenac did not help with her migraines so she has stopped taking it.  Current Headache Regimen: Preventative: carbamazepine  Abortive: none   Prior Therapies                                  Carbamazepine 100 mg BID Flexeril 5 mg TID PRN Metoprolol 25 mg daily Imitrex 100 mg PRN - side effects Diclofenac - lack of efficacy  Physical Exam:   Vital Signs: BP (!) 150/91 (BP Location: Left Arm, Patient Position: Sitting, Cuff Size: Normal)   Pulse 76   Ht 5' 3"$  (1.6 m)   Wt 151 lb 6.4 oz (68.7 kg)   BMI 26.82 kg/m  GENERAL:  well appearing, in no acute distress, alert  SKIN:  Color, texture, turgor normal. No rashes or lesions HEAD:  Normocephalic/atraumatic. RESP: normal respiratory effort MSK:  No gross joint deformities.   NEUROLOGICAL: Mental Status: Alert, oriented to person, place and time, Follows commands, and Speech fluent and appropriate. Cranial Nerves: PERRL, face symmetric, no dysarthria, hearing grossly intact Motor: moves all extremities equally Gait: normal-based.  IMPRESSION: 42year old female with a history of migraines, HTN who presents for follow up of headaches. She has had a recurrence of right  sided headaches in the past week. Symptoms continue to be atypical pattern for headache or facial pain. Will increase carbamazepine as this previously improved her headaches. If ineffective would consider treatment for cluster headache vs occipital neuralgia. Will start nasal Zomig for rescue as this can help with both migraine and cluster headache. Offered prednisone taper, which she declined due to history of irritability on steroids.  PLAN: -Increase carbamazepine to 100/200 x1 week, then 200 mg BID -Start Zomig 5 mg nasal spray for rescue -Next steps: consider verapamil, TCA, Emgality, ON block   Follow-up: 6 months  I spent a total of 26 minutes on the date of the service. Headache education was done. Discussed treatment options including preventive and acute medications. Discussed medication side effects, adverse reactions and drug interactions. Written educational materials and patient instructions outlining all of the above were given.  JGenia Harold MD 02/23/22 2:56 PM

## 2022-03-04 ENCOUNTER — Ambulatory Visit: Payer: BC Managed Care – PPO | Admitting: Internal Medicine

## 2022-04-06 ENCOUNTER — Ambulatory Visit (INDEPENDENT_AMBULATORY_CARE_PROVIDER_SITE_OTHER): Payer: BC Managed Care – PPO | Admitting: Emergency Medicine

## 2022-04-06 ENCOUNTER — Encounter: Payer: Self-pay | Admitting: Emergency Medicine

## 2022-04-06 VITALS — BP 136/88 | HR 76 | Temp 98.4°F | Ht 63.0 in

## 2022-04-06 DIAGNOSIS — G43911 Migraine, unspecified, intractable, with status migrainosus: Secondary | ICD-10-CM | POA: Diagnosis not present

## 2022-04-06 MED ORDER — NURTEC 75 MG PO TBDP: 75.0000 mg | ORAL_TABLET | Freq: Every day | ORAL | 1 refills | Status: DC | PRN

## 2022-04-06 NOTE — Patient Instructions (Signed)
Migraine Headache A migraine headache is a very strong throbbing pain on one or both sides of your head. This type of headache can also cause other symptoms. It can last from 4 hours to 3 days. Talk with your doctor about what things may bring on (trigger) this condition. What are the causes? The exact cause of a migraine is not known. This condition may be brought on or caused by: Smoking. Medicines, such as: Medicine used to treat chest pain (nitroglycerin). Birth control pills. Estrogen. Some blood pressure medicines. Certain substances in some foods or drinks. Foods and drinks, such as: Cheese. Chocolate. Alcohol. Caffeine. Doing physical activity that is very hard. Other things that may trigger a migraine headache include: Periods. Pregnancy. Hunger. Stress. Getting too much or too little sleep. Weather changes. Feeling tired (fatigue). What increases the risk? Being 25-55 years old. Being female. Having a family history of migraine headaches. Being Caucasian. Having a mental health condition, such as being sad (depressed) or feeling worried or nervous (anxious). Being very overweight (obese). What are the signs or symptoms? A throbbing pain. This pain may: Happen in any area of the head, such as on one or both sides. Make it hard to do daily activities. Get worse with physical activity. Get worse around bright lights, loud noises, or smells. Other symptoms may include: Feeling like you may vomit (nauseous). Vomiting. Dizziness. Before a migraine headache starts, you may get warning signs (an aura). An aura may include: Seeing flashing lights or having blind spots. Seeing bright spots, halos, or zigzag lines. Having tunnel vision or blurred vision. Having numbness or a tingling feeling. Having trouble talking. Having weak muscles. After a migraine ends, you may have symptoms. These may include: Tiredness. Trouble thinking (concentrating). How is this  treated? Taking medicines that: Relieve pain. Relieve the feeling like you may vomit. Prevent migraine headaches. Treatment may also include: Acupuncture. Lifestyle changes like avoiding foods that bring on migraine headaches. Learning ways to control your body functions (biofeedback). Therapy to help you know and deal with negative thoughts (cognitive behavioral therapy). Follow these instructions at home: Medicines Take over-the-counter and prescription medicines only as told by your doctor. If told, take steps to prevent problems with pooping (constipation). You may need to: Drink enough fluid to keep your pee (urine) pale yellow. Take medicines. You will be told what medicines to take. Eat foods that are high in fiber. These include beans, whole grains, and fresh fruits and vegetables. Limit foods that are high in fat and sugar. These include fried or sweet foods. Ask your doctor if you should avoid driving or using machines while you are taking your medicine. Lifestyle  Do not drink alcohol. Do not smoke or use any products that contain nicotine or tobacco. If you need help quitting, ask your doctor. Get 7-9 hours of sleep each night, or the amount recommended by your doctor. Find ways to deal with stress, such as meditation, deep breathing, or yoga. Try to exercise often. This can help lessen how bad and how often your migraines happen. General instructions Keep a journal to find out what may bring on your migraine headaches. This can help you avoid those things. For example, write down: What you eat and drink. How much sleep you get. Any change to your medicines or diet. If you have a migraine headache: Avoid things that make your symptoms worse, such as bright lights. Lie down in a dark, quiet room. Do not drive or use machinery. Ask your   doctor what activities are safe for you. Where to find more information Coalition for Headache and Migraine Patients (CHAMP):  headachemigraine.org American Migraine Foundation: americanmigrainefoundation.org National Headache Foundation: headaches.org Contact a doctor if: You get a migraine headache that is different or worse than others you have had. You have more than 15 days of headaches in one month. Get help right away if: Your migraine headache gets very bad. Your migraine headache lasts more than 72 hours. You have a fever or stiff neck. You have trouble seeing. Your muscles feel weak or like you cannot control them. You lose your balance a lot. You have trouble walking. You faint. You have a seizure. This information is not intended to replace advice given to you by your health care provider. Make sure you discuss any questions you have with your health care provider. Document Revised: 08/16/2021 Document Reviewed: 08/16/2021 Elsevier Patient Education  2023 Elsevier Inc.  

## 2022-04-06 NOTE — Assessment & Plan Note (Signed)
History of chronic migraines Not responding to intranasal triptans Recommend Nurtec 75 mg sublingual.  Samples given. Need to follow-up with both neurologist and PCP ED precautions given

## 2022-04-06 NOTE — Progress Notes (Signed)
Kristie Nelson 42 y.o.   Chief Complaint  Patient presents with   Headache    Worse and more frequent     HISTORY OF PRESENT ILLNESS: Acute problem visit today.  Patient of Dr. Cathlean Cower. This is a 42 y.o. female with history of migraine headaches since 2020 complaining of typical bad migraine headache for the past week Denies nausea or vomiting.  Denies visual problems.  No other associated symptoms. No other complaints or medical concerns today.  Headache  Pertinent negatives include no abdominal pain, coughing, fever, nausea, sore throat, vomiting or weakness.     Prior to Admission medications   Medication Sig Start Date End Date Taking? Authorizing Provider  azelastine (OPTIVAR) 0.05 % ophthalmic solution Place 1 drop into both eyes 2 (two) times daily. 02/22/19  Yes Biagio Borg, MD  carbamazepine (TEGRETOL XR) 100 MG 12 hr tablet Take 1 pill in AM and 2 pills in PM for one week, then increase to 2 pills twice a day 02/23/22  Yes Genia Harold, MD  carvedilol (COREG) 12.5 MG tablet Take 12.5 mg by mouth 2 (two) times daily. 06/17/21  Yes [provider]  clonazePAM (KLONOPIN) 1 MG tablet Take 1 tablet (1 mg total) by mouth 2 (two) times daily as needed for anxiety. 01/14/22  Yes Biagio Borg, MD  cyclobenzaprine (FLEXERIL) 5 MG tablet Take 1 tablet (5 mg total) by mouth 3 (three) times daily as needed. for muscle spams 06/30/20  Yes Biagio Borg, MD  diclofenac (CATAFLAM) 50 MG tablet Take 50-100 mg as needed for migraines 10/22/20  Yes Chima, Anderson Malta, MD  escitalopram (LEXAPRO) 10 MG tablet Take 1 tablet (10 mg total) by mouth daily. 09/20/21 09/20/22 Yes Biagio Borg, MD  mometasone (ELOCON) 0.1 % ointment Apply topically daily. 07/19/18  Yes Padgett, Rae Halsted, MD  nitroGLYCERIN (NITROSTAT) 0.4 MG SL tablet Place 1 tablet (0.4 mg total) under the tongue every 5 (five) minutes as needed for chest pain. 07/25/21  Yes Dion Saucier A, PA  pimecrolimus (ELIDEL) 1  % cream Apply topically 2 (two) times daily. 07/19/18  Yes Kennith Gain, MD  rosuvastatin (CRESTOR) 10 MG tablet TAKE 1 TABLET BY MOUTH EVERY DAY 11/15/21  Yes Biagio Borg, MD  spironolactone (ALDACTONE) 50 MG tablet Take 50 mg by mouth daily.   Yes [provider]  triamcinolone (NASACORT AQ) 55 MCG/ACT AERO nasal inhaler Place 2 sprays into the nose daily. 02/22/19  Yes Biagio Borg, MD  triamcinolone cream (KENALOG) 0.1 % Apply 1 application topically 2 (two) times daily. 03/26/18  Yes Biagio Borg, MD  zolmitriptan (ZOMIG) 5 MG nasal solution Place 1 spray into the nose as needed (headache). May repeat a dose in 2 hours if needed. Max dose 2 sprays in 24 hours 02/23/22  Yes Genia Harold, MD    Allergies  Allergen Reactions   Dog Epithelium Itching   Latex Itching    swelling   Peanut-Containing Drug Products     REACTION: throat swelling   Penicillins     REACTION: itching   Tomato (Diagnostic) Itching and Swelling    Patient Active Problem List   Diagnosis Date Noted   Allergic rhinoconjunctivitis 10/11/2021   Mild mitral regurgitation by prior echocardiogram 07/31/2021   Prediabetes 11/15/2020   Acute cough 11/02/2020   Aortic atherosclerosis 09/27/2020   Abnormal thyroid exam 09/24/2020   Cephalalgia 09/19/2020   Left ear hearing loss 09/19/2020   Chest pain 08/08/2020  Acute upper back pain 08/06/2020   Sinusitis 04/18/2020   Viral illness 10/08/2019   Abdominal wall pain in both upper quadrants 12/12/2018   Patellofemoral arthralgia of both knees 11/15/2018   Left knee pain 09/28/2018   Palpitations 07/10/2017   Upper respiratory tract infection 02/20/2017   Thrombosed external hemorrhoid 12/29/2016   Menstrual periods irregular 10/06/2016   Hypokalemia 10/06/2016   Hemorrhoids 10/19/2015   Low back pain 09/14/2015   Allergic conjunctivitis 01/21/2015   Low back strain 05/14/2014   Chronic constipation 03/26/2013   Abdominal pain,  unspecified site 03/26/2013   Amenorrhea 02/18/2011   Encounter for well adult exam with abnormal findings 09/27/2010   Hyperlipidemia 12/09/2009   MIGRAINE, COMMON 12/09/2009   GANGLION CYST, WRIST, LEFT 12/09/2009   Eczema 03/19/2009   PARESTHESIA 09/23/2008   Anxiety state 04/03/2007   Depression 04/03/2007   Essential hypertension 04/03/2007   Allergic rhinitis 04/03/2007    Past Medical History:  Diagnosis Date   ALLERGIC RHINITIS 04/03/2007   Anal fissure    ANXIETY 04/03/2007   DEPRESSION 04/03/2007   Dysrhythmia    palpitations due to stress   ECZEMA 03/19/2009   Heartburn in pregnancy    Hemorrhoids    HYPERLIPIDEMIA 12/09/2009   HYPERTENSION 04/03/2007   MIGRAINE, COMMON 12/09/2009   Postoperative anemia due to acute blood loss 08/30/2012   Shortness of breath    with anxiety    Past Surgical History:  Procedure Laterality Date   CESAREAN SECTION  2010   CESAREAN SECTION N/A 08/29/2012   Procedure: Repeat CESAREAN SECTION;  Surgeon: Lovenia Kim, MD;  Location: [facility];  Service: Obstetrics;  Laterality: N/A;  EDD: 09/17/12;REQUEST DEE;[name]    Social History   Socioeconomic History   Marital status: Married    Spouse name: Not on file   Number of children: 2   Years of education: Masters   Highest education level: Not on file  Occupational History   Occupation: TEACHER    Employer: [employer]  Tobacco Use   Smoking status: Never   Smokeless tobacco: Never  Vaping Use   Vaping Use: Never used  Substance and Sexual Activity   Alcohol use: No    Alcohol/week: 0.0 standard drinks of alcohol   Drug use: No   Sexual activity: Not on file  Other Topics Concern   Not on file  Social History Narrative   Right handed   Caffeine use: starbucks drink once weekly   Kindergarten teacher - [employer]   Married, 1 son 2010 and 1 daughter 2014   02/25/2016   Social Determinants of Health   Financial Resource Strain: Not on file  Food  Insecurity: Not on file  Transportation Needs: Not on file  Physical Activity: Not on file  Stress: Not on file  Social Connections: Not on file  Intimate Partner Violence: Not on file    Family History  Problem Relation Age of Onset   Diabetes Mother    Hypertension Mother    Heart disease Mother    Hyperlipidemia Mother    Diabetes Father    Hypertension Father    Diabetes Brother    Hypertension Brother    Diabetes Maternal Grandmother    Hypertension Maternal Grandmother    Thyroid disease Maternal Grandmother    Hyperlipidemia Maternal Grandmother    Liver cancer Maternal Grandfather        mets   Hypertension Maternal Grandfather    Other Neg Hx  hyperaldosteronism     Review of Systems  Constitutional: Negative.  Negative for chills and fever.  HENT: Negative.  Negative for congestion and sore throat.   Respiratory: Negative.  Negative for cough and shortness of breath.   Cardiovascular: Negative.  Negative for chest pain and palpitations.  Gastrointestinal:  Negative for abdominal pain, diarrhea, nausea and vomiting.  Genitourinary: Negative.  Negative for dysuria and hematuria.  Skin: Negative.  Negative for rash.  Neurological:  Positive for headaches. Negative for sensory change, speech change, focal weakness and weakness.    Vitals:   04/06/22 1325  BP: 136/88  Pulse: 76  Temp: 98.4 F (36.9 C)  SpO2: 97%    Physical Exam Vitals reviewed.  Constitutional:      Appearance: She is well-developed.  HENT:     Head: Normocephalic.     Mouth/Throat:     Mouth: Mucous membranes are moist.     Pharynx: Oropharynx is clear.  Eyes:     Extraocular Movements: Extraocular movements intact.     Pupils: Pupils are equal, round, and reactive to light.  Neck:     Vascular: No carotid bruit.  Cardiovascular:     Rate and Rhythm: Normal rate and regular rhythm.     Pulses: Normal pulses.     Heart sounds: Normal heart sounds.  Pulmonary:      Effort: Pulmonary effort is normal.     Breath sounds: Normal breath sounds.  Musculoskeletal:     Cervical back: No tenderness.  Lymphadenopathy:     Cervical: No cervical adenopathy.  Skin:    General: Skin is warm and dry.  Neurological:     General: No focal deficit present.     Mental Status: She is alert and oriented to person, place, and time.     Cranial Nerves: No cranial nerve deficit.     Sensory: No sensory deficit.     Motor: No weakness.     Coordination: Coordination normal.     Gait: Gait normal.  Psychiatric:        Mood and Affect: Mood normal.        Behavior: Behavior normal.      ASSESSMENT & PLAN: A total of 35 minutes was spent with the patient and counseling/coordination of care regarding preparing for this visit, review of most recent office visit notes, review of chronic medical conditions under management, review of all medications, diagnosis of chronic migraines and management, treatment of acute episodes, prognosis, documentation, need to follow-up with neurologist and PCP.Marland Kitchen  Problem List Items Addressed This Visit       Cardiovascular and Mediastinum   Intractable migraine with status migrainosus - Primary    History of chronic migraines Not responding to intranasal triptans Recommend Nurtec 75 mg sublingual.  Samples given. Need to follow-up with both neurologist and PCP ED precautions given      Relevant Medications   Rimegepant Sulfate (NURTEC) 75 MG TBDP   Patient Instructions  Migraine Headache A migraine headache is a very strong throbbing pain on one or both sides of your head. This type of headache can also cause other symptoms. It can last from 4 hours to 3 days. Talk with your doctor about what things may bring on (trigger) this condition. What are the causes? The exact cause of a migraine is not known. This condition may be brought on or caused by: Smoking. Medicines, such as: Medicine used to treat chest pain  (nitroglycerin). Birth control pills. Estrogen. Some blood  pressure medicines. Certain substances in some foods or drinks. Foods and drinks, such as: Cheese. Chocolate. Alcohol. Caffeine. Doing physical activity that is very hard. Other things that may trigger a migraine headache include: Periods. Pregnancy. Hunger. Stress. Getting too much or too little sleep. Weather changes. Feeling tired (fatigue). What increases the risk? Being 22-30 years old. Being female. Having a family history of migraine headaches. Being Caucasian. Having a mental health condition, such as being sad (depressed) or feeling worried or nervous (anxious). Being very overweight (obese). What are the signs or symptoms? A throbbing pain. This pain may: Happen in any area of the head, such as on one or both sides. Make it hard to do daily activities. Get worse with physical activity. Get worse around bright lights, loud noises, or smells. Other symptoms may include: Feeling like you may vomit (nauseous). Vomiting. Dizziness. Before a migraine headache starts, you may get warning signs (an aura). An aura may include: Seeing flashing lights or having blind spots. Seeing bright spots, halos, or zigzag lines. Having tunnel vision or blurred vision. Having numbness or a tingling feeling. Having trouble talking. Having weak muscles. After a migraine ends, you may have symptoms. These may include: Tiredness. Trouble thinking (concentrating). How is this treated? Taking medicines that: Relieve pain. Relieve the feeling like you may vomit. Prevent migraine headaches. Treatment may also include: Acupuncture. Lifestyle changes like avoiding foods that bring on migraine headaches. Learning ways to control your body functions (biofeedback). Therapy to help you know and deal with negative thoughts (cognitive behavioral therapy). Follow these instructions at home: Medicines Take over-the-counter and  prescription medicines only as told by your doctor. If told, take steps to prevent problems with pooping (constipation). You may need to: Drink enough fluid to keep your pee (urine) pale yellow. Take medicines. You will be told what medicines to take. Eat foods that are high in fiber. These include beans, whole grains, and fresh fruits and vegetables. Limit foods that are high in fat and sugar. These include fried or sweet foods. Ask your doctor if you should avoid driving or using machines while you are taking your medicine. Lifestyle  Do not drink alcohol. Do not smoke or use any products that contain nicotine or tobacco. If you need help quitting, ask your doctor. Get 7-9 hours of sleep each night, or the amount recommended by your doctor. Find ways to deal with stress, such as meditation, deep breathing, or yoga. Try to exercise often. This can help lessen how bad and how often your migraines happen. General instructions Keep a journal to find out what may bring on your migraine headaches. This can help you avoid those things. For example, write down: What you eat and drink. How much sleep you get. Any change to your medicines or diet. If you have a migraine headache: Avoid things that make your symptoms worse, such as bright lights. Lie down in a dark, quiet room. Do not drive or use machinery. Ask your doctor what activities are safe for you. Where to find more information Coalition for Headache and Migraine Patients (CHAMP): headachemigraine.org American Migraine Foundation: americanmigrainefoundation.org National Headache Foundation: headaches.org Contact a doctor if: You get a migraine headache that is different or worse than others you have had. You have more than 15 days of headaches in one month. Get help right away if: Your migraine headache gets very bad. Your migraine headache lasts more than 72 hours. You have a fever or stiff neck. You have trouble seeing. Your  muscles feel weak or like you cannot control them. You lose your balance a lot. You have trouble walking. You faint. You have a seizure. This information is not intended to replace advice given to you by your health care provider. Make sure you discuss any questions you have with your health care provider. Document Revised: 08/16/2021 Document Reviewed: 08/16/2021 Elsevier Patient Education  [place], MD [facility]

## 2022-04-08 ENCOUNTER — Ambulatory Visit: Payer: BC Managed Care – PPO | Admitting: Internal Medicine

## 2022-05-02 ENCOUNTER — Encounter: Payer: Self-pay | Admitting: Family Medicine

## 2022-05-02 ENCOUNTER — Ambulatory Visit: Payer: BC Managed Care – PPO | Admitting: Family Medicine

## 2022-05-02 VITALS — BP 122/60 | HR 80 | Temp 98.2°F | Resp 20 | Ht 63.0 in | Wt 155.0 lb

## 2022-05-02 DIAGNOSIS — J0110 Acute frontal sinusitis, unspecified: Secondary | ICD-10-CM

## 2022-05-02 DIAGNOSIS — M542 Cervicalgia: Secondary | ICD-10-CM

## 2022-05-02 MED ORDER — METHOCARBAMOL 500 MG PO TABS: 500.0000 mg | ORAL_TABLET | Freq: Three times a day (TID) | ORAL | 0 refills | Status: DC | PRN

## 2022-05-02 MED ORDER — AZITHROMYCIN 250 MG PO TABS: ORAL_TABLET | ORAL | 0 refills | Status: AC

## 2022-05-02 NOTE — Progress Notes (Signed)
Assessment & Plan:  1. Acute non-recurrent frontal sinusitis Education provided on sinus infections. Discussed symptom management including throat lozenges, chloraseptic spray, warm salt water gargles, hot tea/honey, cough syrup (Delsym), Tylenol/Ibuprofen, Vicks, and a humidifier at night.  - azithromycin (ZITHROMAX) 250 MG tablet; Take 2 tablets (500 mg total) by mouth daily for 1 day, THEN 1 tablet (250 mg total) daily for 4 days.  Dispense: 6 each; Refill: 0  2. Musculoskeletal neck pain Education provided on muscle pain. Encouraged Ibuprofen, Biofreeze, Lidoderm/Biofreeze patches, and a heating pad. Discussed likely related to the position in which she is sleeping or her pillow. - methocarbamol (ROBAXIN) 500 MG tablet; Take 1 tablet (500 mg total) by mouth every 8 (eight) hours as needed for muscle spasms.  Dispense: 60 tablet; Refill: 0  No results found for any visits on 05/02/22.   Follow up plan: Return if symptoms worsen or fail to improve.  Deliah Boston, MSN, APRN, FNP-C  Subjective:  HPI: Kristie Nelson is a 42 y.o. female presenting on 05/02/2022 for Neck Pain (Always the left side -wakes with it often, it stays for a few days and then gets better./) and Sinusitis (Facial and jaw pressure, ear pain left, right ear is popping, low grade fever on Friday, congestion. Started 5 days ago )  Patient complains of cough, head congestion, ear pain/pressure, facial pain/pressure, fever, shortness of breath, and jaw pain . Max temp 100.4 three days ago. She denies wheezing. Onset of symptoms was 5 days ago, gradually improving since that time. She is drinking plenty of fluids. Evaluation to date: none. Treatment to date:  Mucinex . She does not smoke.   Patient also reports left sided neck pain that comes and goes for the past month. She feels it in the morning upon waking for 2-3 days and then it will resolve. Tylenol is only mildly effective.    ROS: Negative unless specifically  indicated above in HPI.   Relevant past medical history reviewed and updated as indicated.   Allergies and medications reviewed and updated.   Current Outpatient Medications:    carbamazepine (TEGRETOL XR) 100 MG 12 hr tablet, Take 1 pill in AM and 2 pills in PM for one week, then increase to 2 pills twice a day, Disp: 120 tablet, Rfl: 6   carvedilol (COREG) 12.5 MG tablet, Take 12.5 mg by mouth 2 (two) times daily., Disp: , Rfl:    clonazePAM (KLONOPIN) 1 MG tablet, Take 1 tablet (1 mg total) by mouth 2 (two) times daily as needed for anxiety., Disp: 60 tablet, Rfl: 2   cyclobenzaprine (FLEXERIL) 5 MG tablet, Take 1 tablet (5 mg total) by mouth 3 (three) times daily as needed. for muscle spams, Disp: 60 tablet, Rfl: 2   escitalopram (LEXAPRO) 10 MG tablet, Take 1 tablet (10 mg total) by mouth daily., Disp: 90 tablet, Rfl: 3   mometasone (ELOCON) 0.1 % ointment, Apply topically daily., Disp: 45 g, Rfl: 0   pimecrolimus (ELIDEL) 1 % cream, Apply topically 2 (two) times daily., Disp: 60 g, Rfl: 4   Rimegepant Sulfate (NURTEC) 75 MG TBDP, Take 1 tablet (75 mg total) by mouth daily as needed., Disp: 15 tablet, Rfl: 1   rosuvastatin (CRESTOR) 10 MG tablet, TAKE 1 TABLET BY MOUTH EVERY DAY, Disp: 90 tablet, Rfl: 2   spironolactone (ALDACTONE) 50 MG tablet, Take 50 mg by mouth daily., Disp: , Rfl:    triamcinolone cream (KENALOG) 0.1 %, Apply 1 application topically 2 (two) times  daily., Disp: 45 g, Rfl: 2   nitroGLYCERIN (NITROSTAT) 0.4 MG SL tablet, Place 1 tablet (0.4 mg total) under the tongue every 5 (five) minutes as needed for chest pain. (Patient not taking: Reported on 05/02/2022), Disp: 30 tablet, Rfl: 0  Allergies  Allergen Reactions   Dog Epithelium Itching   Latex Itching    swelling   Peanut-Containing Drug Products     REACTION: throat swelling   Penicillins     REACTION: itching   Tomato (Diagnostic) Itching and Swelling    Objective:   BP 122/60   Pulse 80   Temp 98.2 F  (36.8 C)   Resp 20   Ht 5\' 3"  (1.6 m)   Wt 155 lb (70.3 kg)   LMP 04/25/2022 (Approximate)   BMI 27.46 kg/m    Physical Exam Vitals reviewed.  Constitutional:      General: She is not in acute distress.    Appearance: Normal appearance. She is not ill-appearing, toxic-appearing or diaphoretic.  HENT:     Head: Normocephalic and atraumatic.     Right Ear: Tympanic membrane, ear canal and external ear normal. There is no impacted cerumen.     Left Ear: Tympanic membrane, ear canal and external ear normal. There is no impacted cerumen.     Nose: Congestion present. No rhinorrhea.     Right Sinus: Frontal sinus tenderness present. No maxillary sinus tenderness.     Left Sinus: Frontal sinus tenderness present. No maxillary sinus tenderness.     Mouth/Throat:     Mouth: Mucous membranes are moist.     Pharynx: Oropharynx is clear. No oropharyngeal exudate or posterior oropharyngeal erythema.  Eyes:     General: No scleral icterus.       Right eye: No discharge.        Left eye: No discharge.     Conjunctiva/sclera: Conjunctivae normal.  Neck:     Comments: Area of pain occurs along the trapezius muscle on the left side.  Cardiovascular:     Rate and Rhythm: Normal rate and regular rhythm.     Heart sounds: Normal heart sounds. No murmur heard.    No friction rub. No gallop.  Pulmonary:     Effort: Pulmonary effort is normal. No respiratory distress.     Breath sounds: Normal breath sounds. No stridor. No wheezing, rhonchi or rales.  Musculoskeletal:        General: Normal range of motion.     Cervical back: Normal range of motion.  Lymphadenopathy:     Cervical: Cervical adenopathy present.  Skin:    General: Skin is warm and dry.     Capillary Refill: Capillary refill takes less than 2 seconds.  Neurological:     General: No focal deficit present.     Mental Status: She is alert and oriented to person, place, and time. Mental status is at baseline.  Psychiatric:         Mood and Affect: Mood normal.        Behavior: Behavior normal.        Thought Content: Thought content normal.        Judgment: Judgment normal.

## 2022-05-02 NOTE — Patient Instructions (Signed)
Biofreeze Ibuprofen Lidoderm/Biofreeze patches Heating pad   Throat lozenges, chloraseptic spray, warm salt water gargles, hot tea/honey, cough syrup (Delsym), Tylenol/Ibuprofen, Vicks, and a humidifier at night.

## 2022-07-05 LAB — HM MAMMOGRAPHY

## 2022-07-16 ENCOUNTER — Other Ambulatory Visit: Payer: Self-pay | Admitting: Internal Medicine

## 2022-08-02 ENCOUNTER — Ambulatory Visit: Payer: BC Managed Care – PPO | Admitting: Psychiatry

## 2022-08-04 ENCOUNTER — Encounter: Payer: Self-pay | Admitting: Internal Medicine

## 2022-08-04 MED ORDER — NIRMATRELVIR/RITONAVIR (PAXLOVID)TABLET: 3.0000 | ORAL_TABLET | Freq: Two times a day (BID) | ORAL | 0 refills | Status: AC

## 2022-08-05 ENCOUNTER — Ambulatory Visit: Payer: BC Managed Care – PPO | Admitting: Internal Medicine

## 2022-08-07 ENCOUNTER — Other Ambulatory Visit: Payer: Self-pay | Admitting: Internal Medicine

## 2022-08-08 MED ORDER — HYDROCODONE BIT-HOMATROP MBR 5-1.5 MG/5ML PO SOLN: 5.0000 mL | Freq: Four times a day (QID) | ORAL | 0 refills | Status: AC | PRN

## 2022-08-08 NOTE — Addendum Note (Signed)
Addended by: Corwin Levins on: 08/08/2022 12:54 PM   Modules accepted: Orders

## 2022-09-02 MED ORDER — ESCITALOPRAM OXALATE 20 MG PO TABS: 20.0000 mg | ORAL_TABLET | Freq: Every day | ORAL | 3 refills | Status: DC

## 2022-09-02 NOTE — Addendum Note (Signed)
Addended by: Corwin Levins on: 09/02/2022 10:50 AM   Modules accepted: Orders

## 2022-09-03 IMAGING — DX DG CHEST 2V
2 series · 2 of 2 positions shown · non-contrast
Comparison: 04/09/2003

CLINICAL DATA: Anterior chest pain 4 days

EXAM:
CHEST - 2 VIEW

[chest pa]
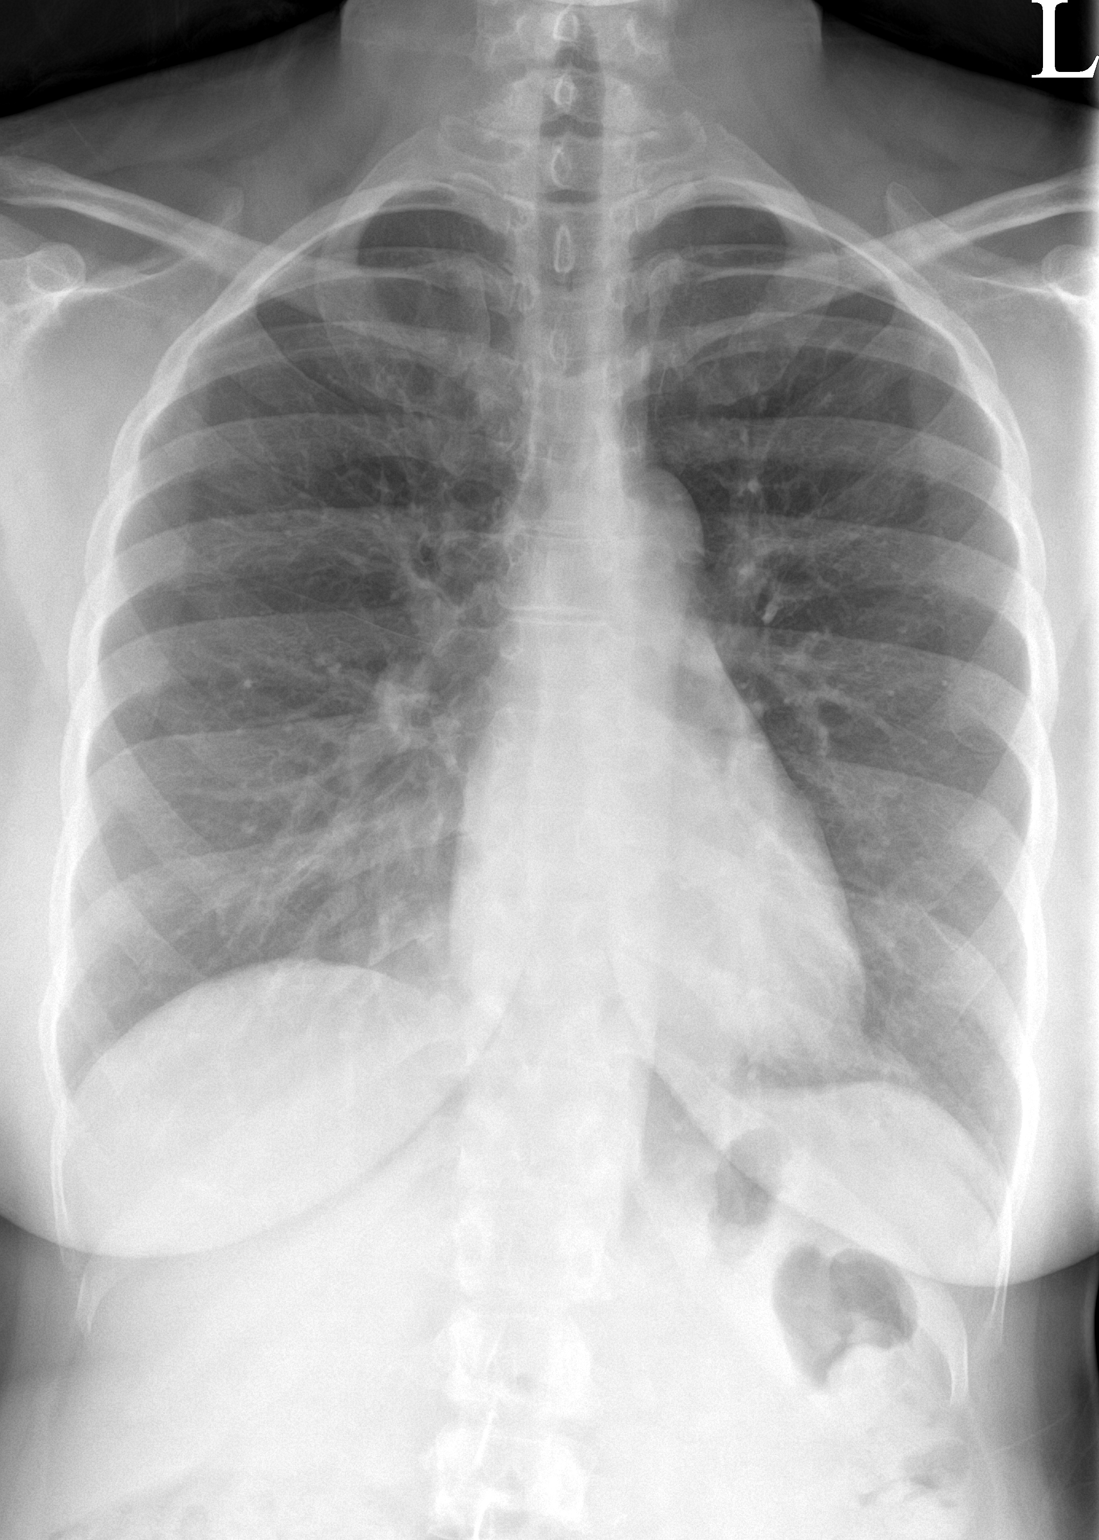

[chest lat]
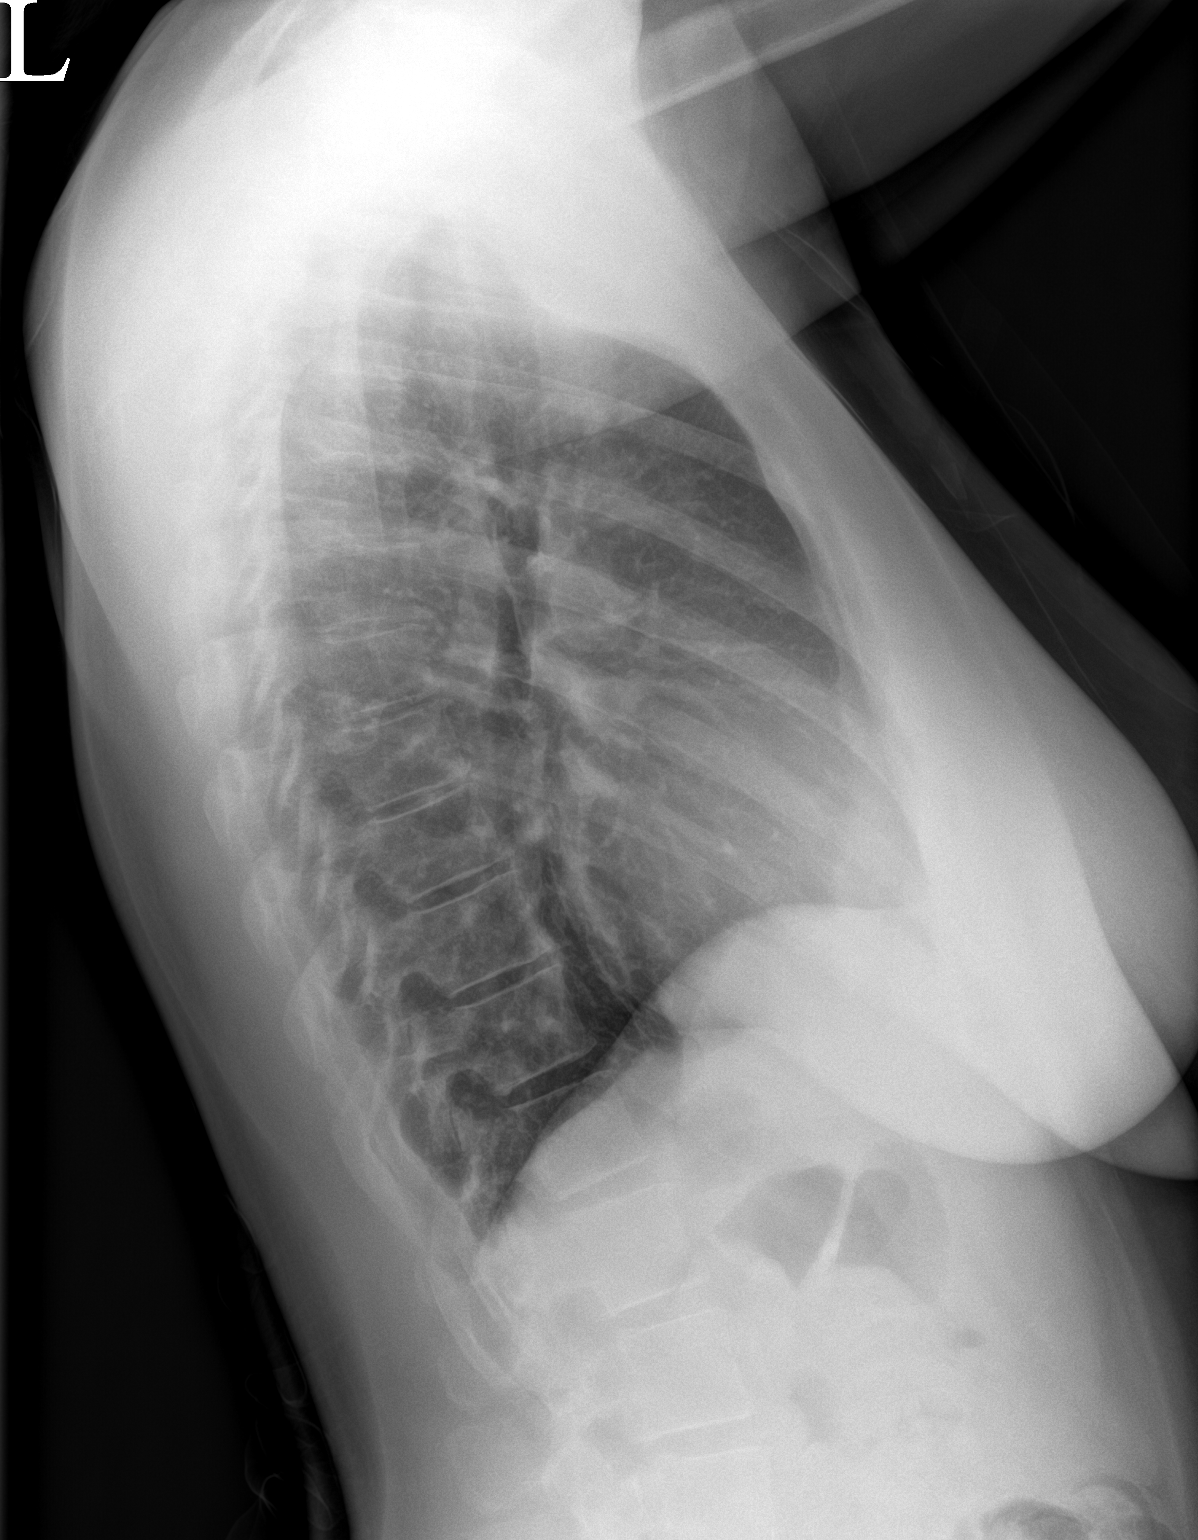

[2 of 2 positions shown; findings below may reference images not displayed]

FINDINGS: The heart size and mediastinal contours are within normal limits.
Both lungs are clear. The visualized skeletal structures are
unremarkable.
IMPRESSION: No active cardiopulmonary disease.

## 2022-09-19 ENCOUNTER — Ambulatory Visit: Payer: BC Managed Care – PPO | Admitting: Psychiatry

## 2022-09-28 ENCOUNTER — Ambulatory Visit (INDEPENDENT_AMBULATORY_CARE_PROVIDER_SITE_OTHER): Payer: BC Managed Care – PPO | Admitting: Behavioral Health

## 2022-09-28 DIAGNOSIS — F418 Other specified anxiety disorders: Secondary | ICD-10-CM

## 2022-09-28 DIAGNOSIS — Z87898 Personal history of other specified conditions: Secondary | ICD-10-CM
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
## 2022-09-28 NOTE — Progress Notes (Signed)
                Anastasya Jewell L Farryn Linares, LMFT 

## 2022-10-13 ENCOUNTER — Ambulatory Visit: Payer: BC Managed Care – PPO | Admitting: Behavioral Health

## 2022-10-13 NOTE — Progress Notes (Unsigned)
                Anastasya Jewell L Farryn Linares, LMFT 

## 2022-10-17 ENCOUNTER — Ambulatory Visit (INDEPENDENT_AMBULATORY_CARE_PROVIDER_SITE_OTHER): Payer: BC Managed Care – PPO | Admitting: Behavioral Health

## 2022-10-17 DIAGNOSIS — F418 Other specified anxiety disorders: Secondary | ICD-10-CM | POA: Diagnosis not present

## 2022-10-17 DIAGNOSIS — Z87898 Personal history of other specified conditions: Secondary | ICD-10-CM | POA: Diagnosis not present

## 2022-10-17 NOTE — Progress Notes (Signed)
                Kristie Jewell L Farryn Linares, LMFT 
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
## 2022-11-06 IMAGING — US US THYROID
1 series · 13 of 25 positions shown · non-contrast
Comparison: CTA 09/15/2020

CLINICAL DATA: Heterogenous thyroid on recent CTA neck

EXAM:
THYROID ULTRASOUND
TECHNIQUE: Ultrasound examination of the thyroid gland and adjacent soft
tissues was performed.

[Series 1: us thyroid · 0.07mm/px · 13 of 61 slices shown]
[im 1/61]
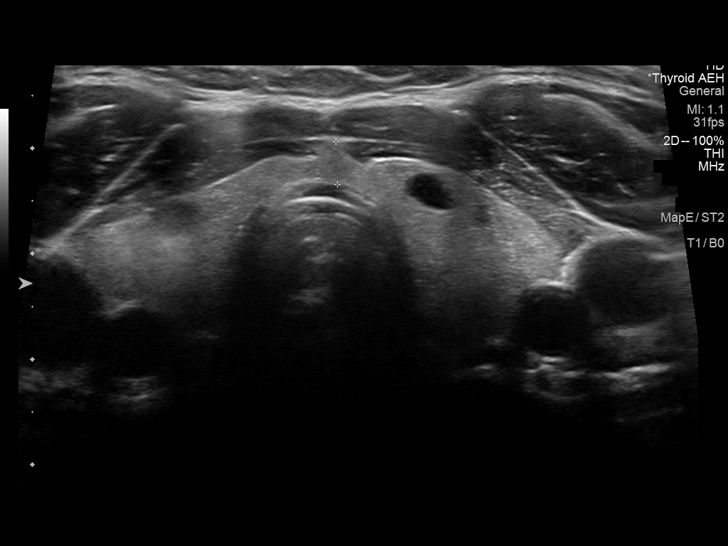
[im 6/61]
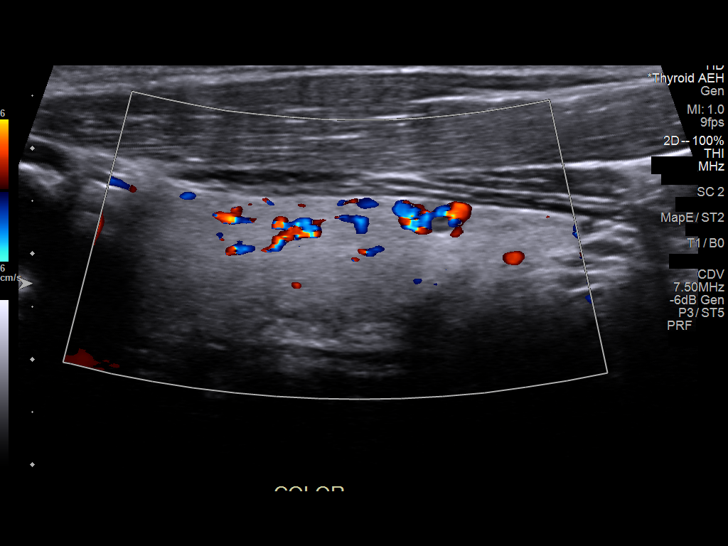
[im 11/61]
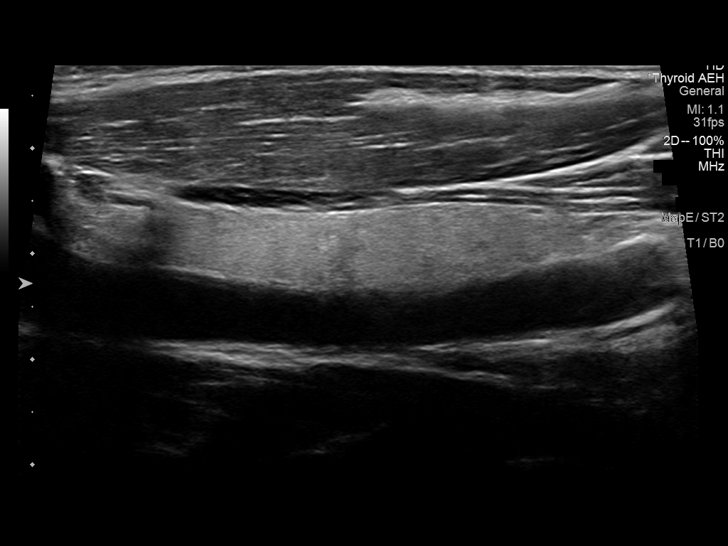
[im 16/61]
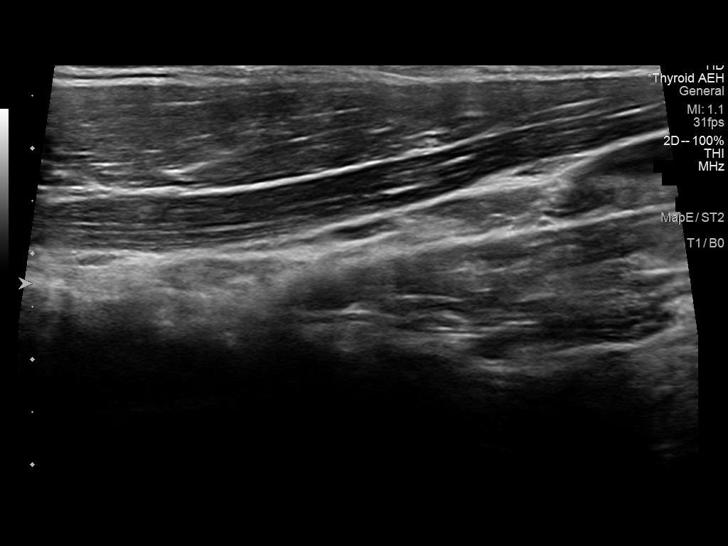
[im 21/61]
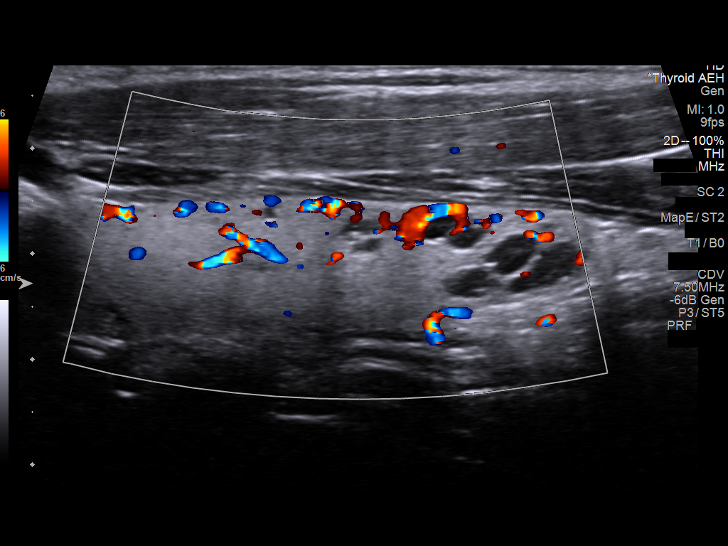
[im 26/61]
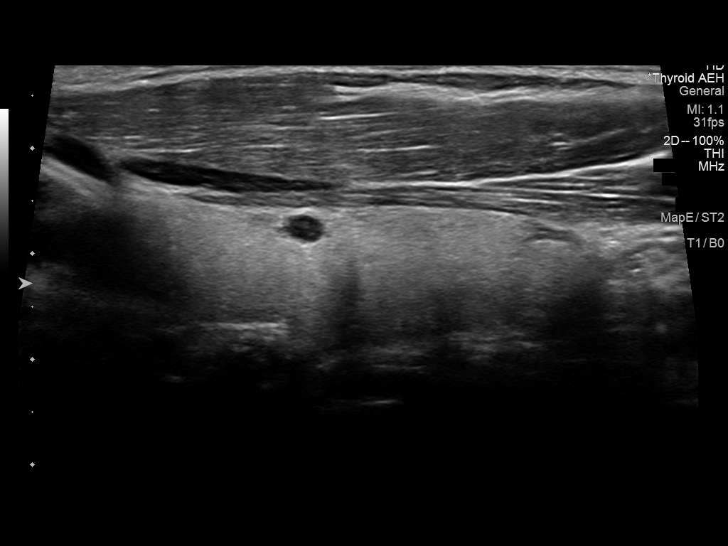
[im 31/61]
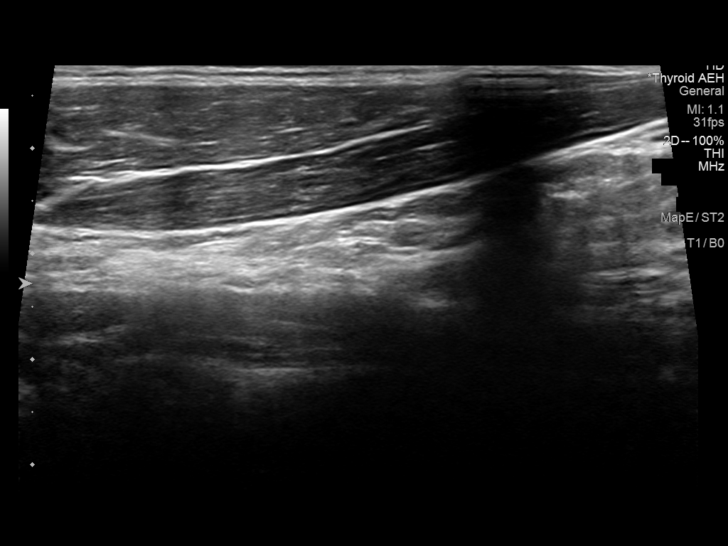
[im 36/61]
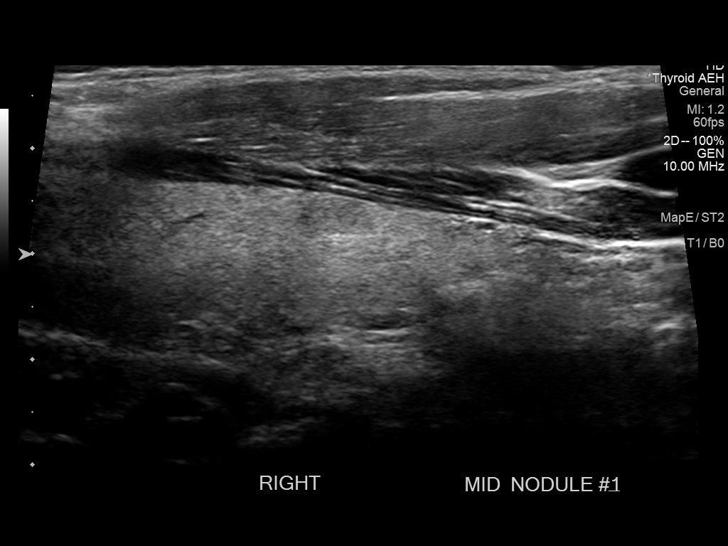
[im 41/61]
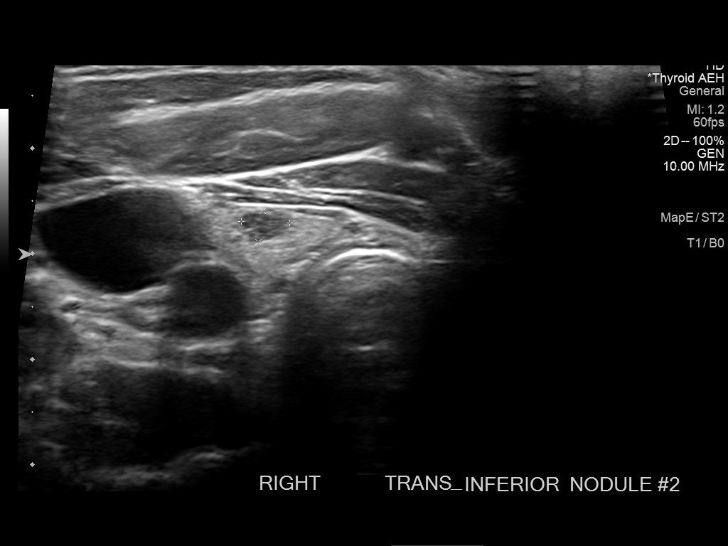
[im 46/61]
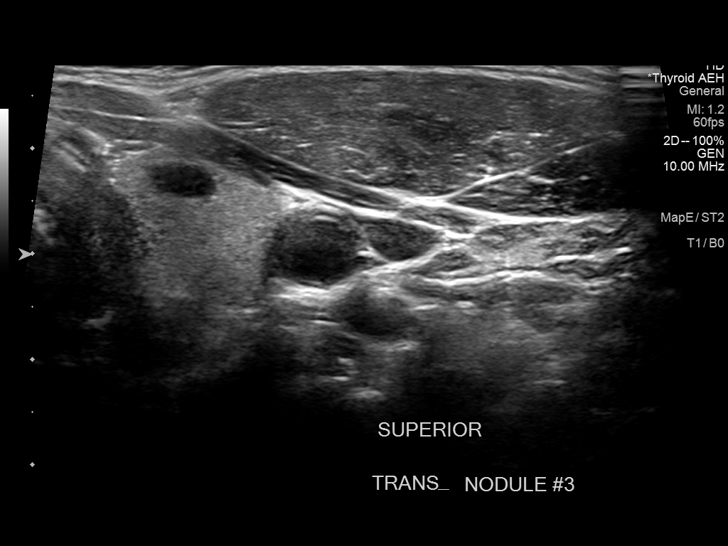
[im 51/61]
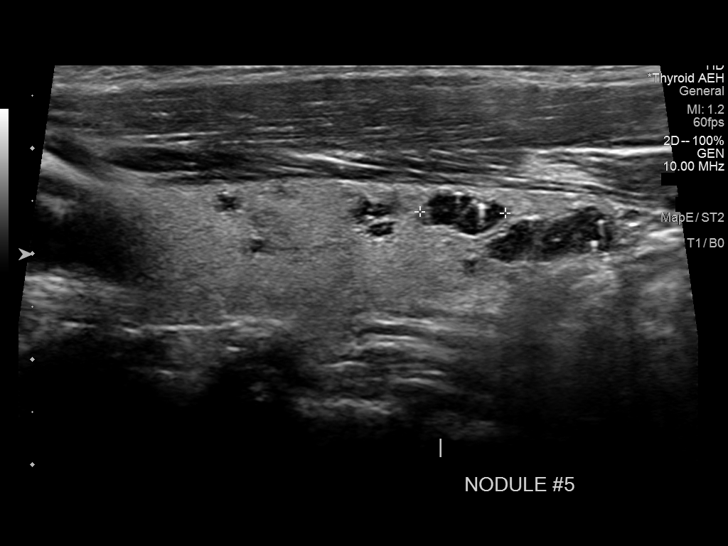
[im 56/61]
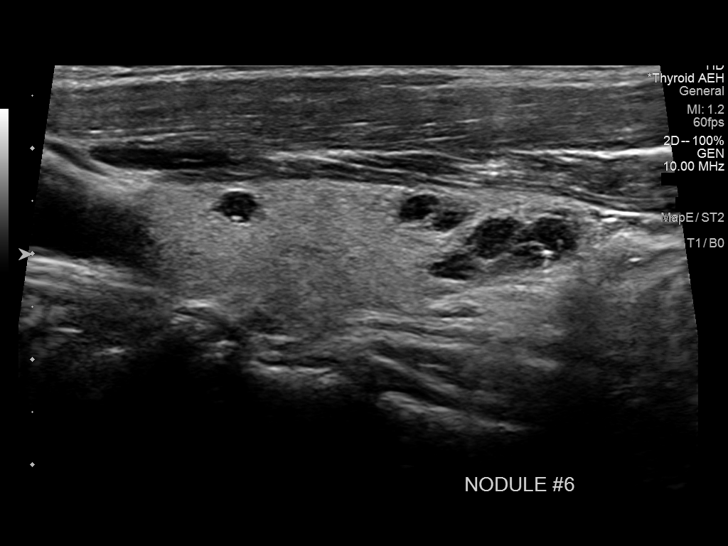
[im 61/61]
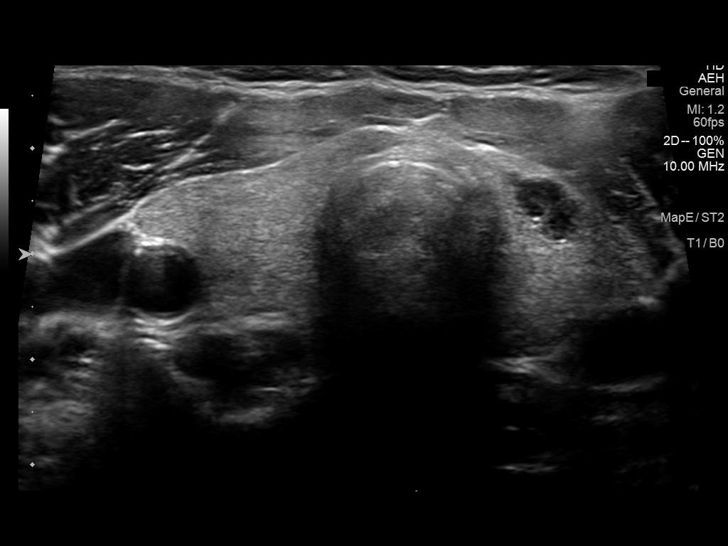

[13 of 25 positions shown; findings below may reference images not displayed]

FINDINGS: Parenchymal Echotexture: Mildly heterogenous

Isthmus: 0.4 cm thickness

Right lobe: 4.7 x 1.2 x 1.6 cm

Left lobe: 5.1 x 1.3 x 1.6 cm

_________________________________________________________

Estimated total number of nodules >/= 1 cm: 1

Number of spongiform nodules >/=  2 cm not described below (TR1): 0

Number of mixed cystic and solid nodules >/= 1.5 cm not described
below (TR2): 0

_________________________________________________________

Nodule 1: 0.7 cm nearly isoechoic nodule without calcifications, mid
right; This nodule does NOT meet TI-RADS criteria for biopsy or
dedicated follow-up.

Nodule 2: 0.6 cm hypoechoic nodule without calcifications, inferior
right; This nodule does NOT meet TI-RADS criteria for biopsy or
dedicated follow-up.

Nodule 3: 0.6 cm benign colloid cyst, superior left.

Nodule 4: 0.9 cm complex cyst, mid left; This nodule does NOT meet
TI-RADS criteria for biopsy or dedicated follow-up.

Nodule 5: 0.9 cm complex cyst, mid/inferior left; This nodule does
NOT meet TI-RADS criteria for biopsy or dedicated follow-up.

Nodule # 6:

Location: Left; inferior

Maximum size: 1.4 cm; Other 2 dimensions: 0.9 x 0.5 cm

Composition: cannot determine (2)

Echogenicity: hypoechoic (2)

Shape: not taller-than-wide (0)

Margins: smooth (0)

Echogenic foci: large comet-tail artifacts (0)

ACR TI-RADS total points: 4.

ACR TI-RADS risk category: TR4 (4-6 points).

ACR TI-RADS recommendations:

*Given size (>/= 1 - 1.4 cm) and appearance, a follow-up ultrasound
in 1 year should be considered based on TI-RADS criteria.

_________________________________________________________

No regional cervical adenopathy identified.
IMPRESSION: 1. Prominent thyroid with bilateral nodules. None meets criteria for
biopsy.
2. Recommend annual/biennial ultrasound follow-up of inferior left
nodule as above, until stability x5 years confirmed.

The above is in keeping with the ACR TI-RADS recommendations - [HOSPITAL] 3987;[DATE].

## 2022-11-08 ENCOUNTER — Ambulatory Visit: Payer: BC Managed Care – PPO | Admitting: Behavioral Health

## 2022-11-08 NOTE — Progress Notes (Unsigned)
                Anastasya Jewell L Farryn Linares, LMFT 

## 2022-11-29 ENCOUNTER — Ambulatory Visit: Payer: BC Managed Care – PPO | Admitting: Behavioral Health

## 2022-12-20 ENCOUNTER — Ambulatory Visit: Payer: BC Managed Care – PPO | Admitting: Internal Medicine

## 2023-01-31 ENCOUNTER — Ambulatory Visit: Payer: 59 | Admitting: Behavioral Health

## 2023-01-31 DIAGNOSIS — Z87898 Personal history of other specified conditions: Secondary | ICD-10-CM

## 2023-01-31 DIAGNOSIS — Z635 Disruption of family by separation and divorce: Secondary | ICD-10-CM

## 2023-01-31 DIAGNOSIS — F418 Other specified anxiety disorders: Secondary | ICD-10-CM

## 2023-01-31 NOTE — Progress Notes (Signed)
Deneise Lever, LMFT

## 2023-02-02 ENCOUNTER — Encounter: Payer: Self-pay | Admitting: Internal Medicine

## 2023-02-02 MED ORDER — CYCLOBENZAPRINE HCL 5 MG PO TABS: 5.0000 mg | ORAL_TABLET | Freq: Three times a day (TID) | ORAL | 1 refills | Status: DC | PRN
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
## 2023-02-28 ENCOUNTER — Ambulatory Visit: Payer: 59 | Admitting: Behavioral Health

## 2023-02-28 NOTE — Progress Notes (Unsigned)
   Kristie Lever, LMFT

## 2023-03-04 ENCOUNTER — Other Ambulatory Visit: Payer: Self-pay

## 2023-03-04 ENCOUNTER — Encounter (HOSPITAL_BASED_OUTPATIENT_CLINIC_OR_DEPARTMENT_OTHER): Payer: Self-pay | Admitting: Emergency Medicine

## 2023-03-04 ENCOUNTER — Emergency Department (HOSPITAL_BASED_OUTPATIENT_CLINIC_OR_DEPARTMENT_OTHER)
Admission: EM | Admit: 2023-03-04 | Discharge: 2023-03-04 | Disposition: A | Discharge status: Home / Self Care | Attending: Emergency Medicine | Admitting: Emergency Medicine

## 2023-03-04 DIAGNOSIS — J069 Acute upper respiratory infection, unspecified: Secondary | ICD-10-CM | POA: Diagnosis not present

## 2023-03-04 DIAGNOSIS — Z91010 Allergy to peanuts: Secondary | ICD-10-CM | POA: Diagnosis not present

## 2023-03-04 DIAGNOSIS — Z79899 Other long term (current) drug therapy: Secondary | ICD-10-CM | POA: Insufficient documentation

## 2023-03-04 DIAGNOSIS — Z91040 Latex allergy status: Secondary | ICD-10-CM | POA: Diagnosis not present

## 2023-03-04 DIAGNOSIS — R059 Cough, unspecified: Secondary | ICD-10-CM | POA: Diagnosis present

## 2023-03-04 LAB — RESP PANEL BY RT-PCR (RSV, FLU A&B, COVID)  RVPGX2
Influenza A by PCR: NEGATIVE
Influenza B by PCR: NEGATIVE
Resp Syncytial Virus by PCR: NEGATIVE
SARS Coronavirus 2 by RT PCR: NEGATIVE

## 2023-03-04 MED ORDER — FLUTICASONE PROPIONATE 50 MCG/ACT NA SUSP: 2.0000 | Freq: Every day | NASAL | 0 refills | Status: AC

## 2023-03-04 MED ORDER — PSEUDOEPHEDRINE HCL 30 MG PO TABS: 30.0000 mg | ORAL_TABLET | ORAL | 0 refills | Status: DC | PRN

## 2023-03-04 MED ORDER — LORATADINE 10 MG PO TABS: 10.0000 mg | ORAL_TABLET | Freq: Every day | ORAL | 0 refills | Status: DC

## 2023-03-04 NOTE — ED Notes (Signed)
 Lab called about Resp swab

## 2023-03-04 NOTE — Discharge Instructions (Signed)
 Please read and follow all provided instructions.  Your diagnoses today include:  1. Viral URI with cough    You appear to have an upper respiratory infection (URI). An upper respiratory tract infection, or cold, is a viral infection of the air passages leading to the lungs. It should improve gradually after 5-7 days. You may have a lingering cough that lasts for 2- 4 weeks after the infection.  Tests performed today include: Vital signs. See below for your results today.   Home care instructions:  You can take Tylenol and/or Ibuprofen as directed on the packaging for fever reduction and pain relief.    For cough: honey 1/2 to 1 teaspoon (you can dilute the honey in water or another fluid).  You can also use guaifenesin and dextromethorphan for cough. You can use a humidifier for chest congestion and cough.  If you don't have a humidifier, you can sit in the bathroom with the hot shower running.      For sore throat: try warm salt water gargles, cepacol lozenges, throat spray, warm tea or water with lemon/honey, popsicles or ice, or OTC cold relief medicine for throat discomfort.    For congestion: take a daily anti-histamine like Zyrtec, Claritin, and a oral decongestant, such as pseudoephedrine.  You can also use Flonase 1-2 sprays in each nostril daily.    It is important to stay hydrated: drink plenty of fluids (water, gatorade/powerade/pedialyte, juices, or teas) to keep your throat moisturized and help further relieve irritation/discomfort.   Your illness is contagious and can be spread to others, especially during the first 3 or 4 days. It cannot be cured by antibiotics or other medicines. Take basic precautions such as washing your hands often, covering your mouth when you cough or sneeze, and avoiding public places where you could spread your illness to others.   Please continue drinking plenty of fluids.  Use over-the-counter medicines as needed as directed on packaging for symptom  relief.  You may also use ibuprofen or tylenol as directed on packaging for pain or fever.  Do not take multiple medicines containing Tylenol or acetaminophen to avoid taking too much of this medication.  Follow-up instructions: Please follow-up with your primary care provider in the next 3 days for further evaluation of your symptoms if you are not feeling better.   Return instructions:  Please return to the Emergency Department if you experience worsening symptoms.  RETURN IMMEDIATELY IF you develop shortness of breath, confusion or altered mental status, a new rash, become dizzy, faint, or poorly responsive, or are unable to be cared for at home. Please return if you have persistent vomiting and cannot keep down fluids or develop a fever that is not controlled by tylenol or motrin.   Please return if you have any other emergent concerns.  Additional Information:  Your vital signs today were: BP 120/68 (BP Location: Left Arm)   Pulse 87   Temp 98.5 F (36.9 C) (Oral)   Resp 20   Ht 5\' 2"  (1.575 m)   Wt 72.1 kg   SpO2 99%   BMI 29.08 kg/m  If your blood pressure (BP) was elevated above 135/85 this visit, please have this repeated by your doctor within one month. --------------

## 2023-03-04 NOTE — ED Triage Notes (Signed)
 Pt with cough, congestion since Thurs

## 2023-03-04 NOTE — ED Provider Notes (Signed)
 [place] [facility] Provider Note   CSN: [id number] Arrival date & time: 03/04/23  2048     History  Chief Complaint  Patient presents with   Cough    Kristie Nelson is a 43 y.o. female.  Patient presents to the emergency department for evaluation of nasal congestion, sore throat, runny nose, and cough ongoing over the past 48 hours.  Patient denies fever.  No ear pain.  No nausea, vomiting, or diarrhea.  She denies history of underlying asthma or other lung problems.  No lower extremity swelling.  No known sick contacts.  She has been taking over-the-counter sinus medication at home.       Home Medications Prior to Admission medications   Medication Sig Start Date End Date Taking? Authorizing Provider  fluticasone (FLONASE) 50 MCG/ACT nasal spray Place 2 sprays into both nostrils daily. 03/04/23  Yes Renne Crigler, PA-C  loratadine (CLARITIN) 10 MG tablet Take 1 tablet (10 mg total) by mouth daily. 03/04/23  Yes Renne Crigler, PA-C  pseudoephedrine (SUDAFED) 30 MG tablet Take 1 tablet (30 mg total) by mouth every 4 (four) hours as needed for congestion. 03/04/23  Yes Renne Crigler, PA-C  carbamazepine (TEGRETOL XR) 100 MG 12 hr tablet Take 1 pill in AM and 2 pills in PM for one week, then increase to 2 pills twice a day 02/23/22   Ocie Doyne, MD  carvedilol (COREG) 12.5 MG tablet Take 12.5 mg by mouth 2 (two) times daily. 06/17/21   [provider]  clonazePAM (KLONOPIN) 1 MG tablet TAKE 1 TABLET BY MOUTH 2 TIMES DAILY AS NEEDED FOR ANXIETY. 07/18/22   Corwin Levins, MD  cyclobenzaprine (FLEXERIL) 5 MG tablet Take 1 tablet (5 mg total) by mouth 3 (three) times daily as needed. 02/02/23   Corwin Levins, MD  escitalopram (LEXAPRO) 20 MG tablet Take 1 tablet (20 mg total) by mouth daily. 09/02/22   Corwin Levins, MD  methocarbamol (ROBAXIN) 500 MG tablet Take 1 tablet (500 mg total) by mouth every 8 (eight) hours as needed for muscle spasms.  05/02/22   Gwenlyn Fudge, FNP  mometasone (ELOCON) 0.1 % ointment Apply topically daily. 07/19/18   Marcelyn Bruins, MD  nitroGLYCERIN (NITROSTAT) 0.4 MG SL tablet Place 1 tablet (0.4 mg total) under the tongue every 5 (five) minutes as needed for chest pain. Patient not taking: Reported on 05/02/2022 07/25/21   Sherian Maroon A, PA  pimecrolimus (ELIDEL) 1 % cream Apply topically 2 (two) times daily. 07/19/18   Marcelyn Bruins, MD  Rimegepant Sulfate (NURTEC) 75 MG TBDP Take 1 tablet (75 mg total) by mouth daily as needed. 04/06/22   Georgina Quint, MD  rosuvastatin (CRESTOR) 10 MG tablet TAKE 1 TABLET BY MOUTH EVERY DAY 08/08/22   Corwin Levins, MD  spironolactone (ALDACTONE) 50 MG tablet Take 50 mg by mouth daily.    [provider]  triamcinolone cream (KENALOG) 0.1 % Apply 1 application topically 2 (two) times daily. 03/26/18   Corwin Levins, MD      Allergies    Dog epithelium, Latex, Peanut-containing drug products, Penicillins, and Tomato (diagnostic)    Review of Systems   Review of Systems  Physical Exam Updated Vital Signs BP 120/68 (BP Location: Left Arm)   Pulse 87   Temp 98.5 F (36.9 C) (Oral)   Resp 20   Ht 5\' 2"  (1.575 m)   Wt 72.1 kg   SpO2  99%   BMI 29.08 kg/m  Physical Exam Vitals and nursing note reviewed.  Constitutional:      Appearance: She is well-developed.  HENT:     Head: Normocephalic and atraumatic.     Jaw: No trismus.     Right Ear: Tympanic membrane, ear canal and external ear normal.     Left Ear: Tympanic membrane, ear canal and external ear normal.     Nose: Congestion and rhinorrhea present. No mucosal edema.     Mouth/Throat:     Mouth: Mucous membranes are moist. Mucous membranes are not dry. No oral lesions.     Pharynx: Uvula midline. No oropharyngeal exudate, posterior oropharyngeal erythema or uvula swelling.     Tonsils: No tonsillar abscesses.  Eyes:     General:        Right eye: No discharge.         Left eye: No discharge.     Conjunctiva/sclera: Conjunctivae normal.  Cardiovascular:     Rate and Rhythm: Normal rate and regular rhythm.     Heart sounds: Normal heart sounds.  Pulmonary:     Effort: Pulmonary effort is normal. No respiratory distress.     Breath sounds: Normal breath sounds. No wheezing or rales.  Abdominal:     Palpations: Abdomen is soft.     Tenderness: There is no abdominal tenderness.  Musculoskeletal:     Cervical back: Normal range of motion and neck supple.  Lymphadenopathy:     Cervical: No cervical adenopathy.  Skin:    General: Skin is warm and dry.  Neurological:     Mental Status: She is alert.  Psychiatric:        Mood and Affect: Mood normal.     ED Results / Procedures / Treatments   Labs (all labs ordered are listed, but only abnormal results are displayed) Labs Reviewed  RESP PANEL BY RT-PCR (RSV, FLU A&B, COVID)  [id number]    EKG None  Radiology No results found.  Procedures Procedures    Medications Ordered in ED Medications - No data to display  ED Course/ Medical Decision Making/ A&P    Patient seen and examined. History obtained directly from patient. Work-up including labs, imaging, EKG ordered in triage, if performed, were reviewed.    Labs/EKG: Independently reviewed and interpreted.  This included: Viral panel negative  Imaging: None ordered  Medications/Fluids: None ordered  Most recent vital signs reviewed and are as follows: BP 120/68 (BP Location: Left Arm)   Pulse 87   Temp 98.5 F (36.9 C) (Oral)   Resp 20   Ht 5\' 2"  (1.575 m)   Wt 72.1 kg   SpO2 99%   BMI 29.08 kg/m   Initial impression: Viral URI  Home treatment plan: OTC meds.  Prescription given for Flonase, loratadine, Sudafed.  Return instructions discussed with patient: New or worsening symptoms  Follow-up instructions discussed with patient: PCP in 1 week if not improved                                Medical Decision  Making Risk OTC drugs.   Patient with symptoms consistent with a viral syndrome. Vitals are stable, no fever. No signs of dehydration. Lung exam normal, no signs of pneumonia. Supportive therapy indicated with return if symptoms worsen.           Final Clinical Impression(s) / ED Diagnoses Final diagnoses:  Viral URI  with cough    Rx / DC Orders ED Discharge Orders          Ordered    fluticasone (FLONASE) 50 MCG/ACT nasal spray  Daily        03/04/23 2234    pseudoephedrine (SUDAFED) 30 MG tablet  Every 4 hours PRN        03/04/23 2234    loratadine (CLARITIN) 10 MG tablet  Daily        03/04/23 2234              Renne Crigler, PA-C 03/04/23 2238    Sloan Leiter, DO 03/05/23 2244

## 2023-03-06 ENCOUNTER — Ambulatory Visit (INDEPENDENT_AMBULATORY_CARE_PROVIDER_SITE_OTHER): Admitting: Emergency Medicine

## 2023-03-06 ENCOUNTER — Ambulatory Visit (INDEPENDENT_AMBULATORY_CARE_PROVIDER_SITE_OTHER)

## 2023-03-06 ENCOUNTER — Ambulatory Visit: Payer: Self-pay | Admitting: Internal Medicine

## 2023-03-06 ENCOUNTER — Encounter: Payer: Self-pay | Admitting: Emergency Medicine

## 2023-03-06 VITALS — BP 118/78 | HR 83 | Temp 98.5°F | Ht 62.0 in | Wt 163.0 lb

## 2023-03-06 DIAGNOSIS — J22 Unspecified acute lower respiratory infection: Secondary | ICD-10-CM | POA: Diagnosis not present

## 2023-03-06 DIAGNOSIS — R051 Acute cough: Secondary | ICD-10-CM | POA: Diagnosis not present

## 2023-03-06 DIAGNOSIS — R6889 Other general symptoms and signs: Secondary | ICD-10-CM | POA: Insufficient documentation

## 2023-03-06 LAB — CBC WITH DIFFERENTIAL/PLATELET
Basophils Absolute: 0.1 10*3/uL (ref 0.0–0.1)
Basophils Relative: 0.9 % (ref 0.0–3.0)
Eosinophils Absolute: 0.4 10*3/uL (ref 0.0–0.7)
Eosinophils Relative: 5.7 % — ABNORMAL HIGH (ref 0.0–5.0)
HCT: 37.9 % (ref 36.0–46.0)
Hemoglobin: 12.4 g/dL (ref 12.0–15.0)
Lymphocytes Relative: 22.8 % (ref 12.0–46.0)
Lymphs Abs: 1.8 10*3/uL (ref 0.7–4.0)
MCHC: 32.6 g/dL (ref 30.0–36.0)
MCV: 96.7 fl (ref 78.0–100.0)
Monocytes Absolute: 0.8 10*3/uL (ref 0.1–1.0)
Monocytes Relative: 10.5 % (ref 3.0–12.0)
Neutro Abs: 4.7 10*3/uL (ref 1.4–7.7)
Neutrophils Relative %: 60.1 % (ref 43.0–77.0)
Platelets: 243 10*3/uL (ref 150.0–400.0)
RBC: 3.92 Mil/uL (ref 3.87–5.11)
RDW: 13.9 % (ref 11.5–15.5)
WBC: 7.8 10*3/uL (ref 4.0–10.5)

## 2023-03-06 MED ORDER — HYDROCODONE BIT-HOMATROP MBR 5-1.5 MG/5ML PO SOLN: 5.0000 mL | Freq: Every evening | ORAL | 0 refills | Status: DC | PRN

## 2023-03-06 MED ORDER — AZITHROMYCIN 250 MG PO TABS: ORAL_TABLET | ORAL | 0 refills | Status: DC

## 2023-03-06 MED ORDER — BENZONATATE 200 MG PO CAPS: 200.0000 mg | ORAL_CAPSULE | Freq: Two times a day (BID) | ORAL | 0 refills | Status: DC | PRN

## 2023-03-06 NOTE — Assessment & Plan Note (Signed)
 Symptom management discussed Cough management as above Advised to rest and stay well-hydrated Tylenol and or Advil for headaches and generalized aches Advised to contact the office if no better or worse during the next several days

## 2023-03-06 NOTE — Assessment & Plan Note (Signed)
 Clinically stable.  No red flag signs or symptoms No physical findings of pneumonia Recent exposure to 45 year old daughter who has bronchitis Chest x-ray done today.  Report reviewed. Recommend daily azithromycin for 5 days Advised to rest and stay well-hydrated Empty management discussed ED precautions given Advised to contact the office if no better or worse during the next several days

## 2023-03-06 NOTE — Assessment & Plan Note (Signed)
 Cough management discussed Advised to take over-the-counter Mucinex DM and cough drops Advised to stay well-hydrated and rest Tessalon 200 mg 3 times a day and Hycodan syrup at bedtime

## 2023-03-06 NOTE — Telephone Encounter (Signed)
 Copied from CRM [id number]. Topic: Clinical - Red Word Triage >> Mar 06, 2023  8:15 AM Mackie Pai E wrote: [facility] that prompted transfer to Nurse Triage: Patient has had a worsening cough since 2/27. Patient was in the ED on March 1st and she is not improving. Patient experiencing a cough, headache, congestion, and some phelgm comes up when she coughs.   Chief Complaint: Cough Symptoms: Cough, congestion, headache Pertinent Negatives: Patient denies fever Disposition: [] ED /[] Urgent Care (no appt availability in office) / [x] Appointment(In office/virtual)/ []  [facility]/ [] Home Care/ [] Refused Recommended Disposition /[] [facility]/ []  Follow-up with PCP Additional Notes: Patient has an URI and was seen in ED on 3/1. She stated she is having a worsening cough since then and has been using Sudafed, Claritin, and nasal spray without relief. Appointment scheduled for this afternoon.   Reason for Disposition  [1] Continuous (nonstop) coughing interferes with work or school AND [2] no improvement using cough treatment per Care Advice  Answer Assessment - Initial Assessment Questions 1. COUGH: "Do you have a cough?" If Yes, ask: "Describe the color of your sputum" (clear, white, yellow, green)     Yes, yellow sputum   2. RESPIRATORY DISTRESS: "Describe your breathing."      Mild SOB due to congestion and coughing   3. FEVER: "Do you have a fever?" If Yes, ask: "What is your temperature, how was it measured, and when did it start?"     No  4. SEVERITY: "Overall, how bad are you feeling right now?" (e.g., doesn't interfere with normal activities, staying home from school/work, staying in bed)      Patient stated it interferes with activity  5. OTHER SYMPTOMS: "Do you have any other symptoms?" (e.g., sore throat, earache, wheezing, vomiting)     Congestion, worsening cough, headache  Answer Assessment - Initial Assessment Questions 1. SEVERITY: "How bad is the cough  today?"      Interfering with daily activity  2. SPUTUM: "Describe the color of your sputum" (none, dry cough; clear, white, yellow, green)     Yellow  3. HEMOPTYSIS: "Are you coughing up any blood?" If so ask: "How much?" (flecks, streaks, tablespoons, etc.)     N/A  4. DIFFICULTY BREATHING: "Are you having difficulty breathing?" If Yes, ask: "How bad is it?" (e.g., mild, moderate, severe)    - MILD: No SOB at rest, mild SOB with walking, speaks normally in sentences, can lie down, no retractions, pulse < 100.    - MODERATE: SOB at rest, SOB with minimal exertion and prefers to sit, cannot lie down flat, speaks in phrases, mild retractions, audible wheezing, pulse 100-120.    - SEVERE: Very SOB at rest, speaks in single words, struggling to breathe, sitting hunched forward, retractions, pulse > 120      No   5. FEVER: "Do you have a fever?" If Yes, ask: "What is your temperature, how was it measured, and when did it start?"     No  Protocols used: Common Cold-A-AH, Cough - Acute Productive-A-AH

## 2023-03-06 NOTE — Patient Instructions (Signed)
 Acute Bronchitis, Adult  Acute bronchitis is when air tubes in the lungs (bronchi) suddenly get swollen. The condition can make it hard for you to breathe. In adults, acute bronchitis usually goes away within 2 weeks. A cough caused by bronchitis may last up to 3 weeks. Smoking, allergies, and asthma can make the condition worse. What are the causes? Germs that cause cold and flu (viruses). The most common cause of this condition is the virus that causes the common cold. Bacteria. Substances that bother (irritate) the lungs, including: Smoke from cigarettes and other types of tobacco. Dust and pollen. Fumes from chemicals, gases, or burned fuel. Indoor or outdoor air pollution. What increases the risk? A weak body's defense system. This is also called the immune system. Any condition that affects your lungs and breathing, such as asthma. What are the signs or symptoms? A cough. Coughing up clear, yellow, or green mucus. Making high-pitched whistling sounds when you breathe, most often when you breathe out (wheezing). Runny or stuffy nose. Having too much mucus in your lungs (chest congestion). Shortness of breath. Body aches. A sore throat. How is this treated? Acute bronchitis may go away over time without treatment. Your doctor may tell you to: Drink more fluids. This will help thin your mucus so it is easier to cough up. Use a device that gets medicine into your lungs (inhaler). Use a vaporizer or a humidifier. These are machines that add water to the air. This helps with coughing and poor breathing. Take a medicine that thins mucus and helps clear it from your lungs. Take a medicine that prevents or stops coughing. It is not common to take an antibiotic medicine for this condition. Follow these instructions at home:  Take over-the-counter and prescription medicines only as told by your doctor. Use an inhaler, vaporizer, or humidifier as told by your doctor. Take two teaspoons  (10 mL) of honey at bedtime. This helps lessen your coughing at night. Drink enough fluid to keep your pee (urine) pale yellow. Do not smoke or use any products that contain nicotine or tobacco. If you need help quitting, ask your doctor. Get a lot of rest. Return to your normal activities when your doctor says that it is safe. Keep all follow-up visits. How is this prevented?  Wash your hands often with soap and water for at least 20 seconds. If you cannot use soap and water, use hand sanitizer. Avoid contact with people who have cold symptoms. Try not to touch your mouth, nose, or eyes with your hands. Avoid breathing in smoke or chemical fumes. Make sure to get the flu shot every year. Contact a doctor if: Your symptoms do not get better in 2 weeks. You have trouble coughing up the mucus. Your cough keeps you awake at night. You have a fever. Get help right away if: You cough up blood. You have chest pain. You have very bad shortness of breath. You faint or keep feeling like you are going to faint. You have a very bad headache. Your fever or chills get worse. These symptoms may be an emergency. Get help right away. Call your local emergency services (911 in the [place] Do not wait to see if the symptoms will go away. Do not drive yourself to the hospital. Summary Acute bronchitis is when air tubes in the lungs (bronchi) suddenly get swollen. In adults, acute bronchitis usually goes away within 2 weeks. Drink more fluids. This will help thin your mucus so it is easier  to cough up. Take over-the-counter and prescription medicines only as told by your doctor. Contact a doctor if your symptoms do not improve after 2 weeks of treatment. This information is not intended to replace advice given to you by your health care provider. Make sure you discuss any questions you have with your health care provider. Document Revised: 04/22/2020 Document Reviewed: 04/22/2020 Elsevier Patient  Education  2024 [employer].

## 2023-03-06 NOTE — Progress Notes (Signed)
 Kristie Nelson 43 y.o.   Chief Complaint  Patient presents with   Hospitalization Follow-up    Patient states going to the ED on 03/04/23 for the URI and feels worse today. Cough started Saturday and has gotten worse. Sudafed, Flonase and zyrtec and none of them are working for her. No fever. Was tested in the hospital for COVID, FLU, and RSV and they were all negative     HISTORY OF PRESENT ILLNESS: Acute problem visit.  Patient of Dr. Oliver Barre. This is a 43 y.o. female here for follow-up of emergency department visit on 03/04/2023 when she presented with flulike symptoms Tested negative for flu, COVID, and RSV Not feeling better.  Still having productive cough.  HPI   Prior to Admission medications   Medication Sig Start Date End Date Taking? Authorizing Provider  carbamazepine (TEGRETOL XR) 100 MG 12 hr tablet Take 1 pill in AM and 2 pills in PM for one week, then increase to 2 pills twice a day 02/23/22  Yes Chima, Victorino Dike, MD  carvedilol (COREG) 12.5 MG tablet Take 12.5 mg by mouth 2 (two) times daily. 06/17/21  Yes [provider]  clonazePAM (KLONOPIN) 1 MG tablet TAKE 1 TABLET BY MOUTH 2 TIMES DAILY AS NEEDED FOR ANXIETY. 07/18/22  Yes Corwin Levins, MD  escitalopram (LEXAPRO) 20 MG tablet Take 1 tablet (20 mg total) by mouth daily. 09/02/22  Yes Corwin Levins, MD  fluticasone [facility]) 50 MCG/ACT nasal spray Place 2 sprays into both nostrils daily. 03/04/23  Yes Renne Crigler, PA-C  methocarbamol (ROBAXIN) 500 MG tablet Take 1 tablet (500 mg total) by mouth every 8 (eight) hours as needed for muscle spasms. 05/02/22  Yes Deliah Boston F, FNP  mometasone (ELOCON) 0.1 % ointment Apply topically daily. 07/19/18  Yes Padgett, Pilar Grammes, MD  pimecrolimus (ELIDEL) 1 % cream Apply topically 2 (two) times daily. 07/19/18  Yes Padgett, Pilar Grammes, MD  Rimegepant Sulfate (NURTEC) 75 MG TBDP Take 1 tablet (75 mg total) by mouth daily as needed. 04/06/22  Yes Georgina Quint, MD  rosuvastatin (CRESTOR) 10 MG tablet TAKE 1 TABLET BY MOUTH EVERY DAY 08/08/22  Yes Corwin Levins, MD  spironolactone (ALDACTONE) 50 MG tablet Take 50 mg by mouth daily.   Yes [provider]  triamcinolone cream (KENALOG) 0.1 % Apply 1 application topically 2 (two) times daily. 03/26/18  Yes Corwin Levins, MD  cyclobenzaprine (FLEXERIL) 5 MG tablet Take 1 tablet (5 mg total) by mouth 3 (three) times daily as needed. Patient not taking: Reported on 03/06/2023 02/02/23   Corwin Levins, MD  loratadine (CLARITIN) 10 MG tablet Take 1 tablet (10 mg total) by mouth daily. Patient not taking: Reported on 03/06/2023 03/04/23   Renne Crigler, PA-C  nitroGLYCERIN (NITROSTAT) 0.4 MG SL tablet Place 1 tablet (0.4 mg total) under the tongue every 5 (five) minutes as needed for chest pain. Patient not taking: Reported on 03/06/2023 07/25/21   Sherian Maroon A, PA  pseudoephedrine (SUDAFED) 30 MG tablet Take 1 tablet (30 mg total) by mouth every 4 (four) hours as needed for congestion. Patient not taking: Reported on 03/06/2023 03/04/23   Renne Crigler, PA-C    Allergies  Allergen Reactions   Dog Epithelium Itching   Latex Itching    swelling   Peanut-Containing Drug Products     REACTION: throat swelling   Penicillins     REACTION: itching   Tomato (Diagnostic) Itching and Swelling  Patient Active Problem List   Diagnosis Date Noted   Intractable migraine with status migrainosus 04/06/2022   Allergic rhinoconjunctivitis 10/11/2021   Mild mitral regurgitation by prior echocardiogram 07/31/2021   Prediabetes 11/15/2020   Acute cough 11/02/2020   Aortic atherosclerosis (HCC) 09/27/2020   Abnormal thyroid exam 09/24/2020   Cephalalgia 09/19/2020   Left ear hearing loss 09/19/2020   Chest pain 08/08/2020   Acute upper back pain 08/06/2020   Sinusitis 04/18/2020   Viral illness 10/08/2019   Abdominal wall pain in both upper quadrants 12/12/2018   Patellofemoral arthralgia of both knees  11/15/2018   Left knee pain 09/28/2018   Palpitations 07/10/2017   Upper respiratory tract infection 02/20/2017   Thrombosed external hemorrhoid 12/29/2016   Menstrual periods irregular 10/06/2016   Hypokalemia 10/06/2016   Hemorrhoids 10/19/2015   Low back pain 09/14/2015   Allergic conjunctivitis 01/21/2015   Low back strain 05/14/2014   Chronic constipation 03/26/2013   Abdominal pain 03/26/2013   Amenorrhea 02/18/2011   Encounter for well adult exam with abnormal findings 09/27/2010   Hyperlipidemia 12/09/2009   MIGRAINE, COMMON 12/09/2009   GANGLION CYST, WRIST, LEFT 12/09/2009   Eczema 03/19/2009   PARESTHESIA 09/23/2008   Anxiety state 04/03/2007   Depression 04/03/2007   Essential hypertension 04/03/2007   Allergic rhinitis 04/03/2007    Past Medical History:  Diagnosis Date   ALLERGIC RHINITIS 04/03/2007   Anal fissure    ANXIETY 04/03/2007   DEPRESSION 04/03/2007   Dysrhythmia    palpitations due to stress   ECZEMA 03/19/2009   Heartburn in pregnancy    Hemorrhoids    HYPERLIPIDEMIA 12/09/2009   HYPERTENSION 04/03/2007   MIGRAINE, COMMON 12/09/2009   Postoperative anemia due to acute blood loss 08/30/2012   Shortness of breath    with anxiety    Past Surgical History:  Procedure Laterality Date   CESAREAN SECTION  2010   CESAREAN SECTION N/A 08/29/2012   Procedure: Repeat CESAREAN SECTION;  Surgeon: Lenoard Aden, MD;  Location: [facility];  Service: Obstetrics;  Laterality: N/A;  EDD: 09/17/12;REQUEST DEE;[name]    Social History   Socioeconomic History   Marital status: Married    Spouse name: Not on file   Number of children: 2   Years of education: Masters   Highest education level: Not on file  Occupational History   Occupation: TEACHER    Employer: [employer]  Tobacco Use   Smoking status: Never   Smokeless tobacco: Never  Vaping Use   Vaping status: Never Used  Substance and Sexual Activity   Alcohol use: No    Alcohol/week: 0.0  standard drinks of alcohol   Drug use: No   Sexual activity: Not on file  Other Topics Concern   Not on file  Social History Narrative   Right handed   Caffeine use: starbucks drink once weekly   Kindergarten teacher - [employer]   Married, 1 son 2010 and 1 daughter 2014   02/25/2016   Social Drivers of Corporate investment banker Strain: Not on file  Food Insecurity: Not on file  Transportation Needs: Not on file  Physical Activity: Not on file  Stress: Not on file  Social Connections: Not on file  Intimate Partner Violence: Not on file    Family History  Problem Relation Age of Onset   Diabetes Mother    Hypertension Mother    Heart disease Mother    Hyperlipidemia Mother    Diabetes Father  Hypertension Father    Diabetes Brother    Hypertension Brother    Diabetes Maternal Grandmother    Hypertension Maternal Grandmother    Thyroid disease Maternal Grandmother    Hyperlipidemia Maternal Grandmother    Liver cancer Maternal Grandfather        mets   Hypertension Maternal Grandfather    Other Neg Hx        hyperaldosteronism     Review of Systems  Constitutional: Negative.  Negative for chills and fever.  HENT:  Positive for congestion. Negative for sore throat.   Respiratory:  Positive for cough and sputum production. Negative for shortness of breath.   Cardiovascular: Negative.  Negative for chest pain and palpitations.  Gastrointestinal: Negative.  Negative for abdominal pain, diarrhea, nausea and vomiting.  Genitourinary: Negative.  Negative for dysuria and hematuria.  Skin: Negative.  Negative for rash.  Neurological:  Positive for headaches. Negative for dizziness.  All other systems reviewed and are negative.   Vitals:   03/06/23 1508  BP: 118/78  Pulse: 83  Temp: 98.5 F (36.9 C)  SpO2: 97%    Physical Exam Vitals reviewed.  Constitutional:      Appearance: Normal appearance.  HENT:     Head: Normocephalic.     Mouth/Throat:      Mouth: Mucous membranes are moist.     Pharynx: Oropharynx is clear.  Eyes:     Extraocular Movements: Extraocular movements intact.     Conjunctiva/sclera: Conjunctivae normal.     Pupils: Pupils are equal, round, and reactive to light.  Cardiovascular:     Rate and Rhythm: Normal rate and regular rhythm.     Pulses: Normal pulses.     Heart sounds: Normal heart sounds.  Pulmonary:     Effort: Pulmonary effort is normal.     Breath sounds: Normal breath sounds.  Musculoskeletal:     Cervical back: No tenderness.  Lymphadenopathy:     Cervical: No cervical adenopathy.  Skin:    General: Skin is warm and dry.     Capillary Refill: Capillary refill takes less than 2 seconds.  Neurological:     General: No focal deficit present.     Mental Status: She is alert and oriented to person, place, and time.  Psychiatric:        Mood and Affect: Mood normal.        Behavior: Behavior normal.      ASSESSMENT & PLAN: A total of 32 minutes was spent with the patient and counseling/coordination of care regarding preparing for this visit, review of most recent office visit notes, review of multiple chronic medical conditions under management, review of all medications, diagnosis of lower respiratory infection and need for antibiotics, symptom management, cough management, prognosis, review of chest x-ray report done today, documentation and need for follow-up if no better or worse during the next several days  Problem List Items Addressed This Visit       Respiratory   Lower respiratory infection - Primary   Clinically stable.  No red flag signs or symptoms No physical findings of pneumonia Recent exposure to 20 year old daughter who has bronchitis Chest x-ray done today.  Report reviewed. Recommend daily azithromycin for 5 days Advised to rest and stay well-hydrated Empty management discussed ED precautions given Advised to contact the office if no better or worse during the next  several days      Relevant Medications   azithromycin (ZITHROMAX) 250 MG tablet   Other Relevant  Orders   CBC with Differential/Platelet   Comprehensive metabolic panel   DG Chest 2 View     Other   Acute cough   Cough management discussed Advised to take over-the-counter Mucinex DM and cough drops Advised to stay well-hydrated and rest Tessalon 200 mg 3 times a day and Hycodan syrup at bedtime      Relevant Medications   benzonatate (TESSALON) 200 MG capsule   HYDROcodone bit-homatropine (HYCODAN) 5-1.5 MG/5ML syrup   Flu-like symptoms   Symptom management discussed Cough management as above Advised to rest and stay well-hydrated Tylenol and or Advil for headaches and generalized aches Advised to contact the office if no better or worse during the next several days      Patient Instructions  Acute Bronchitis, Adult  Acute bronchitis is when air tubes in the lungs (bronchi) suddenly get swollen. The condition can make it hard for you to breathe. In adults, acute bronchitis usually goes away within 2 weeks. A cough caused by bronchitis may last up to 3 weeks. Smoking, allergies, and asthma can make the condition worse. What are the causes? Germs that cause cold and flu (viruses). The most common cause of this condition is the virus that causes the common cold. Bacteria. Substances that bother (irritate) the lungs, including: Smoke from cigarettes and other types of tobacco. Dust and pollen. Fumes from chemicals, gases, or burned fuel. Indoor or outdoor air pollution. What increases the risk? A weak body's defense system. This is also called the immune system. Any condition that affects your lungs and breathing, such as asthma. What are the signs or symptoms? A cough. Coughing up clear, yellow, or green mucus. Making high-pitched whistling sounds when you breathe, most often when you breathe out (wheezing). Runny or stuffy nose. Having too much mucus in your lungs  (chest congestion). Shortness of breath. Body aches. A sore throat. How is this treated? Acute bronchitis may go away over time without treatment. Your doctor may tell you to: Drink more fluids. This will help thin your mucus so it is easier to cough up. Use a device that gets medicine into your lungs (inhaler). Use a vaporizer or a humidifier. These are machines that add water to the air. This helps with coughing and poor breathing. Take a medicine that thins mucus and helps clear it from your lungs. Take a medicine that prevents or stops coughing. It is not common to take an antibiotic medicine for this condition. Follow these instructions at home:  Take over-the-counter and prescription medicines only as told by your doctor. Use an inhaler, vaporizer, or humidifier as told by your doctor. Take two teaspoons (10 mL) of honey at bedtime. This helps lessen your coughing at night. Drink enough fluid to keep your pee (urine) pale yellow. Do not smoke or use any products that contain nicotine or tobacco. If you need help quitting, ask your doctor. Get a lot of rest. Return to your normal activities when your doctor says that it is safe. Keep all follow-up visits. How is this prevented?  Wash your hands often with soap and water for at least 20 seconds. If you cannot use soap and water, use hand sanitizer. Avoid contact with people who have cold symptoms. Try not to touch your mouth, nose, or eyes with your hands. Avoid breathing in smoke or chemical fumes. Make sure to get the flu shot every year. Contact a doctor if: Your symptoms do not get better in 2 weeks. You have trouble coughing  up the mucus. Your cough keeps you awake at night. You have a fever. Get help right away if: You cough up blood. You have chest pain. You have very bad shortness of breath. You faint or keep feeling like you are going to faint. You have a very bad headache. Your fever or chills get worse. These  symptoms may be an emergency. Get help right away. Call your local emergency services (911 in the [place] Do not wait to see if the symptoms will go away. Do not drive yourself to the hospital. Summary Acute bronchitis is when air tubes in the lungs (bronchi) suddenly get swollen. In adults, acute bronchitis usually goes away within 2 weeks. Drink more fluids. This will help thin your mucus so it is easier to cough up. Take over-the-counter and prescription medicines only as told by your doctor. Contact a doctor if your symptoms do not improve after 2 weeks of treatment. This information is not intended to replace advice given to you by your health care provider. Make sure you discuss any questions you have with your health care provider. Document Revised: 04/22/2020 Document Reviewed: 04/22/2020 Elsevier Patient Education  2024 Elsevier Inc.    Edwina Barth, MD [facility]

## 2023-03-07 ENCOUNTER — Encounter: Payer: Self-pay | Admitting: Emergency Medicine

## 2023-03-07 LAB — COMPREHENSIVE METABOLIC PANEL
ALT: 16 U/L (ref 0–35)
AST: 18 U/L (ref 0–37)
Albumin: 4.2 g/dL (ref 3.5–5.2)
Alkaline Phosphatase: 47 U/L (ref 39–117)
BUN: 18 mg/dL (ref 6–23)
CO2: 27 meq/L (ref 19–32)
Calcium: 9.5 mg/dL (ref 8.4–10.5)
Chloride: 104 meq/L (ref 96–112)
Creatinine, Ser: 0.81 mg/dL (ref 0.40–1.20)
GFR: 87.94 mL/min (ref >60.00)
Glucose, Bld: 87 mg/dL (ref 70–99)
Potassium: 3.9 meq/L (ref 3.5–5.1)
Sodium: 137 meq/L (ref 135–145)
Total Bilirubin: 0.3 mg/dL (ref 0.2–1.2)
Total Protein: 7.4 g/dL (ref 6.0–8.3)

## 2023-03-13 ENCOUNTER — Encounter: Payer: Self-pay | Admitting: Internal Medicine

## 2023-03-21 ENCOUNTER — Encounter: Payer: Self-pay | Admitting: Internal Medicine

## 2023-03-21 DIAGNOSIS — M549 Dorsalgia, unspecified: Secondary | ICD-10-CM

## 2023-03-21 DIAGNOSIS — M542 Cervicalgia: Secondary | ICD-10-CM

## 2023-03-21 MED ORDER — MELOXICAM 15 MG PO TABS: 15.0000 mg | ORAL_TABLET | Freq: Every day | ORAL | 1 refills | Status: AC | PRN

## 2023-04-03 ENCOUNTER — Ambulatory Visit: Admitting: Emergency Medicine

## 2023-04-07 NOTE — Addendum Note (Signed)
 Addended by: Corwin Levins on: 04/07/2023 04:59 PM   Modules accepted: Orders

## 2023-04-11 ENCOUNTER — Emergency Department (HOSPITAL_BASED_OUTPATIENT_CLINIC_OR_DEPARTMENT_OTHER)
Admission: EM | Admit: 2023-04-11 | Discharge: 2023-04-11 | Disposition: A | Discharge status: Home / Self Care | Attending: Emergency Medicine | Admitting: Emergency Medicine

## 2023-04-11 ENCOUNTER — Other Ambulatory Visit: Payer: Self-pay

## 2023-04-11 ENCOUNTER — Encounter (HOSPITAL_BASED_OUTPATIENT_CLINIC_OR_DEPARTMENT_OTHER): Payer: Self-pay | Admitting: Emergency Medicine

## 2023-04-11 DIAGNOSIS — Z91010 Allergy to peanuts: Secondary | ICD-10-CM | POA: Insufficient documentation

## 2023-04-11 DIAGNOSIS — M546 Pain in thoracic spine: Secondary | ICD-10-CM | POA: Diagnosis present

## 2023-04-11 MED ORDER — OXYCODONE HCL 5 MG PO TABS: 5.0000 mg | ORAL_TABLET | Freq: Four times a day (QID) | ORAL | 0 refills | Status: DC | PRN

## 2023-04-11 MED ORDER — LIDOCAINE 5 % EX PTCH
1.0000 | MEDICATED_PATCH | Freq: Once | CUTANEOUS | Status: DC
  Administered 2023-04-11: 1 via TRANSDERMAL
  Filled 2023-04-11: qty 1

## 2023-04-11 MED ORDER — KETOROLAC TROMETHAMINE 30 MG/ML IJ SOLN
30.0000 mg | Freq: Once | INTRAMUSCULAR | Status: AC
  Administered 2023-04-11: 30 mg via INTRAMUSCULAR
  Filled 2023-04-11: qty 1

## 2023-04-11 MED ORDER — METHOCARBAMOL 500 MG PO TABS
1000.0000 mg | ORAL_TABLET | Freq: Once | ORAL | Status: AC
  Administered 2023-04-11: 1000 mg via ORAL
  Filled 2023-04-11: qty 2

## 2023-04-11 MED ORDER — OXYCODONE HCL 5 MG PO TABS
5.0000 mg | ORAL_TABLET | Freq: Once | ORAL | Status: AC
  Administered 2023-04-11: 5 mg via ORAL
  Filled 2023-04-11: qty 1

## 2023-04-11 MED ORDER — METHOCARBAMOL 500 MG PO TABS: 1000.0000 mg | ORAL_TABLET | Freq: Four times a day (QID) | ORAL | 0 refills | Status: DC

## 2023-04-11 MED ORDER — NAPROXEN 500 MG PO TABS: 500.0000 mg | ORAL_TABLET | Freq: Two times a day (BID) | ORAL | 0 refills | Status: AC

## 2023-04-11 NOTE — Discharge Instructions (Signed)
 Please read and follow all provided instructions.  Your diagnoses today include:  1. Acute midline thoracic back pain     Tests performed today include: Vital signs - see below for your results today  Medications prescribed:  Naproxen - anti-inflammatory pain medication Do not exceed 500mg  naproxen every 12 hours, take with food  You have been prescribed an anti-inflammatory medication or NSAID. Take with food. Take smallest effective dose for the shortest duration needed for your pain. Stop taking if you experience stomach pain or vomiting.   Meloxicam - anti-inflammatory pain medication  You have been prescribed an anti-inflammatory medication or NSAID. Take with food. Do not take aspirin, ibuprofen, or naproxen if taking this medication. Take smallest effective dose for the shortest duration needed for your pain. Stop taking if you experience stomach pain or vomiting.   Oxycodone - narcotic pain medication, small amount prescribed if you have attacks of severe pain  DO NOT drive or perform any activities that require you to be awake and alert because this medicine can make you drowsy.   Take any prescribed medications only as directed.  Home care instructions:  Follow any educational materials contained in this packet Please rest, use ice or heat on your back for the next several days Do not lift, push, pull anything more than 10 pounds for the next week  Follow-up instructions: Please follow-up with your primary care provider in the next 1 week for further evaluation of your symptoms.   Return instructions:  SEEK IMMEDIATE MEDICAL ATTENTION IF YOU HAVE: New numbness, tingling, weakness, or problem with the use of your arms or legs Severe back pain not relieved with medications Loss control of your bowels or bladder Increasing pain in any areas of the body (such as chest or abdominal pain) Shortness of breath, dizziness, or fainting.  Worsening nausea (feeling sick to your  stomach), vomiting, fever, or sweats Any other emergent concerns regarding your health   Additional Information:  Your vital signs today were: BP 128/76   Pulse 67   Temp 97.6 F (36.4 C)   Resp 16   Wt 72.6 kg   SpO2 100%   BMI 29.26 kg/m  If your blood pressure (BP) was elevated above 135/85 this visit, please have this repeated by your doctor within one month. --------------

## 2023-04-11 NOTE — ED Notes (Signed)
 Discharge instructions reviewed with patient. Patient verbalizes understanding, no further questions at this time. Medications/prescriptions and follow up information provided. No acute distress noted at time of departure.

## 2023-04-11 NOTE — ED Provider Notes (Signed)
 [facility] Provider Note   CSN: [id number] Arrival date & time: 04/11/23  5366     History  Chief Complaint  Patient presents with   Back Pain    Kristie Nelson is a 43 y.o. female.  Patient presents to the emergency department today for evaluation of acute onset of thoracic back pain, across the bilateral back, starting this morning.  Patient states that she was bent over making a bed.  She had immediate pain across her back which radiated up into her neck and into her chest.  Pain is worse with movement.  No associated shortness of breath.  No lightheadedness or syncope.  No abdominal pain.  Patient states that she took meloxicam that she had at home.  Pain persisted, prompting emergency department visit.  No weakness, numbness, or tingling in the arms of the legs.  She continues to be ambulatory.  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        Home Medications Prior to Admission medications   Medication Sig Start Date End Date Taking? Authorizing Provider  azithromycin (ZITHROMAX) 250 MG tablet Sig as indicated 03/06/23   Elvira Hammersmith, MD  benzonatate (TESSALON) 200 MG capsule Take 1 capsule (200 mg total) by mouth 2 (two) times daily as needed for cough. 03/06/23   Sagardia, Miguel Jose, MD  carbamazepine (TEGRETOL XR) 100 MG 12 hr tablet Take 1 pill in AM and 2 pills in PM for one week, then increase to 2 pills twice a day 02/23/22   Dala Dublin, MD  carvedilol (COREG) 12.5 MG tablet Take 12.5 mg by mouth 2 (two) times daily. 06/17/21   [provider]  clonazePAM (KLONOPIN) 1 MG tablet TAKE 1 TABLET BY MOUTH 2 TIMES DAILY AS NEEDED FOR ANXIETY. 07/18/22   Roslyn Coombe, MD  cyclobenzaprine (FLEXERIL) 5 MG tablet Take 1 tablet (5 mg total) by mouth 3 (three) times daily as needed. Patient not taking: Reported on 03/06/2023 02/02/23   Roslyn Coombe, MD   escitalopram (LEXAPRO) 20 MG tablet Take 1 tablet (20 mg total) by mouth daily. 09/02/22   Roslyn Coombe, MD  fluticasone (FLONASE) 50 MCG/ACT nasal spray Place 2 sprays into both nostrils daily. 03/04/23   Lyna Sandhoff, PA-C  HYDROcodone bit-homatropine (HYCODAN) 5-1.5 MG/5ML syrup Take 5 mLs by mouth at bedtime as needed for cough. 03/06/23   Sagardia, Miguel Jose, MD  loratadine (CLARITIN) 10 MG tablet Take 1 tablet (10 mg total) by mouth daily. Patient not taking: Reported on 03/06/2023 03/04/23   Lyna Sandhoff, PA-C  meloxicam (MOBIC) 15 MG tablet Take 1 tablet (15 mg total) by mouth daily as needed for pain. 03/21/23   Roslyn Coombe, MD  methocarbamol (ROBAXIN) 500 MG tablet Take 1 tablet (500 mg total) by mouth every 8 (eight) hours as needed for muscle spasms. 05/02/22   Zorita Hiss, FNP  mometasone (ELOCON) 0.1 % ointment Apply topically daily. 07/19/18   [name] Campanile, MD  nitroGLYCERIN (NITROSTAT) 0.4 MG SL tablet Place 1 tablet (0.4 mg total) under the tongue every 5 (five) minutes as needed for chest pain. Patient not taking: Reported on 03/06/2023 07/25/21   Neil Balls A, PA  pimecrolimus (ELIDEL) 1 % cream Apply topically 2 (two) times daily. 07/19/18   [name] Campanile, MD  pseudoephedrine (SUDAFED) 30 MG tablet Take 1 tablet (30 mg total) by mouth every  4 (four) hours as needed for congestion. Patient not taking: Reported on 03/06/2023 03/04/23   Lyna Sandhoff, PA-C  Rimegepant Sulfate (NURTEC) 75 MG TBDP Take 1 tablet (75 mg total) by mouth daily as needed. 04/06/22   Sagardia, Miguel Jose, MD  rosuvastatin (CRESTOR) 10 MG tablet TAKE 1 TABLET BY MOUTH EVERY DAY 08/08/22   Roslyn Coombe, MD  spironolactone (ALDACTONE) 50 MG tablet Take 50 mg by mouth daily.    [provider]  triamcinolone cream (KENALOG) 0.1 % Apply 1 application topically 2 (two) times daily. 03/26/18   Roslyn Coombe, MD      Allergies    Dog epithelium, Latex, Peanut-containing drug  products, Penicillins, and Tomato (diagnostic)    Review of Systems   Review of Systems  Physical Exam Updated Vital Signs BP 128/76   Pulse 67   Temp 97.6 F (36.4 C)   Resp 16   Wt 72.6 kg   SpO2 100%   BMI 29.26 kg/m   Physical Exam Vitals and nursing note reviewed.  Constitutional:      Appearance: She is well-developed.  HENT:     Head: Normocephalic and atraumatic.  Eyes:     Conjunctiva/sclera: Conjunctivae normal.  Cardiovascular:     Pulses:          Radial pulses are 2+ on the right side and 2+ on the left side.     Comments: Radial pulses present bilaterally. Pulmonary:     Effort: Pulmonary effort is normal.  Abdominal:     Palpations: Abdomen is soft.     Tenderness: There is no abdominal tenderness.  Musculoskeletal:        General: Normal range of motion.     Right shoulder: Tenderness present. Normal range of motion.     Left shoulder: Tenderness present. Normal range of motion.     Cervical back: Normal range of motion and neck supple. No tenderness. Normal range of motion.     Thoracic back: Tenderness present. No spasms. Normal range of motion.     Lumbar back: No spasms or bony tenderness.       Back:     Comments: No step-off noted with palpation of spine.  Patient is very tender to palpation over the mid upper thoracic spine, bilaterally, overlying the scapular areas.  She leans up and forward in bed very slowly.  Skin:    General: Skin is warm and dry.     Findings: No rash.  Neurological:     Mental Status: She is alert.     Sensory: No sensory deficit.     Comments: 5/5 strength in entire lower extremities bilaterally. No sensation deficit.      ED Results / Procedures / Treatments   Labs (all labs ordered are listed, but only abnormal results are displayed) Labs Reviewed - No data to display  EKG None  Radiology No results found.  Procedures Procedures    Medications Ordered in ED Medications  lidocaine (LIDODERM) 5 % 1  patch (1 patch Transdermal Patch Applied 04/11/23 0928)  oxyCODONE (Oxy IR/ROXICODONE) immediate release tablet 5 mg (has no administration in time range)  ketorolac (TORADOL) 30 MG/ML injection 30 mg (30 mg Intramuscular Given 04/11/23 0924)  methocarbamol (ROBAXIN) tablet 1,000 mg (1,000 mg Oral Given 04/11/23 1610)    ED Course/ Medical Decision Making/ A&P    Patient seen and examined. History obtained directly from patient.   Labs/EKG: None ordered  Imaging: None ordered   Medications/Fluids:  IM toradol, lidoderm patch, PO robaxin -- will reassess.    Most recent vital signs reviewed and are as follows: BP 128/76   Pulse 67   Temp 97.6 F (36.4 C)   Resp 16   Wt 72.6 kg   SpO2 100%   BMI 29.26 kg/m   Initial impression: Musculoskeletal thoracic back pain as pain is reproducible with movement and based on history.  Low concern for aortic dissection, pneumothorax, PE, ACS based on presentation.  Will reassess.  Patient and friend at bedside did ask about whether the patient needed an MRI.  Discussed no emergent alarm symptoms which would necessitate this.  Discussed that if symptoms do not respond after several weeks with routine therapy or become recurrent over time, may follow-up on this through PCP or orthopedics.  10:47 AM reassessment performed.  Pain is only down a slight amount.  I reexamined the patient, she continues to have midline tenderness in the thoracic spine reproduced with palpation.  No abdominal tenderness.  Most recent vital signs reviewed and are as follows: BP 128/76   Pulse 67   Temp 97.6 F (36.4 C)   Resp 16   Wt 72.6 kg   SpO2 100%   BMI 29.26 kg/m   Plan: Discharge home.  Will give a single dose oxycodone 5 mg here.  Patient has a ride home.    Home treatment plan: Patient was counseled on back pain precautions and advised to do activity as tolerated but avoid strenuous activity and do not lift, push, or pull heavy objects more than 10 pounds for  the next week.  Patient counseled to use ice or heat on back as needed for pain and spasm.    Medications prescribed:  Robaxin for muscle pain and spasm. Patient counseled on proper use of muscle relaxant medication.  They were requested not to drink alcohol, drive any vehicle, or do any dangerous activities while taking this medication due to potential drowsiness and unintended.  Patient verbalized understanding.  Also given a small amount # 4 tablets of oxycodone to use for episodes of very severe distressing pain and naproxen to use daily.  Return instructions discussed with patient: Urged to return with worsening severe pain, loss of bowel or bladder control, trouble walking, development of weakness in the legs, or with any other concerns.  Follow-up instructions discussed with patient: Patient urged to follow-up with PCP if pain does not improve with treatment and rest or if pain becomes recurrent.                                 Medical Decision Making Risk Prescription drug management.   Patient with back pain. No neurological deficits. Patient is ambulatory. No warning symptoms of back pain including: fecal incontinence, urinary retention or overflow incontinence, night sweats, waking from sleep with back pain, unexplained fevers or weight loss, h/o cancer, IVDU, recent trauma. No concern for cauda equina, epidural abscess, or other serious cause of back pain. Conservative measures such as rest, ice/heat and pain medicine indicated with PCP follow-up if no improvement with conservative management.          Final Clinical Impression(s) / ED Diagnoses Final diagnoses:  Acute midline thoracic back pain    Rx / DC Orders ED Discharge Orders          Ordered    methocarbamol (ROBAXIN) 500 MG tablet  4 times daily  04/11/23 1044    oxyCODONE (OXY IR/ROXICODONE) 5 MG immediate release tablet  Every 6 hours PRN        04/11/23 1044    naproxen (NAPROSYN) 500 MG tablet  2  times daily        04/11/23 1044              Lyna Sandhoff, PA-C 04/11/23 1049    Russella Courts A, DO 04/16/23 [phone]

## 2023-04-11 NOTE — ED Triage Notes (Signed)
 Pt reports was making her bad when she felt  severe pain mid tu upper back . Hx back pain , uncomfortable and obvious distress with ambulation .

## 2023-04-13 ENCOUNTER — Ambulatory Visit (INDEPENDENT_AMBULATORY_CARE_PROVIDER_SITE_OTHER)

## 2023-04-13 ENCOUNTER — Ambulatory Visit: Admitting: Sports Medicine

## 2023-04-13 VITALS — BP 120/80 | HR 88 | Ht 62.0 in | Wt 166.0 lb

## 2023-04-13 DIAGNOSIS — G8929 Other chronic pain: Secondary | ICD-10-CM | POA: Diagnosis not present

## 2023-04-13 DIAGNOSIS — M546 Pain in thoracic spine: Secondary | ICD-10-CM

## 2023-04-13 NOTE — Progress Notes (Signed)
 Aleen Sells D.[facility] [address] [place] [address] Phone: [phone]   Assessment and Plan:     1. Chronic midline thoracic back pain -Chronic with exacerbation, initial visit - Mid to upper back pain ongoing for 3+ months with significant flare over the past 1 week caused by making a bed.  Suspect thoracic musculoskeletal dysfunction with thoracic paraspinal muscular spasms and strain, spasms leading to dysfunction of rib cage causing pain wrapping around ribs to chest - Start meloxicam 15 mg daily x2 weeks.  If still having pain after 2 weeks, complete 3rd-week of NSAID. May use remaining NSAID as needed once daily for pain control.  Do not to use additional over-the-counter NSAIDs (ibuprofen, naproxen, Advil, Aleve, etc.) while taking prescription NSAIDs.  May use Tylenol [phone] mg 2 to 3 times a day for breakthrough pain.  Recommend discontinuing naproxen - May continue Robaxin 500 mg as needed for muscle spasms - Recommend avoiding oxycodone if possible.  Only recommend using it for severe breakthrough pain - Start HEP for thoracic spine - X-ray obtained in clinic.  My interpretation: No acute fracture or   vertebral collapse  15 additional minutes spent for educating Therapeutic Home Exercise Program.  This included exercises focusing on stretching, strengthening, with focus on eccentric aspects.   Long term goals include an improvement in range of motion, strength, endurance as well as avoiding reinjury. Patient's frequency would include in 1-2 times a day, 3-5 times a week for a duration of 6-12 weeks. Proper technique shown and discussed handout in great detail with ATC.  All questions were discussed and answered.     Pertinent previous records reviewed include thoracic x-ray 2022, ER note 04/11/2023  Follow Up: 4 weeks for reevaluation.  Could further discuss OMT versus physical therapy versus prednisone course   Subjective:   I,  Rolland Bimler am a scribe for Dr. Jean Rosenthal.    Chief Complaint: thoracic back pain   HPI:   04/13/23 Patient is a 43 year old female with thoracic back pain. Patient states that it has been ongoing for a while. Recently got worse. Went to the [facility] on Tuesday and was given a muscle relaxer, naproxen and oxycodone. From mid-back to the base of her neck. Pain right now with the medication is a 3/10. Without the medication it was an 8.5/10 pain level. Pain will go across her chest, muscle tightness, and lower back pain.   Duration? Long time Did you have an Injury to cause this pain? no Taking Medication for pain? yes Numbness or Tingling? no Does the pain Radiate? yes Altered gait or use? yes ROM/ impairment of movement? yes   Relevant Historical Information: Prediabetes, hypertension  Additional pertinent review of systems negative.   Current Outpatient Medications:    benzonatate (TESSALON) 200 MG capsule, Take 1 capsule (200 mg total) by mouth 2 (two) times daily as needed for cough., Disp: 20 capsule, Rfl: 0   carbamazepine (TEGRETOL XR) 100 MG 12 hr tablet, Take 1 pill in AM and 2 pills in PM for one week, then increase to 2 pills twice a day, Disp: 120 tablet, Rfl: 6   carvedilol (COREG) 12.5 MG tablet, Take 12.5 mg by mouth 2 (two) times daily., Disp: , Rfl:    clonazePAM (KLONOPIN) 1 MG tablet, TAKE 1 TABLET BY MOUTH 2 TIMES DAILY AS NEEDED FOR ANXIETY., Disp: 60 tablet, Rfl: 2   escitalopram (LEXAPRO) 20 MG tablet, Take 1 tablet (20 mg  total) by mouth daily., Disp: 90 tablet, Rfl: 3   fluticasone (FLONASE) 50 MCG/ACT nasal spray, Place 2 sprays into both nostrils daily., Disp: 16 g, Rfl: 0   meloxicam (MOBIC) 15 MG tablet, Take 1 tablet (15 mg total) by mouth daily as needed for pain., Disp: 90 tablet, Rfl: 1   methocarbamol (ROBAXIN) 500 MG tablet, Take 2 tablets (1,000 mg total) by mouth 4 (four) times daily., Disp: 30 tablet, Rfl: 0   mometasone (ELOCON) 0.1 %  ointment, Apply topically daily., Disp: 45 g, Rfl: 0   naproxen (NAPROSYN) 500 MG tablet, Take 1 tablet (500 mg total) by mouth 2 (two) times daily., Disp: 20 tablet, Rfl: 0   oxyCODONE (OXY IR/ROXICODONE) 5 MG immediate release tablet, Take 1 tablet (5 mg total) by mouth every 6 (six) hours as needed for severe pain (pain score 7-10)., Disp: 4 tablet, Rfl: 0   pimecrolimus (ELIDEL) 1 % cream, Apply topically 2 (two) times daily., Disp: 60 g, Rfl: 4   Rimegepant Sulfate (NURTEC) 75 MG TBDP, Take 1 tablet (75 mg total) by mouth daily as needed., Disp: 15 tablet, Rfl: 1   rosuvastatin (CRESTOR) 10 MG tablet, TAKE 1 TABLET BY MOUTH EVERY DAY, Disp: 90 tablet, Rfl: 2   spironolactone (ALDACTONE) 50 MG tablet, Take 50 mg by mouth daily., Disp: , Rfl:    triamcinolone cream (KENALOG) 0.1 %, Apply 1 application topically 2 (two) times daily., Disp: 45 g, Rfl: 2   azithromycin (ZITHROMAX) 250 MG tablet, Sig as indicated, Disp: 6 tablet, Rfl: 0   Objective:     Vitals:   04/13/23 1539  BP: 120/80  Pulse: 88  SpO2: 98%  Weight: 166 lb (75.3 kg)  Height: 5\' 2"  (1.575 m)      Body mass index is 30.36 kg/m.    Physical Exam:    Gen: Appears well, nad, nontoxic and pleasant Psych: Alert and oriented, appropriate mood and affect Neuro: sensation intact, strength is 5/5 in upper and lower extremities, muscle tone wnl Skin: no susupicious lesions or rashes  Back - Normal skin, Spine with normal alignment and no deformity.     tenderness to T2-6 vertebral process palpation.   Bilateral thoracic paraspinous muscles are mildly tender and without spasm Mild discomfort with scapular retraction and protraction Gait normal    Electronically signed by:  Aleen Sells D.[facility] 4:17 PM 04/13/23

## 2023-04-13 NOTE — Patient Instructions (Signed)
-   Start meloxicam 15 mg daily x2 weeks.  If still having pain after 2 weeks, complete 3rd-week of NSAID. May use remaining NSAID as needed once daily for pain control.  Do not to use additional over-the-counter NSAIDs (ibuprofen, naproxen, Advil, Aleve, etc.) while taking prescription NSAIDs.  May use Tylenol [phone] mg 2 to 3 times a day for breakthrough pain. Stop naproxen  use muscle relaxer as needed for muscle spasms  Thoracic HEP  Use heat over painful areas 3-4 week follow up

## 2023-04-17 ENCOUNTER — Encounter: Payer: Self-pay | Admitting: Sports Medicine

## 2023-04-20 ENCOUNTER — Encounter: Payer: Self-pay | Admitting: Internal Medicine

## 2023-04-20 DIAGNOSIS — R300 Dysuria: Secondary | ICD-10-CM

## 2023-04-25 ENCOUNTER — Other Ambulatory Visit: Payer: Self-pay | Admitting: Internal Medicine

## 2023-04-25 ENCOUNTER — Other Ambulatory Visit (INDEPENDENT_AMBULATORY_CARE_PROVIDER_SITE_OTHER)

## 2023-04-25 ENCOUNTER — Encounter: Payer: Self-pay | Admitting: Internal Medicine

## 2023-04-25 DIAGNOSIS — R300 Dysuria: Secondary | ICD-10-CM | POA: Diagnosis not present

## 2023-04-25 LAB — URINALYSIS, ROUTINE W REFLEX MICROSCOPIC
Bilirubin Urine: NEGATIVE
Ketones, ur: NEGATIVE
Nitrite: POSITIVE — AB
Specific Gravity, Urine: 1.01 (ref 1.000–1.030)
Total Protein, Urine: NEGATIVE
Urine Glucose: NEGATIVE
Urobilinogen, UA: 0.2 (ref 0.0–1.0)
pH: 7 (ref 5.0–8.0)

## 2023-04-25 MED ORDER — CIPROFLOXACIN HCL 500 MG PO TABS: 500.0000 mg | ORAL_TABLET | Freq: Two times a day (BID) | ORAL | 0 refills | Status: AC

## 2023-04-27 LAB — URINE CULTURE

## 2023-05-04 ENCOUNTER — Telehealth: Payer: Self-pay

## 2023-05-04 ENCOUNTER — Encounter: Payer: Self-pay | Admitting: Internal Medicine

## 2023-05-04 ENCOUNTER — Ambulatory Visit: Admitting: Internal Medicine

## 2023-05-04 VITALS — BP 120/74 | HR 75 | Temp 99.1°F | Ht 62.0 in | Wt 167.0 lb

## 2023-05-04 DIAGNOSIS — E538 Deficiency of other specified B group vitamins: Secondary | ICD-10-CM | POA: Diagnosis not present

## 2023-05-04 DIAGNOSIS — R7303 Prediabetes: Secondary | ICD-10-CM | POA: Diagnosis not present

## 2023-05-04 DIAGNOSIS — I10 Essential (primary) hypertension: Secondary | ICD-10-CM | POA: Diagnosis not present

## 2023-05-04 DIAGNOSIS — Z1211 Encounter for screening for malignant neoplasm of colon: Secondary | ICD-10-CM

## 2023-05-04 DIAGNOSIS — E7800 Pure hypercholesterolemia, unspecified: Secondary | ICD-10-CM | POA: Diagnosis not present

## 2023-05-04 DIAGNOSIS — N3281 Overactive bladder: Secondary | ICD-10-CM

## 2023-05-04 DIAGNOSIS — E559 Vitamin D deficiency, unspecified: Secondary | ICD-10-CM | POA: Diagnosis not present

## 2023-05-04 DIAGNOSIS — K5909 Other constipation: Secondary | ICD-10-CM | POA: Diagnosis not present

## 2023-05-04 DIAGNOSIS — Z0001 Encounter for general adult medical examination with abnormal findings: Secondary | ICD-10-CM

## 2023-05-04 DIAGNOSIS — F32A Depression, unspecified: Secondary | ICD-10-CM

## 2023-05-04 DIAGNOSIS — L309 Dermatitis, unspecified: Secondary | ICD-10-CM

## 2023-05-04 DIAGNOSIS — Z0000 Encounter for general adult medical examination without abnormal findings: Secondary | ICD-10-CM

## 2023-05-04 LAB — HEPATIC FUNCTION PANEL
ALT: 16 U/L (ref 0–35)
AST: 19 U/L (ref 0–37)
Albumin: 4.4 g/dL (ref 3.5–5.2)
Alkaline Phosphatase: 45 U/L (ref 39–117)
Bilirubin, Direct: 0.1 mg/dL (ref 0.0–0.3)
Total Bilirubin: 0.4 mg/dL (ref 0.2–1.2)
Total Protein: 7.5 g/dL (ref 6.0–8.3)

## 2023-05-04 LAB — URINALYSIS, ROUTINE W REFLEX MICROSCOPIC
Bilirubin Urine: NEGATIVE
Ketones, ur: NEGATIVE
Leukocytes,Ua: NEGATIVE
Nitrite: NEGATIVE
Specific Gravity, Urine: 1.015 (ref 1.000–1.030)
Total Protein, Urine: NEGATIVE
Urine Glucose: NEGATIVE
Urobilinogen, UA: 0.2 (ref 0.0–1.0)
pH: 6 (ref 5.0–8.0)

## 2023-05-04 LAB — MICROALBUMIN / CREATININE URINE RATIO
Creatinine,U: 47.2 mg/dL
Microalb Creat Ratio: UNDETERMINED mg/g (ref 0.0–30.0)
Microalb, Ur: <0.7 mg/dL

## 2023-05-04 LAB — BASIC METABOLIC PANEL WITH GFR
BUN: 15 mg/dL (ref 6–23)
CO2: 26 meq/L (ref 19–32)
Calcium: 9.7 mg/dL (ref 8.4–10.5)
Chloride: 103 meq/L (ref 96–112)
Creatinine, Ser: 0.79 mg/dL (ref 0.40–1.20)
GFR: 90.52 mL/min (ref >60.00)
Glucose, Bld: 85 mg/dL (ref 70–99)
Potassium: 4 meq/L (ref 3.5–5.1)
Sodium: 135 meq/L (ref 135–145)

## 2023-05-04 LAB — LIPID PANEL
Cholesterol: 139 mg/dL (ref 0–200)
HDL: 42.9 mg/dL (ref >39.00)
LDL Cholesterol: 64 mg/dL (ref 0–99)
NonHDL: 96.1
Total CHOL/HDL Ratio: 3
Triglycerides: 162 mg/dL — ABNORMAL HIGH (ref 0.0–149.0)
VLDL: 32.4 mg/dL (ref 0.0–40.0)

## 2023-05-04 LAB — CBC WITH DIFFERENTIAL/PLATELET
Basophils Absolute: 0.1 10*3/uL (ref 0.0–0.1)
Basophils Relative: 0.9 % (ref 0.0–3.0)
Eosinophils Absolute: 0.1 10*3/uL (ref 0.0–0.7)
Eosinophils Relative: 1.7 % (ref 0.0–5.0)
HCT: 36.8 % (ref 36.0–46.0)
Hemoglobin: 12.2 g/dL (ref 12.0–15.0)
Lymphocytes Relative: 32.1 % (ref 12.0–46.0)
Lymphs Abs: 2.5 10*3/uL (ref 0.7–4.0)
MCHC: 33.3 g/dL (ref 30.0–36.0)
MCV: 95.6 fl (ref 78.0–100.0)
Monocytes Absolute: 0.7 10*3/uL (ref 0.1–1.0)
Monocytes Relative: 8.7 % (ref 3.0–12.0)
Neutro Abs: 4.3 10*3/uL (ref 1.4–7.7)
Neutrophils Relative %: 56.6 % (ref 43.0–77.0)
Platelets: 297 10*3/uL (ref 150.0–400.0)
RBC: 3.85 Mil/uL — ABNORMAL LOW (ref 3.87–5.11)
RDW: 13.6 % (ref 11.5–15.5)
WBC: 7.7 10*3/uL (ref 4.0–10.5)

## 2023-05-04 LAB — VITAMIN B12: Vitamin B-12: 291 pg/mL (ref 211–911)

## 2023-05-04 LAB — VITAMIN D 25 HYDROXY (VIT D DEFICIENCY, FRACTURES): VITD: 25.04 ng/mL — ABNORMAL LOW (ref 30.00–100.00)

## 2023-05-04 LAB — HEMOGLOBIN A1C: Hgb A1c MFr Bld: 5.9 % (ref 4.6–6.5)

## 2023-05-04 LAB — TSH: TSH: 0.98 u[IU]/mL (ref 0.35–5.50)

## 2023-05-04 MED ORDER — ZORYVE 0.3 % EX CREA: TOPICAL_CREAM | CUTANEOUS | 2 refills | Status: DC

## 2023-05-04 MED ORDER — TRULANCE 3 MG PO TABS
ORAL_TABLET | ORAL | 11 refills | Status: AC
Start: 2023-05-04 — End: ?

## 2023-05-04 MED ORDER — TRIAMCINOLONE ACETONIDE 0.1 % EX CREA: 1.0000 | TOPICAL_CREAM | Freq: Two times a day (BID) | CUTANEOUS | 2 refills | Status: AC

## 2023-05-04 MED ORDER — DULOXETINE HCL 60 MG PO CPEP: 60.0000 mg | ORAL_CAPSULE | Freq: Every day | ORAL | 11 refills | Status: DC

## 2023-05-04 MED ORDER — GEMTESA 75 MG PO TABS: ORAL_TABLET | ORAL | 11 refills | Status: DC

## 2023-05-04 MED ORDER — ZORYVE 0.3 % EX CREA
TOPICAL_CREAM | CUTANEOUS | 2 refills | Status: DC
Start: 2023-05-04 — End: 2023-05-04

## 2023-05-04 NOTE — Assessment & Plan Note (Signed)
 Ok for gemtessa 75 mg every day but consider vesicare if not covered by insurance

## 2023-05-04 NOTE — Telephone Encounter (Signed)
 Pharmacy Patient Advocate Encounter   Received notification from CoverMyMeds that prior authorization for Gemtesa  75MG  tablets is required/requested.   Insurance verification completed.   The patient is insured through [facility] .   Per test claim: PA required; PA submitted to above mentioned insurance via CoverMyMeds Key/confirmation #/EOC [id number] Status is pending

## 2023-05-04 NOTE — Assessment & Plan Note (Signed)
 Ok for zoryve  cr prn asd, but triam if not covered by insurance

## 2023-05-04 NOTE — Progress Notes (Signed)
 Patient ID: Kristie Nelson, female   DOB: 1980-08-24, 43 y.o.   MRN: [id number]         Chief Complaint:: wellness exam and Irritable Bowel Syndrome (And depression medication ) , eczema, OAB       HPI:  Kristie Nelson is a 43 y.o. female here for wellness exam; sees gyn yearly, for tdap and prevnar at pharmacy, for colonoscopy, o/w up to date                        Also has mild worsening anxiety depression on high dose lexapro  but hoping to change due to sexual dysfunction.  Denies worsening reflux, abd pain, dysphagia, n/v, or blood but has chronic constipation not controlled with numerous OTC med trials such as miralax, mag citrate, dulcolox , also linzess could not tolerate.  Also has worsening several wks excema with itchy rashes.  Also has urinary frequency for several months but Denies urinary symptoms such as dysuria, urgency, flank pain, hematuria or n/v, fever, chills. Pt denies chest pain, increased sob or doe, wheezing, orthopnea, PND, increased LE swelling, palpitations, dizziness or syncope.   Pt denies polydipsia, polyuria, or new focal neuro s/s.    Wt Readings from Last 3 Encounters:  05/04/23 167 lb (75.8 kg)  04/13/23 166 lb (75.3 kg)  04/11/23 160 lb (72.6 kg)   BP Readings from Last 3 Encounters:  05/04/23 120/74  04/13/23 120/80  04/11/23 (!) 147/87   Immunization History  Administered Date(s) Administered   Influenza Whole 11/16/2007, 11/19/2008   Influenza,inj,Quad PF,6+ Mos 11/14/2012, 10/27/2015   PFIZER Comirnaty(Gray Top)Covid-19 Tri-Sucrose Vaccine 03/16/2019, 04/09/2019, 11/02/2019   Td 01/04/2004   Tdap 08/30/2012   Health Maintenance Due  Topic Date Due   Pneumococcal Vaccine 50-19 Years old (1 of 2 - PCV) Never done   Cervical Cancer Screening (HPV/Pap Cotest)  04/04/2014   DTaP/Tdap/Td (3 - Td or Tdap) 08/31/2022   Colonoscopy  Never done      Past Medical History:  Diagnosis Date   ALLERGIC RHINITIS 04/03/2007   Anal fissure    ANXIETY  04/03/2007   DEPRESSION 04/03/2007   Dysrhythmia    palpitations due to stress   ECZEMA 03/19/2009   Heartburn in pregnancy    Hemorrhoids    HYPERLIPIDEMIA 12/09/2009   HYPERTENSION 04/03/2007   MIGRAINE, COMMON 12/09/2009   Postoperative anemia due to acute blood loss 08/30/2012   Shortness of breath    with anxiety   Past Surgical History:  Procedure Laterality Date   CESAREAN SECTION  2010   CESAREAN SECTION N/A 08/29/2012   Procedure: Repeat CESAREAN SECTION;  Surgeon: Camillo Celestine, MD;  Location: [facility];  Service: Obstetrics;  Laterality: N/A;  EDD: 09/17/12;REQUEST DEE;[name]    reports that she has never smoked. She has never used smokeless tobacco. She reports that she does not drink alcohol and does not use drugs. family history includes Diabetes in her brother, father, maternal grandmother, and mother; Heart disease in her mother; Hyperlipidemia in her maternal grandmother and mother; Hypertension in her brother, father, maternal grandfather, maternal grandmother, and mother; Liver cancer in her maternal grandfather; Thyroid  disease in her maternal grandmother. Allergies  Allergen Reactions   Dog Epithelium Itching   Latex Itching    swelling   Peanut-Containing Drug Products     REACTION: throat swelling   Penicillins     REACTION: itching   Tomato (Diagnostic) Itching and Swelling   Current Outpatient Medications  on File Prior to Visit  Medication Sig Dispense Refill   benzonatate  (TESSALON ) 200 MG capsule Take 1 capsule (200 mg total) by mouth 2 (two) times daily as needed for cough. 20 capsule 0   carbamazepine  (TEGRETOL  XR) 100 MG 12 hr tablet Take 1 pill in AM and 2 pills in PM for one week, then increase to 2 pills twice a day 120 tablet 6   carvedilol (COREG) 12.5 MG tablet Take 12.5 mg by mouth 2 (two) times daily.     ciprofloxacin  (CIPRO ) 500 MG tablet Take 1 tablet (500 mg total) by mouth 2 (two) times daily for 10 days. 20 tablet 0   clonazePAM  (KLONOPIN ) 1  MG tablet TAKE 1 TABLET BY MOUTH 2 TIMES DAILY AS NEEDED FOR ANXIETY. 60 tablet 2   fluticasone  (FLONASE ) 50 MCG/ACT nasal spray Place 2 sprays into both nostrils daily. 16 g 0   meloxicam  (MOBIC ) 15 MG tablet Take 1 tablet (15 mg total) by mouth daily as needed for pain. 90 tablet 1   methocarbamol  (ROBAXIN ) 500 MG tablet Take 2 tablets (1,000 mg total) by mouth 4 (four) times daily. 30 tablet 0   mometasone  (ELOCON ) 0.1 % ointment Apply topically daily. 45 g 0   naproxen  (NAPROSYN ) 500 MG tablet Take 1 tablet (500 mg total) by mouth 2 (two) times daily. 20 tablet 0   oxyCODONE  (OXY IR/ROXICODONE ) 5 MG immediate release tablet Take 1 tablet (5 mg total) by mouth every 6 (six) hours as needed for severe pain (pain score 7-10). 4 tablet 0   pimecrolimus  (ELIDEL ) 1 % cream Apply topically 2 (two) times daily. 60 g 4   Rimegepant Sulfate (NURTEC) 75 MG TBDP Take 1 tablet (75 mg total) by mouth daily as needed. 15 tablet 1   rosuvastatin  (CRESTOR ) 10 MG tablet TAKE 1 TABLET BY MOUTH EVERY DAY 90 tablet 2   spironolactone (ALDACTONE) 50 MG tablet Take 50 mg by mouth daily.     No current facility-administered medications on file prior to visit.        ROS:  All others reviewed and negative.  Objective        PE:  BP 120/74 (BP Location: Right Arm, Patient Position: Sitting, Cuff Size: Normal)   Pulse 75   Temp 99.1 F (37.3 C) (Oral)   Ht 5\' 2"  (1.575 m)   Wt 167 lb (75.8 kg)   LMP 04/06/2023 (Exact Date)   SpO2 99%   BMI 30.54 kg/m                 Constitutional: Pt appears in NAD               HENT: Head: NCAT.                Right Ear: External ear normal.                 Left Ear: External ear normal.                Eyes: . Pupils are equal, round, and reactive to light. Conjunctivae and EOM are normal               Nose: without d/c or deformity               Neck: Neck supple. Gross normal ROM               Cardiovascular: Normal rate and regular rhythm.  Pulmonary/Chest: Effort normal and breath sounds without rales or wheezing.                Abd:  Soft, NT, ND, + BS, no organomegaly               Neurological: Pt is alert. At baseline orientation, motor grossly intact               Skin: Skin is warm, LE edema - none, several eczema patches to extremities               Psychiatric: Pt behavior is normal without agitation , depressed affect  Micro: none  Cardiac tracings I have personally interpreted today:  none  Pertinent Radiological findings (summarize): none   Lab Results  Component Value Date   WBC 7.7 05/04/2023   HGB 12.2 05/04/2023   HCT 36.8 05/04/2023   PLT 297.0 05/04/2023   GLUCOSE 85 05/04/2023   CHOL 139 05/04/2023   TRIG 162.0 (H) 05/04/2023   HDL 42.90 05/04/2023   LDLDIRECT 127.0 07/11/2017   LDLCALC 64 05/04/2023   ALT 16 05/04/2023   AST 19 05/04/2023   NA 135 05/04/2023   K 4.0 05/04/2023   CL 103 05/04/2023   CREATININE 0.79 05/04/2023   BUN 15 05/04/2023   CO2 26 05/04/2023   TSH 0.98 05/04/2023   HGBA1C 5.9 05/04/2023   MICROALBUR <0.7 05/04/2023   Assessment/Plan:  ALISSA KNECHT is a 43 y.o. Black or African American [2] female with  has a past medical history of ALLERGIC RHINITIS (04/03/2007), Anal fissure, ANXIETY (04/03/2007), DEPRESSION (04/03/2007), Dysrhythmia, ECZEMA (03/19/2009), Heartburn in pregnancy, Hemorrhoids, HYPERLIPIDEMIA (12/09/2009), HYPERTENSION (04/03/2007), MIGRAINE, COMMON (12/09/2009), Postoperative anemia due to acute blood loss (08/30/2012), and Shortness of breath.  Encounter for well adult exam with abnormal findings Age and sex appropriate education and counseling updated with regular exercise and diet Referrals for preventative services - for colonoscopy Immunizations addressed - for tdap and prevnar 20 at pharmacy Smoking counseling  - none needed Evidence for depression or other mood disorder - none significant Most recent labs reviewed. I have personally reviewed and  have noted: 1) the patient's medical and social history 2) The patient's current medications and supplements 3) The patient's height, weight, and BMI have been recorded in the chart   Hyperlipidemia Lab Results  Component Value Date   LDLCALC 64 05/04/2023   Stable, pt to continue current statin crestor  10 mg qd   Depression Has sexual dysfunction with lexapro  - ok to half tab x 2 wks, then stop , then start cymbalta  60 mg qd  Chronic constipation Pt is s/p multiple unsuccessful otc trials, intolerant of linzess, ok for Trulance  3 mg qd  Prediabetes Lab Results  Component Value Date   HGBA1C 5.9 05/04/2023   Stable, pt to continue current medical treatment  - diet, wt control   Eczema Ok for zoryve  cr prn asd, but triam if not covered by insurance  Overactive bladder Ok for gemtessa 75 mg every day but consider vesicare if not covered by insurance  Followup: Return in about 1 year (around 05/03/2024).  Rosalia Colonel, MD 05/04/2023 8:07 PM [facility]

## 2023-05-04 NOTE — Patient Instructions (Addendum)
 Please consider the Tdap tetanus at the pharmacy  Please take all new medication as prescribed - the Zoryve  for eczema if ok with insurance, Trulance  for constipation and Cymbalta  60 mg per day, and Gemtessa 75 mg per day for bladder  Ok to wean off the lexapro  by taking half for 2 wks, then stop  Please continue all other medications as before, and refills have been done if requested.  Please have the pharmacy call with any other refills you may need.  Please continue your efforts at being more active, low cholesterol diet, and weight control.  You are otherwise up to date with prevention measures today.  Please keep your appointments with your specialists as you may have planned  Please go to the LAB at the blood drawing area for the tests to be done  You will be contacted by phone if any changes need to be made immediately.  Otherwise, you will receive a letter about your results with an explanation, but please check with MyChart first.  Please make an Appointment to return for your 1 year visit, or sooner if needed

## 2023-05-04 NOTE — Assessment & Plan Note (Signed)
 Pt is s/p multiple unsuccessful otc trials, intolerant of linzess, ok for Trulance  3 mg qd

## 2023-05-04 NOTE — Assessment & Plan Note (Signed)
 Lab Results  Component Value Date   HGBA1C 5.9 05/04/2023   Stable, pt to continue current medical treatment  - diet, wt control

## 2023-05-04 NOTE — Progress Notes (Signed)
 The test results show that your current treatment is OK, as the tests are stable.  Please continue the same plan.  There is no other need for change of treatment or further evaluation based on these results, at this time.  thanks

## 2023-05-04 NOTE — Telephone Encounter (Signed)
 Pharmacy Patient Advocate Encounter   Received notification from CoverMyMeds that prior authorization for Trulance  3MG  tablets  is required/requested.   Insurance verification completed.   The patient is insured through [employer] [facility] .   Per test claim: PA required; PA started via CoverMyMeds. KEY [id number] . Waiting for clinical questions to populate.

## 2023-05-04 NOTE — Assessment & Plan Note (Signed)
 Lab Results  Component Value Date   LDLCALC 64 05/04/2023   Stable, pt to continue current statin crestor  10 mg qd

## 2023-05-04 NOTE — Assessment & Plan Note (Signed)
 Age and sex appropriate education and counseling updated with regular exercise and diet Referrals for preventative services - for colonoscopy Immunizations addressed - for tdap and prevnar 20 at pharmacy Smoking counseling  - none needed Evidence for depression or other mood disorder - none significant Most recent labs reviewed. I have personally reviewed and have noted: 1) the patient's medical and social history 2) The patient's current medications and supplements 3) The patient's height, weight, and BMI have been recorded in the chart

## 2023-05-04 NOTE — Assessment & Plan Note (Signed)
 Has sexual dysfunction with lexapro  - ok to half tab x 2 wks, then stop , then start cymbalta  60 mg qd

## 2023-05-05 ENCOUNTER — Other Ambulatory Visit (HOSPITAL_COMMUNITY): Payer: Self-pay

## 2023-05-05 NOTE — Telephone Encounter (Signed)
 Pharmacy Patient Advocate Encounter  Received notification from [employer] [facility] that Prior Authorization for Gemtesa  75MG  tablets has been DENIED.  Full denial letter will be uploaded to the media tab. See denial reason below.   PA #/Case ID/Reference #: [id number]

## 2023-05-05 NOTE — Telephone Encounter (Signed)
 Pharmacy Patient Advocate Encounter  Received notification from [employer] [facility] that Prior Authorization for Trulance  3MG  tablets has been APPROVED from 05/05/2023 to 05/05/2026   PA #/Case ID/Reference #: [id number]

## 2023-05-05 NOTE — Telephone Encounter (Signed)
 Clinical questions have been answered and PA submitted.TO PLAN. PA currently Pending.

## 2023-05-08 ENCOUNTER — Other Ambulatory Visit (HOSPITAL_COMMUNITY): Payer: Self-pay

## 2023-05-08 ENCOUNTER — Telehealth: Payer: Self-pay

## 2023-05-08 ENCOUNTER — Encounter: Admitting: Internal Medicine

## 2023-05-08 MED ORDER — SOLIFENACIN SUCCINATE 5 MG PO TABS: 5.0000 mg | ORAL_TABLET | Freq: Every day | ORAL | 3 refills | Status: AC

## 2023-05-08 NOTE — Telephone Encounter (Signed)
See below , thanks

## 2023-05-08 NOTE — Addendum Note (Signed)
 Addended by: Roslyn Coombe on: 05/08/2023 12:56 PM   Modules accepted: Orders

## 2023-05-08 NOTE — Telephone Encounter (Signed)
 Ok to change this to vesicare 5 mg per day  I will send, and please to let pt know

## 2023-05-08 NOTE — Telephone Encounter (Signed)
 Pharmacy Patient Advocate Encounter   Received notification from Onbase that prior authorization for Zoryve  0.3% cream is required/requested.   Insurance verification completed.   The patient is insured through [employer] [facility] .   Per test claim: PA required and submitted KEY/EOC/Request #: [id number] DENIED.  Full denial letter will be uploaded to the media tab. See denial reason below.

## 2023-05-10 NOTE — Progress Notes (Unsigned)
 [name] D.[facility] [address] [place] [address] Phone: [phone]   Assessment and Plan:     There are no diagnoses linked to this encounter.  ***   Pertinent previous records reviewed include ***    Follow Up: ***     Subjective:   I, Breylan Lefevers, am serving as a Neurosurgeon for Doctor Ulysees Gander  Chief Complaint: thoracic back pain    HPI:    04/13/23 Patient is a 43 year old female with thoracic back pain. Patient states that it has been ongoing for a while. Recently got worse. Went to the [facility] on Tuesday and was given a muscle relaxer, naproxen  and oxycodone . From mid-back to the base of her neck. Pain right now with the medication is a 3/10. Without the medication it was an 8.5/10 pain level. Pain will go across her chest, muscle tightness, and lower back pain.    Duration? Long time Did you have an Injury to cause this pain? no Taking Medication for pain? yes Numbness or Tingling? no Does the pain Radiate? yes Altered gait or use? yes ROM/ impairment of movement? yes   05/11/2023 Patient states   Relevant Historical Information: Prediabetes, hypertension  Additional pertinent review of systems negative.   Current Outpatient Medications:    solifenacin (VESICARE) 5 MG tablet, Take 1 tablet (5 mg total) by mouth daily., Disp: 90 tablet, Rfl: 3   benzonatate  (TESSALON ) 200 MG capsule, Take 1 capsule (200 mg total) by mouth 2 (two) times daily as needed for cough., Disp: 20 capsule, Rfl: 0   carbamazepine  (TEGRETOL  XR) 100 MG 12 hr tablet, Take 1 pill in AM and 2 pills in PM for one week, then increase to 2 pills twice a day, Disp: 120 tablet, Rfl: 6   carvedilol (COREG) 12.5 MG tablet, Take 12.5 mg by mouth 2 (two) times daily., Disp: , Rfl:    clonazePAM  (KLONOPIN ) 1 MG tablet, TAKE 1 TABLET BY MOUTH 2 TIMES DAILY AS NEEDED FOR ANXIETY., Disp: 60 tablet, Rfl: 2   DULoxetine  (CYMBALTA ) 60 MG capsule,  Take 1 capsule (60 mg total) by mouth daily., Disp: 30 capsule, Rfl: 11   fluticasone  (FLONASE ) 50 MCG/ACT nasal spray, Place 2 sprays into both nostrils daily., Disp: 16 g, Rfl: 0   meloxicam  (MOBIC ) 15 MG tablet, Take 1 tablet (15 mg total) by mouth daily as needed for pain., Disp: 90 tablet, Rfl: 1   methocarbamol  (ROBAXIN ) 500 MG tablet, Take 2 tablets (1,000 mg total) by mouth 4 (four) times daily., Disp: 30 tablet, Rfl: 0   mometasone  (ELOCON ) 0.1 % ointment, Apply topically daily., Disp: 45 g, Rfl: 0   naproxen  (NAPROSYN ) 500 MG tablet, Take 1 tablet (500 mg total) by mouth 2 (two) times daily., Disp: 20 tablet, Rfl: 0   oxyCODONE  (OXY IR/ROXICODONE ) 5 MG immediate release tablet, Take 1 tablet (5 mg total) by mouth every 6 (six) hours as needed for severe pain (pain score 7-10)., Disp: 4 tablet, Rfl: 0   pimecrolimus  (ELIDEL ) 1 % cream, Apply topically 2 (two) times daily., Disp: 60 g, Rfl: 4   Plecanatide  (TRULANCE ) 3 MG TABS, 1 tab by mouth once daily, Disp: 30 tablet, Rfl: 11   Rimegepant Sulfate (NURTEC) 75 MG TBDP, Take 1 tablet (75 mg total) by mouth daily as needed., Disp: 15 tablet, Rfl: 1   Roflumilast  (ZORYVE ) 0.3 % CREA, Use as directed to affected area once daily, Disp: 60 g, Rfl: 2  rosuvastatin  (CRESTOR ) 10 MG tablet, TAKE 1 TABLET BY MOUTH EVERY DAY, Disp: 90 tablet, Rfl: 2   spironolactone (ALDACTONE) 50 MG tablet, Take 50 mg by mouth daily., Disp: , Rfl:    triamcinolone  cream (KENALOG ) 0.1 %, Apply 1 Application topically 2 (two) times daily., Disp: 45 g, Rfl: 2   Objective:     There were no vitals filed for this visit.    There is no height or weight on file to calculate BMI.    Physical Exam:    ***   Electronically signed by:  Marshall Skeeter D.[facility] 7:50 AM 05/10/23

## 2023-05-11 ENCOUNTER — Ambulatory Visit: Admitting: Sports Medicine

## 2023-05-11 VITALS — BP 122/60 | HR 81 | Ht 62.0 in | Wt 165.2 lb

## 2023-05-11 DIAGNOSIS — M546 Pain in thoracic spine: Secondary | ICD-10-CM

## 2023-05-11 DIAGNOSIS — M9902 Segmental and somatic dysfunction of thoracic region: Secondary | ICD-10-CM

## 2023-05-11 DIAGNOSIS — M9908 Segmental and somatic dysfunction of rib cage: Secondary | ICD-10-CM

## 2023-05-11 DIAGNOSIS — M9901 Segmental and somatic dysfunction of cervical region: Secondary | ICD-10-CM

## 2023-05-11 DIAGNOSIS — G8929 Other chronic pain: Secondary | ICD-10-CM

## 2023-05-11 NOTE — Patient Instructions (Signed)
 Referral to Physical therapy. Continue home exercise program. Use meloxicam  as needed. Follow up in 4 to 6 weeks.

## 2023-05-22 ENCOUNTER — Encounter: Payer: Self-pay | Admitting: Internal Medicine

## 2023-05-25 ENCOUNTER — Other Ambulatory Visit: Payer: Self-pay

## 2023-05-25 ENCOUNTER — Ambulatory Visit: Attending: Sports Medicine

## 2023-05-25 DIAGNOSIS — M9901 Segmental and somatic dysfunction of cervical region: Secondary | ICD-10-CM | POA: Diagnosis not present

## 2023-05-25 DIAGNOSIS — M546 Pain in thoracic spine: Secondary | ICD-10-CM | POA: Diagnosis present

## 2023-05-25 DIAGNOSIS — M9908 Segmental and somatic dysfunction of rib cage: Secondary | ICD-10-CM | POA: Insufficient documentation

## 2023-05-25 DIAGNOSIS — G8929 Other chronic pain: Secondary | ICD-10-CM | POA: Diagnosis not present

## 2023-05-25 DIAGNOSIS — M9902 Segmental and somatic dysfunction of thoracic region: Secondary | ICD-10-CM | POA: Insufficient documentation

## 2023-05-25 DIAGNOSIS — M5459 Other low back pain: Secondary | ICD-10-CM | POA: Diagnosis present

## 2023-05-25 NOTE — Therapy (Signed)
 OUTPATIENT PHYSICAL THERAPY EVALUATION   Patient Name: Kristie Nelson MRN: [id number] DOB:02-15-1980, 43 y.o., female Today's Date: 05/25/2023  END OF SESSION:  Visit Number 1 Number of Visits 9 Date for PT re-eval 07/20/2023 Authorization Type:  [facility]   PT start time 1753  PT stop time 1827 PT time calculation (min) 34 min   Past Medical History:  Diagnosis Date   ALLERGIC RHINITIS 04/03/2007   Anal fissure    ANXIETY 04/03/2007   DEPRESSION 04/03/2007   Dysrhythmia    palpitations due to stress   ECZEMA 03/19/2009   Heartburn in pregnancy    Hemorrhoids    HYPERLIPIDEMIA 12/09/2009   HYPERTENSION 04/03/2007   MIGRAINE, COMMON 12/09/2009   Postoperative anemia due to acute blood loss 08/30/2012   Shortness of breath    with anxiety   Past Surgical History:  Procedure Laterality Date   CESAREAN SECTION  2010   CESAREAN SECTION N/A 08/29/2012   Procedure: Repeat CESAREAN SECTION;  Surgeon: Camillo Celestine, MD;  Location: [facility];  Service: Obstetrics;  Laterality: N/A;  EDD: 09/17/12;REQUEST DEE;[name]   Patient Active Problem List   Diagnosis Date Noted   Overactive bladder 05/04/2023   Lower respiratory infection 03/06/2023   Flu-like symptoms 03/06/2023   Intractable migraine with status migrainosus 04/06/2022   Allergic rhinoconjunctivitis 10/11/2021   Mild mitral regurgitation by prior echocardiogram 07/31/2021   Prediabetes 11/15/2020   Acute cough 11/02/2020   Aortic atherosclerosis (HCC) 09/27/2020   Abnormal thyroid  exam 09/24/2020   Cephalalgia 09/19/2020   Left ear hearing loss 09/19/2020   Chest pain 08/08/2020   Acute upper back pain 08/06/2020   Sinusitis 04/18/2020   Viral illness 10/08/2019   Abdominal wall pain in both upper quadrants 12/12/2018   Patellofemoral arthralgia of both knees 11/15/2018   Left knee pain 09/28/2018   Palpitations 07/10/2017   Upper respiratory tract infection 02/20/2017   Thrombosed external hemorrhoid  12/29/2016   Menstrual periods irregular 10/06/2016   Hypokalemia 10/06/2016   Hemorrhoids 10/19/2015   Low back pain 09/14/2015   Allergic conjunctivitis 01/21/2015   Low back strain 05/14/2014   Chronic constipation 03/26/2013   Abdominal pain 03/26/2013   Amenorrhea 02/18/2011   Encounter for well adult exam with abnormal findings 09/27/2010   Hyperlipidemia 12/09/2009   MIGRAINE, COMMON 12/09/2009   GANGLION CYST, WRIST, LEFT 12/09/2009   Eczema 03/19/2009   PARESTHESIA 09/23/2008   Anxiety state 04/03/2007   Depression 04/03/2007   Essential hypertension 04/03/2007   Allergic rhinitis 04/03/2007    PCP: Roslyn Coombe, MD  REFERRING PROVIDER: Ulysees Gander, DO  REFERRING DIAG:  [phone] (ICD-10-CM) - Chronic midline thoracic back pain  M99.02 (ICD-10-CM) - Somatic dysfunction of thoracic region  M99.01 (ICD-10-CM) - Somatic dysfunction of cervical region  M99.08 (ICD-10-CM) - Somatic dysfunction of rib region    THERAPY DIAG:  Other low back pain  Pain in thoracic spine  Rationale for Evaluation and Treatment: Rehabilitation  ONSET DATE: 05/11/2023 date of referral   SUBJECTIVE:  SUBJECTIVE STATEMENT: Patient reports that she has some upper back pain that has been present for a few months without known injury. She also reports that she has been experiencing lower back pain since she was 43 y.o. She had some relief following visit with referring provider. She also reports high stress level. She states that she is managing stress by "smoking, drinking, and sleeping."   Hand dominance: Right  PERTINENT HISTORY:  Relevant PMHx includes anxiety, depression, dysrhythmia, hypertension, migraine, left-sided hearing loss  PAIN:  Are you having pain? Yes: NPRS scale:  0-5/10 Pain location: BIL  Pain description: aching (low back), upper back (sharp)  Aggravating factors: Movement Relieving factors: meloxicam  as needed   PRECAUTIONS: None  RED FLAGS: None     WEIGHT BEARING RESTRICTIONS: No  FALLS:  Has patient fallen in last 6 months? No  LIVING ENVIRONMENT: Lives with: lives with an adult companion Lives in: House/apartment Stairs: No Has following equipment at home: None  OCCUPATION: Runner, broadcasting/film/video for special education classroom  PLOF: Independent  PATIENT GOALS: To have less pain   NEXT MD VISIT: 06/08/2023  OBJECTIVE:  Note: Objective measures were completed at Evaluation unless otherwise noted.  DIAGNOSTIC FINDINGS:  FINDINGS: There is no evidence of thoracic spine fracture. Mild scoliosis. No other significant bone abnormalities are identified.   IMPRESSION: No acute fracture or dislocation. Mild scoliosis.  PATIENT SURVEYS:  Modified Oswestry to be assessed     POSTURE: rounded shoulders, with otherwise rigid posture with unsupported sitting, and walking   PALPATION: Patient has mild tenderness to palpation throughout thoracolumbar assessment; she has increased muscle tension bilaterally and hypomobility with CPA assessment from T1-L4   UPPER EXTREMITY ROM: grossly WFL bilaterally   UPPER EXTREMITY MMT: grossly 4+/5 MMT bilaterally with UE elevation, and periscapular mm   LUMBAR ROM:   Active  AROM  eval  Flexion 90%  Extension 75%  Right lateral flexion 75% *  Left lateral flexion 75% *  Right rotation   Left rotation   Thoracic Flexion 50%  Thoracic Extension  25%   (Blank rows = not tested)  *presence of compensatory strategies   FUNCTIONAL TESTS:  5 times sit to stand: to be assessed at f/u visit 2 minute walk test: to be assessed at f/u visit    TREATMENT DATE:   [facility] Adult PT Treatment:                                                DATE: 05/25/2023   Initial evaluation: see patient education and  home exercise program as noted below                                                                                                                                   PATIENT EDUCATION:  Education  details: reviewed initial home exercise program; discussion of POC, prognosis and goals for skilled PT   Person educated: Patient Education method: Explanation Education comprehension: verbalized understanding  HOME EXERCISE PROGRAM: Access Code: [email or web address] URL: [email or web address] Date: 05/26/2023 Prepared by: Arlester Bence  Exercises - Sidelying Thoracic Rotation with Open Book  - 1 x daily - 7 x weekly - 2 sets - 10 reps - 3 sec hold - Cat Cow  - 1 x daily - 7 x weekly - 2 sets - 10 reps - 3 sec hold - Supine Lower Trunk Rotation  - 1 x daily - 7 x weekly - 2 sets - 10 reps - 3 sec  hold - Seated Lumbar Flexion Stretch  - 1 x daily - 7 x weekly - 2 sets - 10 reps - 3 sec hold  ASSESSMENT:  CLINICAL IMPRESSION: Kristie Nelson is a 43 y.o. female who was seen today for physical therapy evaluation and treatment for persistent thoracic and lumbar pain, with related mobility deficits. She has decreased spinal mobility and decreased thoracolumbar AROM, with presence of increased paraspinal mm tension. She has related pain and difficulty with bending, lifting, twisting, and performance of normal daily/occupational duties. It is also affecting her sleep quality. She requires skilled PT services at this time to address relevant deficits and improve overall function.     OBJECTIVE IMPAIRMENTS: decreased activity tolerance, decreased ROM, postural dysfunction, and pain.   ACTIVITY LIMITATIONS: carrying, lifting, bending, standing, squatting, sleeping, stairs, and bed mobility  PARTICIPATION LIMITATIONS: meal prep, cleaning, laundry, interpersonal relationship, shopping, community activity, and occupation  PERSONAL FACTORS: Age, Behavior pattern, Past/current experiences, Profession, Time  since onset of injury/illness/exacerbation, and 3+ comorbidities: Relevant PMHx includes anxiety, depression, dysrhythmia, hypertension, migraine, left-sided hearing loss are also affecting patient's functional outcome.   REHAB POTENTIAL: Fair    CLINICAL DECISION MAKING: Stable/uncomplicated  EVALUATION COMPLEXITY: Low   GOALS: Goals reviewed with patient? YES  SHORT TERM GOALS: Target date: 06/22/2023   Patient will be independent with initial home program at least 3 days/week.  Baseline: provided at eval Goal Status: INITIAL   2.  Patient will demonstrate improved postural awareness for at least 15 minutes while seated without need for cueing from PT.  Baseline: see objective measures Goal Status: INITIAL     LONG TERM GOALS: Target date: 07/20/2023  Patient will report improved overall functional ability with ODI score of 15/50 or less.  Baseline: to be assessed at first f/u visit Goal Status: INITIAL   2.  Patient will demonstrate ability to perform overhead lifting of at least 10# using appropriate body mechanics and with no more than minimal pain in order to safely perform normal daily/occupational tasks.   Baseline: unable  Goal Status: INITIAL  3.  Patient will be able to verbalize at least 3 techniques or exercises to facilitate nervous system regulation and muscle relaxation. Baseline: education and HEP initiated at initial evaluation  Goal status: INITIAL  4.  Patient will demonstrate at least 75% of full Thoracolumbar AROM in all directions  Baseline: limited by pain and muscle guarding  Goal status: INITIAL     PLAN:  PT FREQUENCY: 1-2x/week  PT DURATION: 8 weeks  PLANNED INTERVENTIONS: 97164- PT Re-evaluation, 97750- Physical Performance Testing, 97110-Therapeutic exercises, 97530- Therapeutic activity, [id number]- Neuromuscular re-education, 97535- Self Care, 16109- Manual therapy, [id number]- Electrical stimulation (unattended), 97012- Traction (mechanical),  Patient/Family education, Taping, Dry Needling, Joint mobilization, Joint manipulation, Spinal manipulation, Spinal mobilization, Cryotherapy, and Moist heat  PLAN FOR NEXT SESSION:  complete ODI at first follow up; perform STS and 2 MWT, continue thoracolumbar mobility, aerobic activity for pain modulation, patient education regarding muscle relaxation and neuromuscular regulation techniques, modalities and manual therapy as indicated   Arlester Bence, PT, DPT  05/28/2023 1:19 PM

## 2023-06-01 ENCOUNTER — Ambulatory Visit

## 2023-06-01 ENCOUNTER — Telehealth: Payer: Self-pay

## 2023-06-01 NOTE — Telephone Encounter (Signed)
 LVM regarding missed appt. Reminder of attendance policy and next schedule PT session. - MJ

## 2023-06-05 ENCOUNTER — Encounter: Payer: Self-pay | Admitting: Internal Medicine

## 2023-06-05 DIAGNOSIS — K5909 Other constipation: Secondary | ICD-10-CM

## 2023-06-07 ENCOUNTER — Ambulatory Visit (AMBULATORY_SURGERY_CENTER)

## 2023-06-07 ENCOUNTER — Other Ambulatory Visit: Payer: Self-pay

## 2023-06-07 ENCOUNTER — Encounter: Payer: Self-pay | Admitting: Internal Medicine

## 2023-06-07 VITALS — Ht 62.0 in | Wt 160.0 lb

## 2023-06-07 DIAGNOSIS — Z1211 Encounter for screening for malignant neoplasm of colon: Secondary | ICD-10-CM

## 2023-06-07 MED ORDER — NA SULFATE-K SULFATE-MG SULF 17.5-3.13-1.6 GM/177ML PO SOLN: 1.0000 | Freq: Once | ORAL | 0 refills | Status: AC

## 2023-06-07 NOTE — Progress Notes (Deleted)
 Kristie Nelson D.[facility] [address] [place] [address] Phone: [phone]   Assessment and Plan:     There are no diagnoses linked to this encounter.  ***   Pertinent previous records reviewed include ***    Follow Up: ***     Subjective:   I, Kristie Nelson, am serving as a Neurosurgeon for Doctor Kristie Nelson  Chief Complaint: thoracic back pain    HPI:    04/13/23 Patient is a 43 year old female with thoracic back pain. Patient states that it has been ongoing for a while. Recently got worse. Went to the [facility] on Tuesday and was given a muscle relaxer, naproxen  and oxycodone . From mid-back to the base of her neck. Pain right now with the medication is a 3/10. Without the medication it was an 8.5/10 pain level. Pain will go across her chest, muscle tightness, and lower back pain.    Duration? Long time Did you have an Injury to cause this pain? no Taking Medication for pain? yes Numbness or Tingling? no Does the pain Radiate? yes Altered gait or use? yes ROM/ impairment of movement? yes   05/11/2023 Patient states back hurts today. It was calming down since she was taking the medication but now the pain is different. She can tell when the muscle relaxer is wearing off now. Pain is like a piercing pain now.   06/08/2023 Patient states   Relevant Historical Information: Prediabetes, hypertension Additional pertinent review of systems negative.   Current Outpatient Medications:    benzonatate  (TESSALON ) 200 MG capsule, Take 1 capsule (200 mg total) by mouth 2 (two) times daily as needed for cough., Disp: 20 capsule, Rfl: 0   carbamazepine  (TEGRETOL  XR) 100 MG 12 hr tablet, Take 1 pill in AM and 2 pills in PM for one week, then increase to 2 pills twice a day, Disp: 120 tablet, Rfl: 6   carvedilol (COREG) 12.5 MG tablet, Take 12.5 mg by mouth 2 (two) times daily., Disp: , Rfl:    clonazePAM  (KLONOPIN ) 1 MG tablet, TAKE 1  TABLET BY MOUTH 2 TIMES DAILY AS NEEDED FOR ANXIETY., Disp: 60 tablet, Rfl: 2   DULoxetine  (CYMBALTA ) 60 MG capsule, Take 1 capsule (60 mg total) by mouth daily., Disp: 30 capsule, Rfl: 11   fluticasone  (FLONASE ) 50 MCG/ACT nasal spray, Place 2 sprays into both nostrils daily., Disp: 16 g, Rfl: 0   meloxicam  (MOBIC ) 15 MG tablet, Take 1 tablet (15 mg total) by mouth daily as needed for pain., Disp: 90 tablet, Rfl: 1   methocarbamol  (ROBAXIN ) 500 MG tablet, Take 2 tablets (1,000 mg total) by mouth 4 (four) times daily., Disp: 30 tablet, Rfl: 0   mometasone  (ELOCON ) 0.1 % ointment, Apply topically daily., Disp: 45 g, Rfl: 0   naproxen  (NAPROSYN ) 500 MG tablet, Take 1 tablet (500 mg total) by mouth 2 (two) times daily., Disp: 20 tablet, Rfl: 0   oxyCODONE  (OXY IR/ROXICODONE ) 5 MG immediate release tablet, Take 1 tablet (5 mg total) by mouth every 6 (six) hours as needed for severe pain (pain score 7-10)., Disp: 4 tablet, Rfl: 0   pimecrolimus  (ELIDEL ) 1 % cream, Apply topically 2 (two) times daily., Disp: 60 g, Rfl: 4   Plecanatide  (TRULANCE ) 3 MG TABS, 1 tab by mouth once daily, Disp: 30 tablet, Rfl: 11   Rimegepant Sulfate (NURTEC) 75 MG TBDP, Take 1 tablet (75 mg total) by mouth daily as needed., Disp: 15 tablet, Rfl: 1  Roflumilast  (ZORYVE ) 0.3 % CREA, Use as directed to affected area once daily, Disp: 60 g, Rfl: 2   rosuvastatin  (CRESTOR ) 10 MG tablet, TAKE 1 TABLET BY MOUTH EVERY DAY, Disp: 90 tablet, Rfl: 2   solifenacin  (VESICARE ) 5 MG tablet, Take 1 tablet (5 mg total) by mouth daily., Disp: 90 tablet, Rfl: 3   spironolactone (ALDACTONE) 50 MG tablet, Take 50 mg by mouth daily., Disp: , Rfl:    triamcinolone  cream (KENALOG ) 0.1 %, Apply 1 Application topically 2 (two) times daily., Disp: 45 g, Rfl: 2   Objective:     There were no vitals filed for this visit.    There is no height or weight on file to calculate BMI.    Physical Exam:    ***   Electronically signed by:  Kristie Nelson  D.[facility] 7:33 AM 06/07/23

## 2023-06-07 NOTE — Progress Notes (Signed)
 Denies allergies to eggs or soy products. Denies complication of anesthesia or sedation. Denies use of weight loss medication. Denies use of O2.   Emmi instructions given for colonoscopy.

## 2023-06-08 ENCOUNTER — Ambulatory Visit: Admitting: Sports Medicine

## 2023-06-15 ENCOUNTER — Encounter: Admitting: Physical Therapy

## 2023-06-19 NOTE — Progress Notes (Signed)
 [facility] History and Physical   Primary Care Physician:  Norleen Lynwood ORN, MD   Reason for Procedure:  Colon cancer screening  Plan:    Colonoscopy     HPI: Kristie Nelson is a 43 y.o. female with a history of a posterior anal fissure, presenting for initial screening colonoscopy.   Past Medical History:  Diagnosis Date   ALLERGIC RHINITIS 04/03/2007   Allergy     Anal fissure    ANXIETY 04/03/2007   DEPRESSION 04/03/2007   Dysrhythmia    palpitations due to stress   ECZEMA 03/19/2009   Heartburn in pregnancy    Hemorrhoids    HYPERLIPIDEMIA 12/09/2009   HYPERTENSION 04/03/2007   MIGRAINE, COMMON 12/09/2009   Postoperative anemia due to acute blood loss 08/30/2012   Shortness of breath    with anxiety    Past Surgical History:  Procedure Laterality Date   CESAREAN SECTION  2010   CESAREAN SECTION N/A 08/29/2012   Procedure: Repeat CESAREAN SECTION;  Surgeon: Charlie JINNY Flowers, MD;  Location: [facility];  Service: Obstetrics;  Laterality: N/A;  EDD: 09/17/12;REQUEST DEE;[name]    Prior to Admission medications   Medication Sig Start Date End Date Taking? Authorizing Provider  benzonatate  (TESSALON ) 200 MG capsule Take 1 capsule (200 mg total) by mouth 2 (two) times daily as needed for cough. 03/06/23   Sagardia, Miguel Jose, MD  carvedilol (COREG) 12.5 MG tablet Take 12.5 mg by mouth 2 (two) times daily. 06/17/21   [provider]  clonazePAM  (KLONOPIN ) 1 MG tablet TAKE 1 TABLET BY MOUTH 2 TIMES DAILY AS NEEDED FOR ANXIETY. 07/18/22   Norleen Lynwood ORN, MD  DULoxetine  (CYMBALTA ) 60 MG capsule Take 1 capsule (60 mg total) by mouth daily. 05/04/23 05/03/24  Norleen Lynwood ORN, MD  fluticasone  (FLONASE ) 50 MCG/ACT nasal spray Place 2 sprays into both nostrils daily. 03/04/23   Geiple, Joshua, PA-C  meloxicam  (MOBIC ) 15 MG tablet Take 1 tablet (15 mg total) by mouth daily as needed for pain. 03/21/23   Norleen Lynwood ORN, MD  methocarbamol  (ROBAXIN ) 500 MG tablet Take 2 tablets  (1,000 mg total) by mouth 4 (four) times daily. 04/11/23   Geiple, Joshua, PA-C  naproxen  (NAPROSYN ) 500 MG tablet Take 1 tablet (500 mg total) by mouth 2 (two) times daily. 04/11/23   Desiderio Chew, PA-C  oxyCODONE  (OXY IR/ROXICODONE ) 5 MG immediate release tablet Take 1 tablet (5 mg total) by mouth every 6 (six) hours as needed for severe pain (pain score 7-10). 04/11/23   Desiderio Chew, PA-C  Plecanatide  (TRULANCE ) 3 MG TABS 1 tab by mouth once daily 05/04/23   Norleen Lynwood ORN, MD  Rimegepant Sulfate (NURTEC) 75 MG TBDP Take 1 tablet (75 mg total) by mouth daily as needed. 04/06/22   Purcell Emil Schanz, MD  rosuvastatin  (CRESTOR ) 10 MG tablet TAKE 1 TABLET BY MOUTH EVERY DAY 08/08/22   Norleen Lynwood ORN, MD  solifenacin  (VESICARE ) 5 MG tablet Take 1 tablet (5 mg total) by mouth daily. Patient not taking: Reported on 06/07/2023 05/08/23   Norleen Lynwood ORN, MD  spironolactone (ALDACTONE) 50 MG tablet Take 50 mg by mouth daily.    [provider]  triamcinolone  cream (KENALOG ) 0.1 % Apply 1 Application topically 2 (two) times daily. 05/04/23   Norleen Lynwood ORN, MD    Current Outpatient Medications  Medication Sig Dispense Refill   carvedilol (COREG) 12.5 MG tablet Take 12.5 mg by mouth 2 (two) times daily.     clonazePAM  (KLONOPIN )  1 MG tablet TAKE 1 TABLET BY MOUTH 2 TIMES DAILY AS NEEDED FOR ANXIETY. 60 tablet 2   DULoxetine  (CYMBALTA ) 60 MG capsule Take 1 capsule (60 mg total) by mouth daily. 30 capsule 11   rosuvastatin  (CRESTOR ) 10 MG tablet TAKE 1 TABLET BY MOUTH EVERY DAY 90 tablet 2   solifenacin  (VESICARE ) 5 MG tablet Take 1 tablet (5 mg total) by mouth daily. 90 tablet 3   spironolactone (ALDACTONE) 50 MG tablet Take 50 mg by mouth daily.     fluticasone  (FLONASE ) 50 MCG/ACT nasal spray Place 2 sprays into both nostrils daily. 16 g 0   meloxicam  (MOBIC ) 15 MG tablet Take 1 tablet (15 mg total) by mouth daily as needed for pain. 90 tablet 1   methocarbamol  (ROBAXIN ) 500 MG tablet Take 2 tablets (1,000  mg total) by mouth 4 (four) times daily. 30 tablet 0   naproxen  (NAPROSYN ) 500 MG tablet Take 1 tablet (500 mg total) by mouth 2 (two) times daily. 20 tablet 0   Rimegepant Sulfate (NURTEC) 75 MG TBDP Take 1 tablet (75 mg total) by mouth daily as needed. 15 tablet 1   triamcinolone  cream (KENALOG ) 0.1 % Apply 1 Application topically 2 (two) times daily. 45 g 2   Current Facility-Administered Medications  Medication Dose Route Frequency Provider Last Rate Last Admin   0.9 %  sodium chloride  infusion  500 mL Intravenous Once Avram Lupita BRAVO, MD        Allergies as of 06/20/2023 - Review Complete 06/20/2023  Allergen Reaction Noted   Peanut-containing drug products Swelling    Dog epithelium Itching 03/17/2020   Latex Itching 08/27/2012   Penicillins Itching    Tomato (diagnostic) Itching and Swelling 03/17/2020    Family History  Problem Relation Age of Onset   Diabetes Mother    Hypertension Mother    Heart disease Mother    Hyperlipidemia Mother    Diabetes Father    Hypertension Father    Diabetes Brother    Hypertension Brother    Diabetes Maternal Grandmother    Hypertension Maternal Grandmother    Thyroid  disease Maternal Grandmother    Hyperlipidemia Maternal Grandmother    Liver cancer Maternal Grandfather        mets   Hypertension Maternal Grandfather    Other Neg Hx        hyperaldosteronism   Colon cancer Neg Hx    Esophageal cancer Neg Hx    Stomach cancer Neg Hx    Rectal cancer Neg Hx     Social History   Socioeconomic History   Marital status: Married    Spouse name: Not on file   Number of children: 2   Years of education: Masters   Highest education level: Not on file  Occupational History   Occupation: TEACHER    Employer: [employer]  Tobacco Use   Smoking status: Never   Smokeless tobacco: Never  Vaping Use   Vaping status: Never Used  Substance and Sexual Activity   Alcohol use: Yes    Comment: Socially   Drug use: No   Sexual  activity: Not on file  Other Topics Concern   Not on file  Social History Narrative   Right handed   Caffeine use: starbucks drink once weekly   Kindergarten teacher - [employer]   Married, 1 son 2010 and 1 daughter 2014   02/25/2016   Social Drivers of Corporate investment banker Strain: Not on [employer]  Insecurity: Not on file  Transportation Needs: Not on file  Physical Activity: Not on file  Stress: Not on file  Social Connections: Not on file  Intimate Partner Violence: Not on file    Review of Systems:  All other review of systems negative except as mentioned in the HPI.  Physical Exam: Vital signs BP 138/88   Pulse 84   Temp 98 F (36.7 C) (Temporal)   Ht 5' 2 (1.575 m)   Wt 158 lb (71.7 kg)   LMP 04/04/2023 (Exact Date)   SpO2 99%   BMI 28.90 kg/m   General:   Alert,  Well-developed, well-nourished, pleasant and cooperative in NAD Lungs:  Clear throughout to auscultation.   Heart:  Regular rate and rhythm; no murmurs, clicks, rubs,  or gallops. Abdomen:  Soft, nontender and nondistended. Normal bowel sounds.   Neuro/Psych:  Alert and cooperative. Normal mood and affect. A and O x 3   @Aimy Sweeting  CHARLENA Commander, MD, [facility] [phone] (pager) 06/20/2023 11:38 AM@

## 2023-06-20 ENCOUNTER — Ambulatory Visit (AMBULATORY_SURGERY_CENTER): Admitting: Internal Medicine

## 2023-06-20 ENCOUNTER — Encounter: Payer: Self-pay | Admitting: Internal Medicine

## 2023-06-20 VITALS — BP 131/81 | HR 81 | Temp 98.0°F | Resp 15 | Ht 62.0 in | Wt 158.0 lb

## 2023-06-20 DIAGNOSIS — Z1211 Encounter for screening for malignant neoplasm of colon: Secondary | ICD-10-CM | POA: Diagnosis present

## 2023-06-20 DIAGNOSIS — K644 Residual hemorrhoidal skin tags: Secondary | ICD-10-CM | POA: Diagnosis not present

## 2023-06-20 MED ORDER — SODIUM CHLORIDE 0.9 % IV SOLN: 500.0000 mL | Freq: Once | INTRAVENOUS | Status: DC

## 2023-06-20 NOTE — Op Note (Signed)
 [facility] Patient Name: Kristie Nelson Procedure Date: 06/20/2023 11:40 AM MRN: [id number] Endoscopist: Kenney Peacemaker , MD, [id number] Age: 43 Referring MD:  Date of Birth: 10/05/1980 Gender: Female Account #: [id number] Procedure:                Colonoscopy Indications:              Screening for colorectal malignant neoplasm, This                            is the patient's first colonoscopy Medicines:                Monitored Anesthesia Care Procedure:                Pre-Anesthesia Assessment:                           - Prior to the procedure, a History and Physical                            was performed, and patient medications and                            allergies were reviewed. The patient's tolerance of                            previous anesthesia was also reviewed. The risks                            and benefits of the procedure and the sedation                            options and risks were discussed with the patient.                            All questions were answered, and informed consent                            was obtained. Prior Anticoagulants: The patient has                            taken no anticoagulant or antiplatelet agents. ASA                            Grade Assessment: II - A patient with mild systemic                            disease. After reviewing the risks and benefits,                            the patient was deemed in satisfactory condition to                            undergo the procedure.  After obtaining informed consent, the colonoscope                            was passed under direct vision. Throughout the                            procedure, the patient's blood pressure, pulse, and                            oxygen saturations were monitored continuously. The                            Olympus Scope SN [id number] was introduced through the                            anus and advanced to  the the cecum, identified by                            appendiceal orifice and ileocecal valve. The                            colonoscopy was performed without difficulty. The                            patient tolerated the procedure well. The quality                            of the bowel preparation was good. The ileocecal                            valve, appendiceal orifice, and rectum were                            photographed. The bowel preparation used was SUPREP                            via split dose instruction. Scope In: 11:47:05 AM Scope Out: 11:59:33 AM Scope Withdrawal Time: 0 hours 8 minutes 10 seconds  Total Procedure Duration: 0 hours 12 minutes 28 seconds  Findings:                 Hemorrhoids were found on perianal exam.                           The exam was otherwise without abnormality on                            direct and retroflexion views. Complications:            No immediate complications. Estimated Blood Loss:     Estimated blood loss: none. Impression:               - Hemorrhoids found on perianal exam. Large  external hemorrhoids.                           - The examination of the colon and rectum was                            otherwise normal on direct and retroflexion views.                           - No specimens collected. Recommendation:           - Patient has a contact number available for                            emergencies. The signs and symptoms of potential                            delayed complications were discussed with the                            patient. Return to normal activities tomorrow.                            Written discharge instructions were provided to the                            patient.                           - Resume previous diet.                           - Continue present medications.                           - Repeat colonoscopy in 10 years for screening                             purposes. Kenney Peacemaker, MD 06/20/2023 12:05:08 PM This report has been signed electronically.

## 2023-06-20 NOTE — Patient Instructions (Addendum)
 No polyps or cancer were seen.  You do have large external hemorrhoids as you know.  Next routine colonoscopy or other screening test in 10 years - 2035.  I appreciate the opportunity to care for you. Kenney Peacemaker, MD, [facility]  Resume previous diet Continue present medications There were no colon polyps seen today!  You will need another screening colonoscopy in 10 years, you will receive a letter at that time when you are due for the procedure.   Please call us  at [phone] if you have a change in bowel habits, change in family history of colo-rectal cancer, rectal bleeding or other GI concern before that time.  handouts/information given for hemorrhoids  YOU HAD AN ENDOSCOPIC PROCEDURE TODAY AT THE [facility]:   Refer to the procedure report that was given to you for any specific questions about what was found during the examination.  If the procedure report does not answer your questions, please call your gastroenterologist to clarify.  If you requested that your care partner not be given the details of your procedure findings, then the procedure report has been included in a sealed envelope for you to review at your convenience later.  YOU SHOULD EXPECT: Some feelings of bloating in the abdomen. Passage of more gas than usual.  Walking can help get rid of the air that was put into your GI tract during the procedure and reduce the bloating. If you had a lower endoscopy (such as a colonoscopy or flexible sigmoidoscopy) you may notice spotting of blood in your stool or on the toilet paper. If you underwent a bowel prep for your procedure, you may not have a normal bowel movement for a few days.  Please Note:  You might notice some irritation and congestion in your nose or some drainage.  This is from the oxygen used during your procedure.  There is no need for concern and it should clear up in a day or so.  SYMPTOMS TO REPORT IMMEDIATELY:  Following lower endoscopy  (colonoscopy):  Excessive amounts of blood in the stool  Significant tenderness or worsening of abdominal pains  Swelling of the abdomen that is new, acute  Fever of 100F or higher For urgent or emergent issues, a gastroenterologist can be reached at any hour by calling [phone]. Do not use MyChart messaging for urgent concerns.   DIET:  We do recommend a small meal at first, but then you may proceed to your regular diet.  Drink plenty of fluids but you should avoid alcoholic beverages for 24 hours.  ACTIVITY:  You should plan to take it easy for the rest of today and you should NOT DRIVE or use heavy machinery until tomorrow (because of the sedation medicines used during the test).    FOLLOW UP: Our staff will call the number listed on your records the next business day following your procedure.  We will call around 7:15- 8:00 am to check on you and address any questions or concerns that you may have regarding the information given to you following your procedure. If we do not reach you, we will leave a message.     SIGNATURES/CONFIDENTIALITY: You and/or your care partner have signed paperwork which will be entered into your electronic medical record.  These signatures attest to the fact that that the information above on your After Visit Summary has been reviewed and is understood.  Full responsibility of the confidentiality of this discharge information lies with you and/or your care-partner.

## 2023-06-20 NOTE — Progress Notes (Signed)
 Pt's states no medical or surgical changes since previsit or office visit.

## 2023-06-20 NOTE — Progress Notes (Signed)
 Vss nad trans to pacu

## 2023-06-21 ENCOUNTER — Encounter: Payer: Self-pay | Admitting: Internal Medicine

## 2023-06-21 ENCOUNTER — Telehealth: Payer: Self-pay | Admitting: *Deleted

## 2023-06-21 MED ORDER — HYDROCORTISONE (PERIANAL) 2.5 % EX CREA: 1.0000 | TOPICAL_CREAM | Freq: Two times a day (BID) | CUTANEOUS | 1 refills | Status: AC

## 2023-06-21 NOTE — Telephone Encounter (Signed)
 No answer on  follow up call. Left message.

## 2023-06-22 ENCOUNTER — Encounter

## 2023-06-26 MED ORDER — HYDROCORTISONE ACETATE 25 MG RE SUPP: 25.0000 mg | Freq: Two times a day (BID) | RECTAL | 1 refills | Status: AC

## 2023-06-26 NOTE — Addendum Note (Signed)
 Addended by: NORLEEN LYNWOOD ORN on: 06/26/2023 11:48 AM   Modules accepted: Orders

## 2023-07-06 ENCOUNTER — Telehealth: Payer: Self-pay | Admitting: Internal Medicine

## 2023-07-06 NOTE — Telephone Encounter (Signed)
 The pt has not had but 1 BM since 6/17 colon.  She is passing gas and has returned to her normal diet.  She is taking fiber as directed.  She has been advised to do a bowel purge today with 6-8 doses of miralax and titrate as needed to maintain bowel habits thereafter.  She will call back with any questions or concerns

## 2023-07-06 NOTE — Telephone Encounter (Signed)
 Inbound call from patient stating she is severely constipated. States she has had a bowel movement once in one month. Requesting a call to discuss further and options to help. Please advise, thank you

## 2023-07-09 ENCOUNTER — Other Ambulatory Visit: Payer: Self-pay | Admitting: Internal Medicine

## 2023-07-11 ENCOUNTER — Encounter: Payer: Self-pay | Admitting: Sports Medicine

## 2023-07-17 ENCOUNTER — Other Ambulatory Visit: Payer: Self-pay | Admitting: Internal Medicine

## 2023-07-17 ENCOUNTER — Telehealth: Payer: Self-pay | Admitting: Internal Medicine

## 2023-07-17 NOTE — Telephone Encounter (Unsigned)
 Copied from CRM [id number]. Topic: Clinical - Medication Refill >> Jul 17, 2023  5:17 PM Deleta S wrote: Medication: rosuvastatin  10 mg  Has the patient contacted their pharmacy? Yes (Agent: If no, request that the patient contact the pharmacy for the refill. If patient does not wish to contact the pharmacy document the reason why and proceed with request.) (Agent: If yes, when and what did the pharmacy advise?)  This is the patient's preferred pharmacy:  [id number] - [place], [place] - [address] [place] [address] Phone: [phone] Fax: [phone]    Is this the correct pharmacy for this prescription? Yes If no, delete pharmacy and type the correct one.   Has the prescription been filled recently? No  Is the patient out of the medication? Yes  Has the patient been seen for an appointment in the last year OR does the patient have an upcoming appointment? Yes  Can we respond through MyChart? No  Agent: Please be advised that Rx refills may take up to 3 business days. We ask that you follow-up with your pharmacy.

## 2023-07-17 NOTE — Telephone Encounter (Unsigned)
 Copied from CRM [id number]. Topic: Clinical - Medication Refill >> Jul 17, 2023  5:16 PM Arm[place] J wrote: Medication: carvedilol (COREG) 12.5 MG tablet  Has the patient contacted their pharmacy? Yes (Agent: If no, request that the patient contact the pharmacy for the refill. If patient does not wish to contact the pharmacy document the reason why and proceed with request.) (Agent: If yes, when and what did the pharmacy advise?) Pharmacy calling for refill.  This is the patient's preferred pharmacy:  [id number] - [place], [place] - [address] [place] [address] Phone: [phone] Fax: [phone]  Is this the correct pharmacy for this prescription? Yes If no, delete pharmacy and type the correct one.   Has the prescription been filled recently? No  Is the patient out of the medication? Yes  Has the patient been seen for an appointment in the last year OR does the patient have an upcoming appointment? Yes  Can we respond through MyChart? Yes  Agent: Please be advised that Rx refills may take up to 3 business days. We ask that you follow-up with your pharmacy.

## 2023-07-31 ENCOUNTER — Encounter: Payer: Self-pay | Admitting: Internal Medicine

## 2023-07-31 MED ORDER — CYCLOBENZAPRINE HCL 5 MG PO TABS: 5.0000 mg | ORAL_TABLET | Freq: Three times a day (TID) | ORAL | 1 refills | Status: AC | PRN

## 2023-08-04 ENCOUNTER — Other Ambulatory Visit (HOSPITAL_COMMUNITY): Payer: Self-pay

## 2023-08-04 ENCOUNTER — Telehealth: Payer: Self-pay

## 2023-08-04 ENCOUNTER — Other Ambulatory Visit: Payer: Self-pay | Admitting: Emergency Medicine

## 2023-08-04 DIAGNOSIS — G43911 Migraine, unspecified, intractable, with status migrainosus: Secondary | ICD-10-CM

## 2023-08-04 NOTE — Telephone Encounter (Signed)
 Pharmacy Patient Advocate Encounter  Received notification from [facility] that Prior Authorization for Nurtec 75MG  dispersible tablets has been APPROVED from 08/04/23 to 08/03/24   PA #/Case ID/Reference #: [id number]

## 2023-08-04 NOTE — Telephone Encounter (Signed)
 Pharmacy Patient Advocate Encounter   Received notification from CoverMyMeds that prior authorization for Nurtec 75MG  dispersible tablets is required/requested.   Insurance verification completed.   The patient is insured through [facility] .   Per test claim: PA required; PA submitted to above mentioned insurance via CoverMyMeds Key/confirmation #/EOC [id number] Status is pending

## 2023-10-09 ENCOUNTER — Ambulatory Visit: Payer: Self-pay

## 2023-10-09 NOTE — Telephone Encounter (Signed)
 Patient's brother called with concerns of patient's not sleeping. Currently dealing with grandmother in the process of passing away and brother is concerned that patient isn't sleeping or handling it well. Patient called who verified that she hasn't slept much since Saturday. Patient endorses her anxiety has increased and her ability to manage her anxiety isn't the best. Patient states her anxiety medication isn't helping from what she can see. Patient is asking for a new prescription or an increase in her current medication. Asking for a follow up call.   FYI Only or Action Required?: Action required by provider: clinical question for provider.  Patient was last seen in primary care on 05/04/2023 by Norleen Lynwood ORN, MD.  Called Nurse Triage reporting Anxiety.  Symptoms began several days ago.  Interventions attempted: Prescription medications: Klonopin  and Rest, hydration, or home remedies.  Symptoms are: unchanged.  Triage Disposition: See Physician Within 24 Hours  Patient/caregiver understands and will follow disposition?: No, wishes to speak with PCP  Copied from CRM [id number]. Topic: Clinical - Medication Question >> Oct 09, 2023  4:15 PM Drema MATSU wrote: Reason for CRM: Patient brother wants to see if pcp can call patient in medication that will help her sleep. Patient grandmother is passing and she is having time with the grief. Reason for Disposition  Patient sounds very upset or troubled to the triager  Answer Assessment - Initial Assessment Questions 1. CONCERN: Did anything happen that prompted you to call today?      Grandmother is currently on comfort care.  2. ANXIETY SYMPTOMS: Can you describe how you (your loved one; patient) have been feeling? (e.g., tense, restless, panicky, anxious, keyed up, overwhelmed, sense of impending doom).      Overwhelmed and anxious 3. ONSET: How long have you been feeling this way? (e.g., hours, days, weeks)     Saturday 4. SEVERITY:  How would you rate the level of anxiety? (e.g., 0 - 10; or mild, moderate, severe).     moderate 5. FUNCTIONAL IMPAIRMENT: How have these feelings affected your ability to do daily activities? Have you had more difficulty than usual doing your normal daily activities? (e.g., getting better, same, worse; self-care, school, work, interactions)     More difficulty since patient's grandmother has moved into comfort care and is transitioning.  6. HISTORY: Have you felt this way before? Have you ever been diagnosed with an anxiety problem in the past? (e.g., generalized anxiety disorder, panic attacks, PTSD). If Yes, ask: How was this problem treated? (e.g., medicines, counseling, etc.)     yes 7. RISK OF HARM - SUICIDAL IDEATION: Do you ever have thoughts of hurting or killing yourself? If Yes, ask:  Do you have these feelings now? Do you have a plan on how you would do this?     no 8. TREATMENT:  What has been done so far to treat this anxiety? (e.g., medicines, relaxation strategies). What has helped?     Klonopin -patient is unsure if this medication is working for her 9. THERAPIST: Do you have a counselor or therapist? If Yes, ask: What is their name?     NA 10. POTENTIAL TRIGGERS: Do you drink caffeinated beverages (e.g., coffee, colas, teas), and how much daily? Do you drink alcohol or use any drugs? Have you started any new medicines recently?       NA 11. PATIENT SUPPORT: Who is with you now? Who do you live with? Do you have family or friends who you can talk to?  family 98. OTHER SYMPTOMS: Do you have any other symptoms? (e.g., feeling depressed, trouble concentrating, trouble sleeping, trouble breathing, palpitations or fast heartbeat, chest pain, sweating, nausea, or diarrhea)       Trouble sleeping  Protocols used: Anxiety and Panic Attack-A-AH

## 2023-10-10 ENCOUNTER — Ambulatory Visit: Payer: Self-pay

## 2023-10-10 NOTE — Telephone Encounter (Signed)
 FYI Only or Action Required?: FYI only for provider.  Patient was last seen in primary care on 05/04/2023 by Norleen Lynwood ORN, MD.  Called Nurse Triage reporting Anxiety.  Symptoms began several days ago.  Interventions attempted: Prescription medications: Clonazepam  1mg  2x daily prn.  Symptoms are: stable.  Triage Disposition: Information or Advice Only Call  Patient/caregiver understands and will follow disposition?: Yes Reason for Disposition  Health information question, no triage required and triager able to answer question  Answer Assessment - Initial Assessment Questions Patient states her grandmother is passing away, current medication is not helping and she is asking for an increase dose during this time. Please advise Call back number [phone]  1. REASON FOR CALL: What is the main reason for your call? or How can I best help you?     Called yesterday requesting an increase on anxiety medication  2. SYMPTOMS : Do you have any symptoms?      Increased anxiety due to grandmother passing away  Protocols used: Information Only Call - No Triage-A-AH  Copied from CRM #[id number]. Topic: Clinical - Medication Question >> Oct 10, 2023  4:32 PM Taleah C wrote: Reason for CRM: called in yesterday about anxiety medication, has not heard back since yesterday. Increased anxiety

## 2023-10-11 ENCOUNTER — Other Ambulatory Visit: Payer: Self-pay | Admitting: Internal Medicine

## 2023-10-11 DIAGNOSIS — Z636 Dependent relative needing care at home: Secondary | ICD-10-CM

## 2023-10-11 DIAGNOSIS — F419 Anxiety disorder, unspecified: Secondary | ICD-10-CM

## 2023-10-11 DIAGNOSIS — F32A Depression, unspecified: Secondary | ICD-10-CM

## 2023-10-11 MED ORDER — ZOLPIDEM TARTRATE 10 MG PO TABS: 10.0000 mg | ORAL_TABLET | Freq: Every evening | ORAL | 1 refills | Status: DC | PRN

## 2023-10-11 NOTE — Telephone Encounter (Signed)
 This was addressed yesterday.   I should not increase the clonazepam  1 mg bid prn as this is already a higher dose.   I did refer her to psychology for counseling for caregiver stress, anxiety and depression.  She may now need grief counseling and her mother Kristie Nelson has actually passed yesterday   Thanks

## 2023-10-11 NOTE — Telephone Encounter (Signed)
 Called and let Pt know

## 2023-10-11 NOTE — Telephone Encounter (Signed)
 Ok for ambien  10 mg at bedtime prn - done erx, also referral for counseling done

## 2023-10-12 NOTE — Telephone Encounter (Signed)
 Called & let Pt know.

## 2023-10-20 ENCOUNTER — Telehealth: Payer: Self-pay

## 2023-10-20 MED ORDER — ESZOPICLONE 3 MG PO TABS: 3.0000 mg | ORAL_TABLET | Freq: Every day | ORAL | 1 refills | Status: AC

## 2023-10-20 NOTE — Telephone Encounter (Signed)
 Copied from CRM #[id number]. Topic: Clinical - Medication Question >> Oct 20, 2023 11:21 AM Laymon HERO wrote: Reason for CRM: patient currently taking zolpidem  (AMBIEN ) 10 MG tablet- she said it is not working, wanting to know if something else can be sent in

## 2023-10-20 NOTE — Telephone Encounter (Signed)
 Ok for change to lunesta 3 mg - done erx

## 2023-10-23 ENCOUNTER — Ambulatory Visit: Admitting: Internal Medicine

## 2023-10-23 ENCOUNTER — Ambulatory Visit: Payer: Self-pay

## 2023-10-23 ENCOUNTER — Encounter: Payer: Self-pay | Admitting: Internal Medicine

## 2023-10-23 VITALS — BP 122/78 | HR 75 | Temp 98.9°F | Ht 62.0 in | Wt 165.0 lb

## 2023-10-23 DIAGNOSIS — F321 Major depressive disorder, single episode, moderate: Secondary | ICD-10-CM

## 2023-10-23 DIAGNOSIS — F4321 Adjustment disorder with depressed mood: Secondary | ICD-10-CM

## 2023-10-23 DIAGNOSIS — I10 Essential (primary) hypertension: Secondary | ICD-10-CM | POA: Diagnosis not present

## 2023-10-23 DIAGNOSIS — R7303 Prediabetes: Secondary | ICD-10-CM | POA: Diagnosis not present

## 2023-10-23 DIAGNOSIS — F411 Generalized anxiety disorder: Secondary | ICD-10-CM | POA: Diagnosis not present

## 2023-10-23 DIAGNOSIS — G4700 Insomnia, unspecified: Secondary | ICD-10-CM | POA: Insufficient documentation

## 2023-10-23 DIAGNOSIS — F5104 Psychophysiologic insomnia: Secondary | ICD-10-CM | POA: Diagnosis not present

## 2023-10-23 MED ORDER — LORAZEPAM 0.5 MG PO TABS: 0.5000 mg | ORAL_TABLET | Freq: Two times a day (BID) | ORAL | 1 refills | Status: DC | PRN

## 2023-10-23 MED ORDER — DULOXETINE HCL 60 MG PO CPEP
60.0000 mg | ORAL_CAPSULE | Freq: Two times a day (BID) | ORAL | 11 refills | Status: AC
Start: 2023-10-23 — End: 2024-10-22

## 2023-10-23 NOTE — Assessment & Plan Note (Signed)
 Mild to mod, for lunesta 3 mg at bedtime prn,  to f/u any worsening symptoms or concerns

## 2023-10-23 NOTE — Telephone Encounter (Signed)
 Reason for Disposition  MODERATE anxiety (e.g., persistent or frequent anxiety symptoms; interferes with sleep, school, or work)  Answer Assessment - Initial Assessment Questions Pt states that she takes Cymbalta  for depression and clonazepam  for anxiety but doesn't feel like they are helping. She has been unable to sleep. She states she is grieving the death of her grandmother. She states she feel like she gets palpitations, chest pain, and dizziness when the episodes occur.    1. CONCERN: Did anything happen that prompted you to call today?      Grieving the death of her grandmother 2. ANXIETY SYMPTOMS: Can you describe how you (your loved one; patient) have been feeling? (e.g., tense, restless, panicky, anxious, keyed up, overwhelmed, sense of impending doom).      Palpitations, chest pain, lightheaded 3. ONSET: How long have you been feeling this way? (e.g., hours, days, weeks)     The past few days.  4. SEVERITY: How would you rate the level of anxiety? (e.g., 0 - 10; or mild, moderate, severe).     6 5. FUNCTIONAL IMPAIRMENT: How have these feelings affected your ability to do daily activities? Have you had more difficulty than usual doing your normal daily activities? (e.g., getting better, same, worse; self-care, school, work, interactions)      6. HISTORY: Have you felt this way before? Have you ever been diagnosed with an anxiety problem in the past? (e.g., generalized anxiety disorder, panic attacks, PTSD). If Yes, ask: How was this problem treated? (e.g., medicines, counseling, etc.)      7. RISK OF HARM - SUICIDAL IDEATION: Do you ever have thoughts of hurting or killing yourself? If Yes, ask:  Do you have these feelings now? Do you have a plan on how you would do this?     no 8. TREATMENT:  What has been done so far to treat this anxiety? (e.g., medicines, relaxation strategies). What has helped?     Cymbalta , clonazepam  9. THERAPIST: Do you have a  counselor or therapist? If Yes, ask: What is their name?     Referral to November  10. POTENTIAL TRIGGERS: Do you drink caffeinated beverages (e.g., coffee, colas, teas), and how much daily? Do you drink alcohol or use any drugs? Have you started any new medicines recently?       Death of grandmother 9. PATIENT SUPPORT: Who is with you now? Who do you live with? Do you have family or friends who you can talk to?        yes 12. OTHER SYMPTOMS: Do you have any other symptoms? (e.g., feeling depressed, trouble concentrating, trouble sleeping, trouble breathing, palpitations or fast heartbeat, chest pain, sweating, nausea, or diarrhea)       Palpitations, chest pain, trouble sleeping, dizziness  Protocols used: Anxiety and Panic Attack-A-AH

## 2023-10-23 NOTE — Assessment & Plan Note (Signed)
 Pt ok for increased cymbalta  60 bid, f/u counseling as planned, also FMLA form signed with out of work authorized from sept 29 to return to work Nov 15

## 2023-10-23 NOTE — Patient Instructions (Addendum)
 Ok to stop the clonazepam   Please take all new medication as prescribed  - the ativan twice per day as needed in the daytime  Please take all new medication as prescribed - the lunesta for sleep  Ok to increase the cymbalta  to 60 mg twice per day  Please continue all other medications as before, and refills have been done if requested.  Please have the pharmacy call with any other refills you may need.  Please keep your appointments with your specialists as you may have planned - counseling Nov 6  The FMLA form was signed today with being out of work sept 29 to Nov 18 2023  Please make an Appointment to return in 1 months, or sooner if needed

## 2023-10-23 NOTE — Assessment & Plan Note (Signed)
 For counseling as scheduled,  to f/u any worsening symptoms or concerns

## 2023-10-23 NOTE — Assessment & Plan Note (Signed)
 Lab Results  Component Value Date   HGBA1C 5.9 05/04/2023   Stable, pt to continue current medical treatment  - diet, wt control

## 2023-10-23 NOTE — Progress Notes (Signed)
 Patient ID: Kristie Nelson, female   DOB: 09-04-1980, 43 y.o.   MRN: [id number]        Chief Complaint: follow up anxiety, depression, grief, insomnia, htn, preDM       HPI:  Kristie Nelson is a 43 y.o. female here with c/o mod to severe worsening depressive symptoms without SI or HI, but associated with increased anxiety without panic,  insomnia, and grief with mothers recent passing.  Has counseling appt scheduled Nov 6, but has not been able to work since sept 29, and has FMLA to be signed today.  Pt denies chest pain, increased sob or doe, wheezing, orthopnea, PND, increased LE swelling, palpitations, dizziness or syncope.   Pt denies polydipsia, polyuria, or new focal neuro s/s.   Wt Readings from Last 3 Encounters:  10/23/23 165 lb (74.8 kg)  06/20/23 158 lb (71.7 kg)  06/07/23 160 lb (72.6 kg)   BP Readings from Last 3 Encounters:  10/23/23 122/78  06/20/23 131/81  05/11/23 122/60         Past Medical History:  Diagnosis Date   ALLERGIC RHINITIS 04/03/2007   Allergy     Anal fissure    ANXIETY 04/03/2007   DEPRESSION 04/03/2007   Dysrhythmia    palpitations due to stress   ECZEMA 03/19/2009   Heartburn in pregnancy    Hemorrhoids    HYPERLIPIDEMIA 12/09/2009   HYPERTENSION 04/03/2007   MIGRAINE, COMMON 12/09/2009   Postoperative anemia due to acute blood loss 08/30/2012   Shortness of breath    with anxiety   Past Surgical History:  Procedure Laterality Date   CESAREAN SECTION  2010   CESAREAN SECTION N/A 08/29/2012   Procedure: Repeat CESAREAN SECTION;  Surgeon: Charlie JINNY Flowers, MD;  Location: [facility];  Service: Obstetrics;  Laterality: N/A;  EDD: 09/17/12;REQUEST DEE;[name]    reports that she has never smoked. She has never used smokeless tobacco. She reports current alcohol use. She reports that she does not use drugs. family history includes Diabetes in her brother, father, maternal grandmother, and mother; Heart disease in her mother; Hyperlipidemia in her  maternal grandmother and mother; Hypertension in her brother, father, maternal grandfather, maternal grandmother, and mother; Liver cancer in her maternal grandfather; Thyroid  disease in her maternal grandmother. Allergies  Allergen Reactions   Peanut-Containing Drug Products Swelling    REACTION: throat swelling   Dog Epithelium Itching   Latex Itching    swelling   Penicillins Itching    REACTION: itching   Tomato (Diagnostic) Itching and Swelling   Current Outpatient Medications on File Prior to Visit  Medication Sig Dispense Refill   carvedilol (COREG) 12.5 MG tablet Take 12.5 mg by mouth 2 (two) times daily.     cyclobenzaprine  (FLEXERIL ) 5 MG tablet Take 1 tablet (5 mg total) by mouth 3 (three) times daily as needed. 90 tablet 1   eszopiclone 3 MG TABS Take 1 tablet (3 mg total) by mouth at bedtime. Take immediately before bedtime 30 tablet 1   fluticasone  (FLONASE ) 50 MCG/ACT nasal spray Place 2 sprays into both nostrils daily. 16 g 0   hydrocortisone  (ANUSOL -HC) 2.5 % rectal cream Place 1 Application rectally 2 (two) times daily. 30 g 1   hydrocortisone  (ANUSOL -HC) 25 MG suppository Place 1 suppository (25 mg total) rectally every 12 (twelve) hours. 12 suppository 1   meloxicam  (MOBIC ) 15 MG tablet Take 1 tablet (15 mg total) by mouth daily as needed for pain. 90 tablet 1   naproxen  (NAPROSYN )  500 MG tablet Take 1 tablet (500 mg total) by mouth 2 (two) times daily. 20 tablet 0   NURTEC 75 MG TBDP TAKE 1 TABLET (75 MG TOTAL) BY MOUTH DAILY AS NEEDED 15 tablet 1   rosuvastatin  (CRESTOR ) 10 MG tablet TAKE 1 TABLET BY MOUTH EVERY DAY 90 tablet 2   solifenacin  (VESICARE ) 5 MG tablet Take 1 tablet (5 mg total) by mouth daily. 90 tablet 3   spironolactone (ALDACTONE) 50 MG tablet Take 50 mg by mouth daily.     triamcinolone  cream (KENALOG ) 0.1 % Apply 1 Application topically 2 (two) times daily. 45 g 2   No current facility-administered medications on file prior to visit.        ROS:   All others reviewed and negative.  Objective        PE:  BP 122/78 (BP Location: Right Arm, Patient Position: Sitting, Cuff Size: Normal)   Pulse 75   Temp 98.9 F (37.2 C) (Oral)   Ht 5' 2 (1.575 m)   Wt 165 lb (74.8 kg)   SpO2 98%   BMI 30.18 kg/m                 Constitutional: Pt appears in NAD               HENT: Head: NCAT.                Right Ear: External ear normal.                 Left Ear: External ear normal.                Eyes: . Pupils are equal, round, and reactive to light. Conjunctivae and EOM are normal               Nose: without d/c or deformity               Neck: Neck supple. Gross normal ROM               Cardiovascular: Normal rate and regular rhythm.                 Pulmonary/Chest: Effort normal and breath sounds without rales or wheezing.                Abd:  Soft, NT, ND, + BS, no organomegaly               Neurological: Pt is alert. At baseline orientation, motor grossly intact               Skin: Skin is warm. No rashes, no other new lesions, LE edema - none               Psychiatric: Pt behavior is normal without agitation , depressed affect  Micro: none  Cardiac tracings I have personally interpreted today:  none  Pertinent Radiological findings (summarize): none   Lab Results  Component Value Date   WBC 7.7 05/04/2023   HGB 12.2 05/04/2023   HCT 36.8 05/04/2023   PLT 297.0 05/04/2023   GLUCOSE 85 05/04/2023   CHOL 139 05/04/2023   TRIG 162.0 (H) 05/04/2023   HDL 42.90 05/04/2023   LDLDIRECT 127.0 07/11/2017   LDLCALC 64 05/04/2023   ALT 16 05/04/2023   AST 19 05/04/2023   NA 135 05/04/2023   K 4.0 05/04/2023   CL 103 05/04/2023   CREATININE 0.79 05/04/2023   BUN 15 05/04/2023  CO2 26 05/04/2023   TSH 0.98 05/04/2023   HGBA1C 5.9 05/04/2023   MICROALBUR <0.7 05/04/2023   Assessment/Plan:  Kristie Nelson is a 43 y.o. Black or African American [2] female with  has a past medical history of ALLERGIC RHINITIS (04/03/2007),  Allergy , Anal fissure, ANXIETY (04/03/2007), DEPRESSION (04/03/2007), Dysrhythmia, ECZEMA (03/19/2009), Heartburn in pregnancy, Hemorrhoids, HYPERLIPIDEMIA (12/09/2009), HYPERTENSION (04/03/2007), MIGRAINE, COMMON (12/09/2009), Postoperative anemia due to acute blood loss (08/30/2012), and Shortness of breath.  Prediabetes Lab Results  Component Value Date   HGBA1C 5.9 05/04/2023   Stable, pt to continue current medical treatment  - diet, wt control   Essential hypertension BP Readings from Last 3 Encounters:  10/23/23 122/78  06/20/23 131/81  05/11/23 122/60   Stable, pt to continue medical treatment coreg 12.5 bid,    Moderate major depression (HCC) Pt ok for increased cymbalta  60 bid, f/u counseling as planned, also FMLA form signed with out of work authorized from sept 29 to return to work Nov 15  Anxiety state Klonopin  now working well - today for change to ativan 0.5 mg bid prn  Insomnia Mild to mod, for lunesta 3 mg at bedtime prn,  to f/u any worsening symptoms or concerns   Grief For counseling as scheduled,  to f/u any worsening symptoms or concerns  Followup: Return in about 4 weeks (around 11/20/2023).  Lynwood Rush, MD 10/23/2023 9:26 PM [facility]

## 2023-10-23 NOTE — Telephone Encounter (Signed)
 Copied from CRM #[id number]. Topic: Clinical - Red Word Triage >> Oct 23, 2023 10:03 AM Burnard DEL wrote: Red Word that prompted transfer to Nurse Triage: anxiety,chest pains,heart palpatations

## 2023-10-23 NOTE — Telephone Encounter (Signed)
 FYI Only or Action Required?: FYI only for provider.  Patient was last seen in primary care on 05/04/2023 by Norleen Lynwood ORN, MD.  Called Nurse Triage reporting Anxiety.  Symptoms began several days ago.  Interventions attempted: Prescription medications: cymbalta , clonazepam .  Symptoms are: gradually worsening.  Triage Disposition: See PCP When Office is Open (Within 3 Days)  Patient/caregiver understands and will follow disposition?: Yes

## 2023-10-23 NOTE — Assessment & Plan Note (Signed)
 Klonopin  now working well - today for change to ativan 0.5 mg bid prn

## 2023-10-23 NOTE — Assessment & Plan Note (Signed)
 BP Readings from Last 3 Encounters:  10/23/23 122/78  06/20/23 131/81  05/11/23 122/60   Stable, pt to continue medical treatment coreg 12.5 bid,

## 2023-10-24 NOTE — Telephone Encounter (Signed)
 Seen yesterday

## 2023-10-25 ENCOUNTER — Other Ambulatory Visit (HOSPITAL_COMMUNITY): Payer: Self-pay

## 2023-11-09 ENCOUNTER — Ambulatory Visit: Admitting: Psychology

## 2023-11-09 ENCOUNTER — Encounter: Payer: Self-pay | Admitting: Psychology

## 2023-11-09 DIAGNOSIS — F419 Anxiety disorder, unspecified: Secondary | ICD-10-CM

## 2023-11-09 DIAGNOSIS — F4321 Adjustment disorder with depressed mood: Secondary | ICD-10-CM | POA: Diagnosis not present

## 2023-11-09 DIAGNOSIS — F331 Major depressive disorder, recurrent, moderate: Secondary | ICD-10-CM

## 2023-11-09 NOTE — Progress Notes (Signed)
 Behavioral Health Treatment Plan   Name:Kristie Nelson   MRN: [id number]   Treatment Plan Development Date: 11/09/23   Strengths: Family and seeking support  Supports: Brother and Research Officer, Political Party of Needs: space to have support and talk about grief.   Treatment Level:outpt counseling  Client Treatment Preferences:weekly counseling to start.  Virtual or in person appointments   Diagnosis: Uncomplicated Bereavement (Normal Grief) Grief  Anxiety d/o unspecified MDD  Symptoms:  Feeling disbelief about the death, Intense emotional pain, Difficulty returning to normal life, Feeling that life is meaningless, and Loneliness and detachment from others  Goals:  Begin healthy grieving related to the loss.  Objectives: Target Date For All Objectives: 11/08/24  Verbalize feelings associated with the loss., Resolve feelings of guilt or regret associated with the loss., Share positive attributes and memories about the loss and how these things may be remembered., and Resume regular activities with family and friends.  Progress Documentation:  Progressing  Interventions:  Cognitive Behavioral Therapy, Mindfulness Meditation, Grief Therapy, and Supportive   Expected duration of treatment: reevaluate after 1 year.  Party responsible for implementation of interventions: Pt and Counselor   This plan has been reviewed and created by the following participants: Pt and counselor   This plan will be reviewed at least every 12 months.   No, pending signature via MyChart.   Shakeera Rightmyer, [facility]                [place], LCMHC
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
## 2023-11-20 ENCOUNTER — Encounter: Payer: Self-pay | Admitting: Internal Medicine

## 2023-11-20 ENCOUNTER — Ambulatory Visit (INDEPENDENT_AMBULATORY_CARE_PROVIDER_SITE_OTHER): Admitting: Psychology

## 2023-11-20 DIAGNOSIS — F4321 Adjustment disorder with depressed mood: Secondary | ICD-10-CM | POA: Diagnosis not present

## 2023-11-20 DIAGNOSIS — F419 Anxiety disorder, unspecified: Secondary | ICD-10-CM

## 2023-11-20 DIAGNOSIS — F331 Major depressive disorder, recurrent, moderate: Secondary | ICD-10-CM
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
## 2023-11-20 NOTE — Progress Notes (Signed)
 Behavioral Health Treatment Plan   Name:Kristie Nelson   MRN: [id number]   Treatment Plan Development Date: 11/09/23   Strengths: Family and seeking support  Supports: Brother and Research Officer, Political Party of Needs: space to have support and talk about grief.   Treatment Level:outpt counseling  Client Treatment Preferences:weekly counseling to start.  Virtual or in person appointments   Diagnosis: Uncomplicated Bereavement (Normal Grief) Grief  Anxiety d/o unspecified MDD  Symptoms:  Feeling disbelief about the death, Intense emotional pain, Difficulty returning to normal life, Feeling that life is meaningless, and Loneliness and detachment from others  Goals:  Begin healthy grieving related to the loss.  Objectives: Target Date For All Objectives: 11/08/24  Verbalize feelings associated with the loss., Resolve feelings of guilt or regret associated with the loss., Share positive attributes and memories about the loss and how these things may be remembered., and Resume regular activities with family and friends.  Progress Documentation:  Progressing  Interventions:  Cognitive Behavioral Therapy, Mindfulness Meditation, Grief Therapy, and Supportive   Expected duration of treatment: reevaluate after 1 year.  Party responsible for implementation of interventions: Pt and Counselor   This plan has been reviewed and created by the following participants: Pt and counselor   This plan will be reviewed at least every 12 months.   Yes, please see patient chart for sign off.   Tishia Maestre, [facility]                       [place], LCMHC

## 2023-11-23 ENCOUNTER — Encounter: Payer: Self-pay | Admitting: Internal Medicine

## 2023-11-23 ENCOUNTER — Ambulatory Visit: Admitting: Internal Medicine

## 2023-11-23 VITALS — BP 124/78 | HR 79 | Temp 99.2°F | Ht 62.0 in | Wt 165.0 lb

## 2023-11-23 DIAGNOSIS — F5104 Psychophysiologic insomnia: Secondary | ICD-10-CM | POA: Diagnosis not present

## 2023-11-23 DIAGNOSIS — F321 Major depressive disorder, single episode, moderate: Secondary | ICD-10-CM | POA: Diagnosis not present

## 2023-11-23 DIAGNOSIS — I10 Essential (primary) hypertension: Secondary | ICD-10-CM

## 2023-11-23 DIAGNOSIS — F4321 Adjustment disorder with depressed mood: Secondary | ICD-10-CM | POA: Diagnosis not present

## 2023-11-23 NOTE — Assessment & Plan Note (Signed)
 Overall improved, pt to continue cymbalta  60 bid, and counseling as she has planned

## 2023-11-23 NOTE — Assessment & Plan Note (Signed)
 Improved but persistent difficulty getting to sleep in the evening, continue lunesta at bedtime prn

## 2023-11-23 NOTE — Assessment & Plan Note (Signed)
 Mild improved, pt to continue current med tx and counseling

## 2023-11-23 NOTE — Patient Instructions (Signed)
 Please continue all other medications as before, and refills have been done if requested.  Please have the pharmacy call with any other refills you may need.  Please continue your efforts at being more active, low cholesterol diet, and weight control  Please keep your appointments with your specialists as you may have planned - counseling as you have planned  You should be ok to return to work Dec 04 2023  Please make an Appointment to return in 6 months, or sooner if needed

## 2023-11-23 NOTE — Assessment & Plan Note (Signed)
 BP Readings from Last 3 Encounters:  11/23/23 124/78  10/23/23 122/78  06/20/23 131/81   Stable, pt to continue medical treatment coreg 12.5 bid, aldactone 50 qd

## 2023-11-23 NOTE — Progress Notes (Signed)
 Patient ID: Kristie Nelson, female   DOB: [birth year], 43 y.o.   MRN: [id number]        Chief Complaint: follow up major depression, anxiety, insomnia, grief , htn       HPI:  Kristie Nelson is a 43 y.o. female here to f/u overall doing quite much improved per pt after cymbalta  increased to 60 mg bid, and pt has been seen per counseling.   Denies worsening depressive symptoms, suicidal ideation, or panic; has ongoing anxiety, but overall stable with current med tx.  Pt denies chest pain, increased sob or doe, wheezing, orthopnea, PND, increased LE swelling, palpitations, dizziness or syncope.   Pt denies polydipsia, polyuria, or new focal neuro s/s.   Still difficult to get to sleep, but lunesta working well.  Pt ready to return to work Dec 1.         Wt Readings from Last 3 Encounters:  11/23/23 165 lb (74.8 kg)  10/23/23 165 lb (74.8 kg)  06/20/23 158 lb (71.7 kg)   BP Readings from Last 3 Encounters:  11/23/23 124/78  10/23/23 122/78  06/20/23 131/81         Past Medical History:  Diagnosis Date   ALLERGIC RHINITIS 04/03/2007   Allergy     Anal fissure    ANXIETY 04/03/2007   DEPRESSION 04/03/2007   Dysrhythmia    palpitations due to stress   ECZEMA 03/19/2009   Heartburn in pregnancy    Hemorrhoids    HYPERLIPIDEMIA 12/09/2009   HYPERTENSION 04/03/2007   MIGRAINE, COMMON 12/09/2009   Postoperative anemia due to acute blood loss 08/30/2012   Shortness of breath    with anxiety   Past Surgical History:  Procedure Laterality Date   CESAREAN SECTION  2010   CESAREAN SECTION N/A 08/29/2012   Procedure: Repeat CESAREAN SECTION;  Surgeon: Kristie JINNY Flowers, MD;  Location: [facility];  Service: Obstetrics;  Laterality: N/A;  EDD: 09/17/12;REQUEST DEE;Kristie Nelson    reports that she has never smoked. She has never used smokeless tobacco. She reports current alcohol use. She reports that she does not use drugs. family history includes Diabetes in her brother, father, maternal grandmother, and  mother; Heart disease in her mother; Hyperlipidemia in her maternal grandmother and mother; Hypertension in her brother, father, maternal grandfather, maternal grandmother, and mother; Liver cancer in her maternal grandfather; Thyroid  disease in her maternal grandmother. Allergies  Allergen Reactions   Peanut-Containing Drug Products Swelling    REACTION: throat swelling   Dog Epithelium Itching   Latex Itching    swelling   Penicillins Itching    REACTION: itching   Tomato (Diagnostic) Itching and Swelling   Current Outpatient Medications on File Prior to Visit  Medication Sig Dispense Refill   carvedilol (COREG) 12.5 MG tablet Take 12.5 mg by mouth 2 (two) times daily.     cyclobenzaprine  (FLEXERIL ) 5 MG tablet Take 1 tablet (5 mg total) by mouth 3 (three) times daily as needed. 90 tablet 1   DULoxetine  (CYMBALTA ) 60 MG capsule Take 1 capsule (60 mg total) by mouth 2 (two) times daily. 60 capsule 11   eszopiclone 3 MG TABS Take 1 tablet (3 mg total) by mouth at bedtime. Take immediately before bedtime 30 tablet 1   fluticasone  (FLONASE ) 50 MCG/ACT nasal spray Place 2 sprays into both nostrils daily. 16 g 0   hydrocortisone  (ANUSOL -HC) 2.5 % rectal cream Place 1 Application rectally 2 (two) times daily. 30 g 1   hydrocortisone  (ANUSOL -HC) 25 MG  suppository Place 1 suppository (25 mg total) rectally every 12 (twelve) hours. 12 suppository 1   LORazepam  (ATIVAN ) 0.5 MG tablet Take 1 tablet (0.5 mg total) by mouth 2 (two) times daily as needed for anxiety. 60 tablet 1   meloxicam  (MOBIC ) 15 MG tablet Take 1 tablet (15 mg total) by mouth daily as needed for pain. 90 tablet 1   naproxen  (NAPROSYN ) 500 MG tablet Take 1 tablet (500 mg total) by mouth 2 (two) times daily. 20 tablet 0   NURTEC 75 MG TBDP TAKE 1 TABLET (75 MG TOTAL) BY MOUTH DAILY AS NEEDED 15 tablet 1   rosuvastatin  (CRESTOR ) 10 MG tablet TAKE 1 TABLET BY MOUTH EVERY DAY 90 tablet 2   solifenacin  (VESICARE ) 5 MG tablet Take 1  tablet (5 mg total) by mouth daily. 90 tablet 3   spironolactone (ALDACTONE) 50 MG tablet Take 50 mg by mouth daily.     triamcinolone  cream (KENALOG ) 0.1 % Apply 1 Application topically 2 (two) times daily. 45 g 2   No current facility-administered medications on file prior to visit.        ROS:  All others reviewed and negative.  Objective        PE:  BP 124/78 (BP Location: Right Arm, Patient Position: Sitting, Cuff Size: Normal)   Pulse 79   Temp 99.2 F (37.3 C) (Oral)   Ht 5' 2 (1.575 m)   Wt 165 lb (74.8 kg)   SpO2 94%   BMI 30.18 kg/m                 Constitutional: Pt appears in NAD               HENT: Head: NCAT.                Right Ear: External ear normal.                 Left Ear: External ear normal.                Eyes: . Pupils are equal, round, and reactive to light. Conjunctivae and EOM are normal               Nose: without d/c or deformity               Neck: Neck supple. Gross normal ROM               Cardiovascular: Normal rate and regular rhythm.                 Pulmonary/Chest: Effort normal and breath sounds without rales or wheezing.                Abd:  Soft, NT, ND, + BS, no organomegaly               Neurological: Pt is alert. At baseline orientation, motor grossly intact               Skin: Skin is warm. No rashes, no other new lesions, LE edema - none               Psychiatric: Pt behavior is normal without agitation   Micro: none  Cardiac tracings I have personally interpreted today:  none  Pertinent Radiological findings (summarize): none   Lab Results  Component Value Date   WBC 7.7 05/04/2023   HGB 12.2 05/04/2023   HCT 36.8 05/04/2023   PLT 297.0 05/04/2023  GLUCOSE 85 05/04/2023   CHOL 139 05/04/2023   TRIG 162.0 (H) 05/04/2023   HDL 42.90 05/04/2023   LDLDIRECT 127.0 07/11/2017   LDLCALC 64 05/04/2023   ALT 16 05/04/2023   AST 19 05/04/2023   NA 135 05/04/2023   K 4.0 05/04/2023   CL 103 05/04/2023   CREATININE 0.79  05/04/2023   BUN 15 05/04/2023   CO2 26 05/04/2023   TSH 0.98 05/04/2023   HGBA1C 5.9 05/04/2023   MICROALBUR <0.7 05/04/2023   Assessment/Plan:  Kristie Nelson is a 43 y.o. Black or African American [2] female with  has a past medical history of ALLERGIC RHINITIS (04/03/2007), Allergy , Anal fissure, ANXIETY (04/03/2007), DEPRESSION (04/03/2007), Dysrhythmia, ECZEMA (03/19/2009), Heartburn in pregnancy, Hemorrhoids, HYPERLIPIDEMIA (12/09/2009), HYPERTENSION (04/03/2007), MIGRAINE, COMMON (12/09/2009), Postoperative anemia due to acute blood loss (08/30/2012), and Shortness of breath.  Essential hypertension BP Readings from Last 3 Encounters:  11/23/23 124/78  10/23/23 122/78  06/20/23 131/81   Stable, pt to continue medical treatment coreg 12.5 bid, aldactone 50 qd   Grief Mild improved, pt to continue current med tx and counseling  Insomnia Improved but persistent difficulty getting to sleep in the evening, continue lunesta at bedtime prn  Moderate major depression (HCC) Overall improved, pt to continue cymbalta  60 bid, and counseling as she has planned  Followup: Return in about 6 months (around 05/22/2024).  Lynwood Rush, MD 11/23/2023 12:14 PM [facility]

## 2023-11-27 ENCOUNTER — Ambulatory Visit: Admitting: Psychology

## 2023-12-03 ENCOUNTER — Encounter: Payer: Self-pay | Admitting: Internal Medicine

## 2023-12-04 ENCOUNTER — Ambulatory Visit: Admitting: Psychology

## 2023-12-04 DIAGNOSIS — F419 Anxiety disorder, unspecified: Secondary | ICD-10-CM

## 2023-12-04 DIAGNOSIS — F4321 Adjustment disorder with depressed mood: Secondary | ICD-10-CM

## 2023-12-04 DIAGNOSIS — F331 Major depressive disorder, recurrent, moderate: Secondary | ICD-10-CM | POA: Diagnosis not present

## 2023-12-04 NOTE — Progress Notes (Signed)
 [facility] Counselor/Therapist Progress Note  Patient ID: Kristie Nelson, MRN: [id number],    Date: 12/04/2023  Time Spent: 3:25pm-3:54pm  Treatment Type: Individual Therapy  Pt is seen for a virtual video visit via caregility.  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  Pt consents to virtual visit and is aware of limitations of such visits.    Reported Symptoms: anxious/on edge, tightness in chest.  Pt return to work today.    Mental Status Exam: Appearance:  Well Groomed     Behavior: Appropriate  Motor: Restlestness  Speech/Language:  Clear and Coherent and Normal Rate  Affect: Appropriate  Mood: anxious and depressed  Thought process: normal  Thought content:   WNL  Sensory/Perceptual disturbances:   WNL  Orientation: oriented to person, place, time/date, and situation  Attention: Good  Concentration: Good  Memory: WNL  Fund of knowledge:  Good  Insight:   Good  Judgment:  Good  Impulse Control: Good   Risk Assessment: Danger to Self:  No Self-injurious Behavior: No Danger to Others: No Duty to Warn:no Physical Aggression / Violence:No  Access to Firearms a concern: No  Gang Involvement:No   Subjective: counselor assessed pt current functioning per pt report. Processed w/pt stressors, emotions and grief.  Explored holidays and hosting family.  Reflected positive interactions w/ friends and family.  discussed return to work.  Led pt through groudning breath and how to utilize and practice for self.  Pt affect wnl.  Pt reports today was her first day back to work.  Pt reports taking a day at at time and new routine.  Pt reports that she had a good Thanksgiving and was able ot host family and enjoyed time together and grateful for help in preparing.  Pt reports that over weekend w/ friend when to college game and then impromptu visit to [facility].  Pt reports was very positive.  Pt reports stressors w/ her children and ex  through separation and communication. Pt participated in breath practice and reports was positive and ways she can practice for self.    Interventions: Cognitive Behavioral Therapy and Mindfulness Meditation  Diagnosis:Grief  Major depressive disorder, recurrent episode, moderate (HCC)  Anxiety disorder, unspecified type  Plan: pt to f/u w counseling in 1 week. Pt to f/u w/ PCP as scheduled this week.          BARBARANN APPL, LCMHC

## 2023-12-04 NOTE — Progress Notes (Signed)
 Behavioral Health Treatment Plan   Name:Kristie Nelson   MRN: [id number]   Treatment Plan Development Date: 11/09/23   Strengths: Family and seeking support  Supports: Brother and Research Officer, Political Party of Needs: space to have support and talk about grief.   Treatment Level:outpt counseling  Client Treatment Preferences:weekly counseling to start.  Virtual or in person appointments   Diagnosis: Uncomplicated Bereavement (Normal Grief) Grief  Anxiety d/o unspecified MDD  Symptoms:  Feeling disbelief about the death, Intense emotional pain, Difficulty returning to normal life, Feeling that life is meaningless, and Loneliness and detachment from others  Goals:  Begin healthy grieving related to the loss.  Objectives: Target Date For All Objectives: 11/08/24  Verbalize feelings associated with the loss., Resolve feelings of guilt or regret associated with the loss., Share positive attributes and memories about the loss and how these things may be remembered., and Resume regular activities with family and friends.  Progress Documentation:  Progressing  Interventions:  Cognitive Behavioral Therapy, Mindfulness Meditation, Grief Therapy, and Supportive   Expected duration of treatment: reevaluate after 1 year.  Party responsible for implementation of interventions: Pt and Counselor   This plan has been reviewed and created by the following participants: Pt and counselor   This plan will be reviewed at least every 12 months.   Yes, please see patient chart for sign off.   Tishia Maestre, [facility]                       [place], LCMHC

## 2023-12-08 ENCOUNTER — Encounter: Payer: Self-pay | Admitting: Internal Medicine

## 2023-12-08 DIAGNOSIS — F4321 Adjustment disorder with depressed mood: Secondary | ICD-10-CM

## 2023-12-08 DIAGNOSIS — F32A Depression, unspecified: Secondary | ICD-10-CM

## 2023-12-12 ENCOUNTER — Ambulatory Visit: Admitting: Psychology

## 2024-01-16 ENCOUNTER — Encounter: Payer: Self-pay | Admitting: Internal Medicine

## 2024-01-17 MED ORDER — CLONAZEPAM 0.5 MG PO TABS: 0.5000 mg | ORAL_TABLET | Freq: Two times a day (BID) | ORAL | 2 refills | Status: AC | PRN

## 2024-01-19 MED ORDER — CARVEDILOL 12.5 MG PO TABS: 12.5000 mg | ORAL_TABLET | Freq: Two times a day (BID) | ORAL | 2 refills | Status: AC

## 2024-01-19 NOTE — Addendum Note (Signed)
 Addended by: NORLEEN LYNWOOD ORN on: 01/19/2024 04:04 PM   Modules accepted: Orders

## 2024-03-04 ENCOUNTER — Ambulatory Visit: Admitting: Internal Medicine

## 2024-03-05 ENCOUNTER — Ambulatory Visit (HOSPITAL_COMMUNITY): Admitting: Family
